# Patient Record
Sex: Male | Born: 1945 | Race: White | Hispanic: No | Marital: Married | Smoking: Never smoker
Health system: Southern US, Community
[De-identification: ages and names within clinical notes are randomized; demographics above are authoritative.]

## PROBLEM LIST (undated history)

## (undated) DIAGNOSIS — M722 Plantar fascial fibromatosis: Secondary | ICD-10-CM

## (undated) DIAGNOSIS — Z8719 Personal history of other diseases of the digestive system: Secondary | ICD-10-CM

## (undated) DIAGNOSIS — Q5310 Unspecified undescended testicle, unilateral: Secondary | ICD-10-CM

## (undated) DIAGNOSIS — M2540 Effusion, unspecified joint: Secondary | ICD-10-CM

## (undated) DIAGNOSIS — M1990 Unspecified osteoarthritis, unspecified site: Secondary | ICD-10-CM

## (undated) DIAGNOSIS — T8859XA Other complications of anesthesia, initial encounter: Secondary | ICD-10-CM

## (undated) DIAGNOSIS — K219 Gastro-esophageal reflux disease without esophagitis: Secondary | ICD-10-CM

## (undated) DIAGNOSIS — I10 Essential (primary) hypertension: Secondary | ICD-10-CM

## (undated) DIAGNOSIS — E119 Type 2 diabetes mellitus without complications: Secondary | ICD-10-CM

## (undated) DIAGNOSIS — I493 Ventricular premature depolarization: Secondary | ICD-10-CM

## (undated) DIAGNOSIS — M2550 Pain in unspecified joint: Secondary | ICD-10-CM

## (undated) DIAGNOSIS — C61 Malignant neoplasm of prostate: Secondary | ICD-10-CM

## (undated) DIAGNOSIS — Z9889 Other specified postprocedural states: Secondary | ICD-10-CM

## (undated) DIAGNOSIS — R112 Nausea with vomiting, unspecified: Secondary | ICD-10-CM

## (undated) DIAGNOSIS — C4490 Unspecified malignant neoplasm of skin, unspecified: Secondary | ICD-10-CM

## (undated) DIAGNOSIS — Z87442 Personal history of urinary calculi: Secondary | ICD-10-CM

## (undated) DIAGNOSIS — T4145XA Adverse effect of unspecified anesthetic, initial encounter: Secondary | ICD-10-CM

## (undated) HISTORY — PX: KNEE ARTHROSCOPY: SUR90

## (undated) HISTORY — PX: OTHER SURGICAL HISTORY: SHX169

## (undated) HISTORY — PX: MOHS SURGERY: SUR867

## (undated) HISTORY — PX: MELANOMA EXCISION: SHX5266

## (undated) HISTORY — PX: SHOULDER ARTHROSCOPY: SHX128

## (undated) HISTORY — PX: ROTATOR CUFF REPAIR: SHX139

## (undated) HISTORY — PX: HERNIA REPAIR: SHX51

---

## 2003-02-17 ENCOUNTER — Ambulatory Visit (HOSPITAL_COMMUNITY): Admission: RE | Admit: 2003-02-17 | Discharge: 2003-02-17 | Payer: Self-pay | Admitting: Orthopedic Surgery

## 2003-02-17 ENCOUNTER — Ambulatory Visit (HOSPITAL_BASED_OUTPATIENT_CLINIC_OR_DEPARTMENT_OTHER): Admission: RE | Admit: 2003-02-17 | Discharge: 2003-02-17 | Payer: Self-pay | Admitting: Orthopedic Surgery

## 2006-11-30 ENCOUNTER — Ambulatory Visit (HOSPITAL_BASED_OUTPATIENT_CLINIC_OR_DEPARTMENT_OTHER): Admission: RE | Admit: 2006-11-30 | Discharge: 2006-11-30 | Payer: Self-pay | Admitting: Orthopedic Surgery

## 2009-03-03 DIAGNOSIS — C4490 Unspecified malignant neoplasm of skin, unspecified: Secondary | ICD-10-CM

## 2009-03-03 HISTORY — DX: Unspecified malignant neoplasm of skin, unspecified: C44.90

## 2010-07-16 NOTE — Op Note (Signed)
Arthur Klein, Arthur Klein               ACCOUNT NO.:  [id number]   MEDICAL RECORD NO.:  [id number]          PATIENT TYPE:  AMB   LOCATION:  DSC                          FACILITY:  [facility]   PHYSICIAN:  Harvie Junior, M.D.   DATE OF BIRTH:  Nov 25, 1945   DATE OF PROCEDURE:  11/30/2006  DATE OF DISCHARGE:                               OPERATIVE REPORT   PREOPERATIVE DIAGNOSIS:  Impingement acromioclavicular joint arthritis  with questionable rotator cuff tear.   POSTOPERATIVE DIAGNOSES:  1. Rotator cuff tear.  2. Impingement.  3. Acromioclavicular joint arthritis.  4. High-grade partial tear of biceps tendon with labral pathology      anterior-superior.   PROCEDURES:  1. Mini-open rotator cuff repair of chronically torn rotator cuff      including acromioplasty.  2. Arthroscopic distal clavicle resection through an anterior portal.  3. Open biceps tenodesis.  4. Arthroscopic debridement of labral pathology and stump of biceps      tendons within the glenohumeral joint.   SURGEON:  Harvie Junior, M.D.   ASSISTANT:  Marshia Ly, P.A.   ANESTHESIA:  General.   BRIEF HISTORY:  Arthur Klein is a 65 year old male with a long history  of having had a previous right rotator cuff repair performed.  He is now  having left shoulder complaints and problems.  We have injected him  several times.  He was having continued complaints of pain and because  of continued complaints of pain in the shoulder, we ultimately discussed  treatment options and the felt that the most appropriate course of  action was going to be an evaluation arthroscopically and fixation as  needed.   DESCRIPTION OF PROCEDURE:  The patient was taken to the operating room  and after adequate anesthesia was obtained with general anesthetic, the  patient was placed supine on the operating table.  The left shoulder was  then prepped and draped in the usual sterile fashion.   Following this, the left shoulder had the  arthroscope placed and routine  examination revealed that there was obvious high-grade fray of the  biceps tendon, just distal to its insertion.  A probe was used and it  was at least 60% of the thickness of the biceps tendon.  At that point,  it was felt that biceps tenodesis was going to be the appropriate course  of action and the biceps tendon was then released within the  glenohumeral joint.  Arthur Klein was used to shave down the stump of the  biceps tendon, as well as the labral pathology, anterior to posterior.  Glenohumeral joint showed no significant arthritic change.  Rotator cuff  undersurface showed rotator cuff tear.  This was debrided back and  obviously was a full-thickness tear.   At this point, attention was turned out of the glenohumeral joint and  into the subacromial space, and anterolateral acromioplasty was  performed from the lateral and posterior compartment.  Distal clavicle  resection over 18 mm was performed from an anterior compartment.  A  thorough debridement of the synovium in the subacromial space was then  undertaken.  Rotator cuff was then evaluated from the topside and noted  to have a tear that was noted from the bottom side.   At this point, the arthroscopic portion of the case was abandoned and a  small lateral incision was made.  Subcutaneous tissue dissected down to  the level of the deltoid.  The deltoid was then divided in line with its  fibers and the rotator cuff tear was identified.  A greater tuberosity  was verified as well as along the articular margin, as well as over in  the area of the biceps tendon and the biceps tendon and rotator cuff  were repaired in a single block anterior to the anterior portion of the  leading edge of the supraspinatus and then 1 anchor placed in the mid  and posterior portion of the supraspinatus.  Excellent repair was  achieved.  There was some lateral cuff tendon, from which we were able  to pull laterally the  rotator cuff to get that held down and tied down  nicely.   At this point, the wound was copiously irrigated and suctioned dry.  The  arm was then put through a range of motion of about 50 degrees of  external rotation, 180 degrees of elevation, internal rotation was 90  degrees with the arm at the side.  At this point, the final check was  made of our repair, after putting through the range of motion and no  tendency towards weakness or tension.  At this point, the deltoid was  close with a 1-Vicryl running, the skin with 0 and 2-0 Vicryl and 3-0  Maxon pullout suture.  Benzoin and Steri-Strips were applied, sterile  compression dressing was applied.  The patient was taken to recovery,  where he was noted to be in satisfactory condition.  Estimated blood  loss for the procedure was none.      Harvie Junior, M.D.  Electronically Signed     JLG/MEDQ  D:  11/30/2006  T:  12/01/2006  Job:  [id number]

## 2010-07-19 NOTE — Op Note (Signed)
NAMEAVROM, ROBARTS                           ACCOUNT NO.:  [id number]   MEDICAL RECORD NO.:  [id number]                   PATIENT TYPE:  AMB   LOCATION:  DSC                                  FACILITY:  [facility]   PHYSICIAN:  Harvie Junior, M.D.                DATE OF BIRTH:  September 14, 1945   DATE OF PROCEDURE:  02/17/2003  DATE OF DISCHARGE:                                 OPERATIVE REPORT   PREOPERATIVE DIAGNOSIS:  Medial meniscal tear right knee.   POSTOPERATIVE DIAGNOSES:  1. Medial meniscal tear.  2. 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   PROCEDURE:  1. Partial posterior horn medical meniscectomy.  2. Debridement of 1 cm femoral condylar defect grade 4.  3. Debridement of patellofemoral degenerative joint disease.   SURGEON:  Harvie Junior, M.D.   ASSISTANT:  Marshia Ly, P.A.   ANESTHESIA:  General.   BRIEF HISTORY:  He is a 65 year old male with a long history of having  popping and catching in his right knee with pain on the medial side  predominately.  He is ultimately treated conservatively, but after failure  of conservative care is ultimately taken to the operating room for operative  knee arthroscopy.   DESCRIPTION OF PROCEDURE:  The patient was taken to the operating room and  after adequate anesthesia obtained with a general anesthetic the patient was  placed on the operating table and the right leg was prepped and draped in  the usual sterile fashion.  Following this routine arthroscopic examination  of the right knee revealed that there was an obvious posterior horn medial  meniscal tear.  This was debrided back to a smooth and stable rim, about 20%  of the posterior horn and of the meniscus was removed.   Following this, attention was turned towards the medial femoral condyle  which was noted to have some grade 3 changes.  This was debrided with a  suction shaver back to a smooth and  stable rim.   Attention was turned to the anterior cruciate which is normal.  Lateral side  showed the meniscus within normal limits.  There was a large, free flap of  articular cartilage.  This measured to be about 1 cm. There was a large free  flap of articular cartilage.  This measured to be about 1 cm square.  This  was debrided with a suction shaver back to a smooth and stable rim.   Following this, attention was turned up into the patellofemoral joint where  a minimal debridement was undertaken of the patella.  Attention was turned  back to the medial side where a final check was made for any loose or  fragmented pieces.  Seeing none, the knee was copiously irrigated and  suctioned dry.  The arthroscopic portal was  closed with a bandage.  A sterile compression  dressing was applied and the  patient was taken to the recovery room; and was noted to be in satisfactory  condition.   ESTIMATED BLOOD LOSS:  None.                                               Harvie Junior, M.D.    Ranae Plumber  D:  02/17/2003  T:  02/19/2003  Job:  [id number]

## 2010-12-12 LAB — POCT HEMOGLOBIN-HEMACUE: Operator id: 208731

## 2011-04-07 ENCOUNTER — Other Ambulatory Visit: Payer: Self-pay | Admitting: Orthopedic Surgery

## 2011-04-09 ENCOUNTER — Other Ambulatory Visit: Payer: Self-pay | Admitting: Orthopedic Surgery

## 2011-04-21 ENCOUNTER — Encounter (HOSPITAL_COMMUNITY): Payer: Self-pay

## 2011-04-25 ENCOUNTER — Other Ambulatory Visit (HOSPITAL_COMMUNITY): Payer: Self-pay

## 2011-04-28 ENCOUNTER — Encounter (HOSPITAL_COMMUNITY)
Admission: RE | Admit: 2011-04-28 | Discharge: 2011-04-28 | Disposition: A | Discharge status: Home / Self Care | Payer: 59 | Source: Ambulatory Visit | Attending: Orthopedic Surgery | Admitting: Orthopedic Surgery

## 2011-04-28 ENCOUNTER — Encounter (HOSPITAL_COMMUNITY): Payer: Self-pay

## 2011-04-28 ENCOUNTER — Other Ambulatory Visit: Payer: Self-pay

## 2011-04-28 HISTORY — DX: Personal history of other diseases of the digestive system: Z87.19

## 2011-04-28 HISTORY — DX: Pain in unspecified joint: M25.50

## 2011-04-28 HISTORY — DX: Other complications of anesthesia, initial encounter: T88.59XA

## 2011-04-28 HISTORY — DX: Adverse effect of unspecified anesthetic, initial encounter: T41.45XA

## 2011-04-28 HISTORY — DX: Essential (primary) hypertension: I10

## 2011-04-28 HISTORY — DX: Unspecified osteoarthritis, unspecified site: M19.90

## 2011-04-28 HISTORY — DX: Effusion, unspecified joint: M25.40

## 2011-04-28 LAB — URINALYSIS, ROUTINE W REFLEX MICROSCOPIC
Glucose, UA: NEGATIVE mg/dL
Hgb urine dipstick: NEGATIVE
Leukocytes, UA: NEGATIVE
Specific Gravity, Urine: 1.023 (ref 1.005–1.030)
Urobilinogen, UA: 0.2 mg/dL (ref 0.0–1.0)

## 2011-04-28 LAB — COMPREHENSIVE METABOLIC PANEL
Albumin: 4.4 g/dL (ref 3.5–5.2)
BUN: 10 mg/dL (ref 6–23)
Calcium: 10 mg/dL (ref 8.4–10.5)
Creatinine, Ser: 0.76 mg/dL (ref 0.50–1.35)
Total Protein: 8.2 g/dL (ref 6.0–8.3)

## 2011-04-28 LAB — TYPE AND SCREEN: Antibody Screen: NEGATIVE

## 2011-04-28 LAB — CBC
HCT: 46.5 % (ref 39.0–52.0)
MCHC: 35.9 g/dL (ref 30.0–36.0)
MCV: 85.5 fL (ref 78.0–100.0)
RDW: 13.2 % (ref 11.5–15.5)

## 2011-04-28 LAB — DIFFERENTIAL
Eosinophils Relative: 2 % (ref 0–5)
Lymphocytes Relative: 39 % (ref 12–46)
Lymphs Abs: 2.7 10*3/uL (ref 0.7–4.0)
Monocytes Absolute: 0.5 10*3/uL (ref 0.1–1.0)

## 2011-04-28 LAB — SURGICAL PCR SCREEN
MRSA, PCR: NEGATIVE
Staphylococcus aureus: NEGATIVE

## 2011-04-28 NOTE — Pre-Procedure Instructions (Signed)
20 Arthur Klein  04/28/2011   Your procedure is scheduled on:  Fri, Mar 1 @ 1040  Report to [facility] at 0830 AM.  Call this number if you have problems the morning of surgery: [phone]   Remember:   Do not eat food:After Midnight.  May have clear liquids: up to 4 Hours before arrival.(until 4:30 am)  Clear liquids include soda, tea, black coffee, apple or grape juice, broth.  Take these medicines the morning of surgery with A SIP OF WATER:    Do not wear jewelry, make-up or nail polish.  Do not wear lotions, powders, or perfumes. You may wear deodorant.  Do not shave 48 hours prior to surgery.  Do not bring valuables to the hospital.  Contacts, dentures or bridgework may not be worn into surgery.  Leave suitcase in the car. After surgery it may be brought to your room.  For patients admitted to the hospital, checkout time is 11:00 AM the day of discharge.   Patients discharged the day of surgery will not be allowed to drive home.  Name and phone number of your driver:   Special Instructions: CHG Shower Use Special Wash: 1/2 bottle night before surgery and 1/2 bottle morning of surgery.   Please read over the following fact sheets that you were given: Pain Booklet, Coughing and Deep Breathing, Blood Transfusion Information, Total Joint Packet, MRSA Information and Surgical Site Infection Prevention

## 2011-04-28 NOTE — Progress Notes (Signed)
Pt doesn't have a cardiologist and has never had an echo/stress test/heart cath 

## 2011-05-01 MED ORDER — POVIDONE-IODINE 7.5 % EX SOLN
Freq: Once | CUTANEOUS | Status: DC
  Filled 2011-05-01: qty 118

## 2011-05-01 MED ORDER — CEFAZOLIN SODIUM-DEXTROSE 2-3 GM-% IV SOLR
2.0000 g | INTRAVENOUS | Status: AC
  Administered 2011-05-02: 2 g via INTRAVENOUS
  Filled 2011-05-01: qty 50

## 2011-05-02 ENCOUNTER — Ambulatory Visit (HOSPITAL_COMMUNITY): Payer: 59 | Admitting: Anesthesiology

## 2011-05-02 ENCOUNTER — Encounter (HOSPITAL_COMMUNITY): Payer: Self-pay | Admitting: Anesthesiology

## 2011-05-02 ENCOUNTER — Encounter (HOSPITAL_COMMUNITY): Payer: Self-pay | Admitting: *Deleted

## 2011-05-02 ENCOUNTER — Inpatient Hospital Stay (HOSPITAL_COMMUNITY)
Admission: RE | Admit: 2011-05-02 | Discharge: 2011-05-05 | DRG: 470 | Disposition: A | Discharge status: Home Health | Payer: 59 | Source: Ambulatory Visit | Attending: Orthopedic Surgery | Admitting: Orthopedic Surgery

## 2011-05-02 ENCOUNTER — Encounter (HOSPITAL_COMMUNITY)
Admission: RE | Disposition: A | Discharge status: Home Health | Payer: Self-pay | Source: Ambulatory Visit | Attending: Orthopedic Surgery

## 2011-05-02 DIAGNOSIS — Z883 Allergy status to other anti-infective agents status: Secondary | ICD-10-CM

## 2011-05-02 DIAGNOSIS — Z888 Allergy status to other drugs, medicaments and biological substances status: Secondary | ICD-10-CM

## 2011-05-02 DIAGNOSIS — M1711 Unilateral primary osteoarthritis, right knee: Secondary | ICD-10-CM | POA: Diagnosis present

## 2011-05-02 DIAGNOSIS — M1710 Unilateral primary osteoarthritis, unspecified knee: Principal | ICD-10-CM | POA: Diagnosis present

## 2011-05-02 DIAGNOSIS — Z01812 Encounter for preprocedural laboratory examination: Secondary | ICD-10-CM

## 2011-05-02 HISTORY — PX: KNEE ARTHROPLASTY: SHX992

## 2011-05-02 SURGERY — ARTHROPLASTY, KNEE, TOTAL, USING IMAGELESS COMPUTER-ASSISTED NAVIGATION
Anesthesia: General | Site: Knee | Laterality: Right | Wound class: Clean

## 2011-05-02 MED ORDER — ONDANSETRON HCL 4 MG/2ML IJ SOLN: 4.0000 mg | Freq: Once | INTRAMUSCULAR | Status: DC | PRN

## 2011-05-02 MED ORDER — KETOROLAC TROMETHAMINE 0.5 % OP SOLN
OPHTHALMIC | Status: DC | PRN
  Administered 2011-05-02: 30 [drp] via OPHTHALMIC

## 2011-05-02 MED ORDER — WARFARIN SODIUM 7.5 MG PO TABS
7.5000 mg | ORAL_TABLET | Freq: Once | ORAL | Status: AC
  Administered 2011-05-02: 7.5 mg via ORAL
  Filled 2011-05-02 (×2): qty 1

## 2011-05-02 MED ORDER — MEPERIDINE HCL 25 MG/ML IJ SOLN: 6.2500 mg | INTRAMUSCULAR | Status: DC | PRN

## 2011-05-02 MED ORDER — HYDROMORPHONE 0.3 MG/ML IV SOLN: INTRAVENOUS | Status: DC

## 2011-05-02 MED ORDER — MORPHINE SULFATE 4 MG/ML IJ SOLN
0.0500 mg/kg | INTRAMUSCULAR | Status: DC | PRN
Start: 2011-05-02 — End: 2011-05-02

## 2011-05-02 MED ORDER — METHOCARBAMOL 500 MG PO TABS
500.0000 mg | ORAL_TABLET | Freq: Four times a day (QID) | ORAL | Status: DC | PRN
  Administered 2011-05-02 – 2011-05-05 (×6): 500 mg via ORAL
  Filled 2011-05-02 (×5): qty 1

## 2011-05-02 MED ORDER — NALOXONE HCL 0.4 MG/ML IJ SOLN: 0.4000 mg | INTRAMUSCULAR | Status: DC | PRN

## 2011-05-02 MED ORDER — DIPHENHYDRAMINE HCL 50 MG/ML IJ SOLN: 12.5000 mg | Freq: Four times a day (QID) | INTRAMUSCULAR | Status: DC | PRN

## 2011-05-02 MED ORDER — ALUM & MAG HYDROXIDE-SIMETH 200-200-20 MG/5ML PO SUSP: 30.0000 mL | ORAL | Status: DC | PRN

## 2011-05-02 MED ORDER — SODIUM CHLORIDE 0.9 % IJ SOLN: 9.0000 mL | INTRAMUSCULAR | Status: DC | PRN

## 2011-05-02 MED ORDER — FENTANYL CITRATE 0.05 MG/ML IJ SOLN: 50.0000 ug | INTRAMUSCULAR | Status: DC | PRN

## 2011-05-02 MED ORDER — CEFUROXIME SODIUM 1.5 G IJ SOLR
INTRAMUSCULAR | Status: DC | PRN
  Administered 2011-05-02: 1.5 g

## 2011-05-02 MED ORDER — ONDANSETRON HCL 4 MG PO TABS
4.0000 mg | ORAL_TABLET | Freq: Four times a day (QID) | ORAL | Status: DC | PRN
  Administered 2011-05-05: 4 mg via ORAL
  Filled 2011-05-02: qty 1

## 2011-05-02 MED ORDER — ACETAMINOPHEN 10 MG/ML IV SOLN
INTRAVENOUS | Status: DC | PRN
  Administered 2011-05-02: 1000 mg via INTRAVENOUS

## 2011-05-02 MED ORDER — FENTANYL CITRATE 0.05 MG/ML IJ SOLN
INTRAMUSCULAR | Status: DC | PRN
Start: 2011-05-02 — End: 2011-05-02
  Administered 2011-05-02 (×3): 50 ug via INTRAVENOUS
  Administered 2011-05-02: 100 ug via INTRAVENOUS

## 2011-05-02 MED ORDER — SODIUM CHLORIDE 0.9 % IR SOLN
Status: DC | PRN
  Administered 2011-05-02: 3000 mL
  Administered 2011-05-02: 1000 mL

## 2011-05-02 MED ORDER — FENTANYL CITRATE 0.05 MG/ML IJ SOLN
INTRAMUSCULAR | Status: AC
  Filled 2011-05-02: qty 2

## 2011-05-02 MED ORDER — HYDROMORPHONE HCL PF 1 MG/ML IJ SOLN
0.2500 mg | INTRAMUSCULAR | Status: DC | PRN
  Administered 2011-05-02: 0.5 mg via INTRAVENOUS

## 2011-05-02 MED ORDER — ZOLPIDEM TARTRATE 5 MG PO TABS: 5.0000 mg | ORAL_TABLET | Freq: Every evening | ORAL | Status: DC | PRN

## 2011-05-02 MED ORDER — LACTATED RINGERS IV SOLN
INTRAVENOUS | Status: DC
Start: 2011-05-02 — End: 2011-05-02
  Administered 2011-05-02: 09:00:00 via INTRAVENOUS

## 2011-05-02 MED ORDER — ARTIFICIAL TEARS OP OINT
TOPICAL_OINTMENT | OPHTHALMIC | Status: DC | PRN
  Administered 2011-05-02: 1 via OPHTHALMIC

## 2011-05-02 MED ORDER — ACETAMINOPHEN 10 MG/ML IV SOLN
1000.0000 mg | Freq: Four times a day (QID) | INTRAVENOUS | Status: AC
  Administered 2011-05-02 – 2011-05-03 (×3): 1000 mg via INTRAVENOUS
  Filled 2011-05-02 (×4): qty 100

## 2011-05-02 MED ORDER — DEXTROSE-NACL 5-0.45 % IV SOLN
INTRAVENOUS | Status: DC
  Administered 2011-05-02: 1000 mL via INTRAVENOUS

## 2011-05-02 MED ORDER — ACETAMINOPHEN 10 MG/ML IV SOLN
INTRAVENOUS | Status: AC
  Filled 2011-05-02: qty 100

## 2011-05-02 MED ORDER — FENTANYL CITRATE 0.05 MG/ML IJ SOLN
50.0000 ug | INTRAMUSCULAR | Status: DC | PRN
  Administered 2011-05-02: 100 ug via INTRAVENOUS

## 2011-05-02 MED ORDER — OXYCODONE HCL 5 MG PO TABS
5.0000 mg | ORAL_TABLET | ORAL | Status: DC | PRN
  Administered 2011-05-02: 10 mg via ORAL
  Administered 2011-05-04 (×2): 5 mg via ORAL
  Filled 2011-05-02 (×2): qty 1

## 2011-05-02 MED ORDER — PATIENT'S GUIDE TO USING COUMADIN BOOK
Freq: Once | Status: DC
  Filled 2011-05-02: qty 1

## 2011-05-02 MED ORDER — OXYCODONE HCL 5 MG PO TABS
ORAL_TABLET | ORAL | Status: AC
  Filled 2011-05-02: qty 2

## 2011-05-02 MED ORDER — MIDAZOLAM HCL 2 MG/2ML IJ SOLN
INTRAMUSCULAR | Status: AC
  Filled 2011-05-02: qty 2

## 2011-05-02 MED ORDER — LACTATED RINGERS IV SOLN
INTRAVENOUS | Status: DC | PRN
  Administered 2011-05-02 (×2): via INTRAVENOUS

## 2011-05-02 MED ORDER — VITAMIN D3 25 MCG (1000 UNIT) PO TABS
4000.0000 [IU] | ORAL_TABLET | Freq: Every day | ORAL | Status: DC
  Administered 2011-05-03 – 2011-05-05 (×3): 4000 [IU] via ORAL
  Filled 2011-05-02 (×4): qty 4

## 2011-05-02 MED ORDER — METHOCARBAMOL 100 MG/ML IJ SOLN
500.0000 mg | Freq: Four times a day (QID) | INTRAVENOUS | Status: DC | PRN
  Filled 2011-05-02: qty 5

## 2011-05-02 MED ORDER — MIDAZOLAM HCL 5 MG/5ML IJ SOLN
INTRAMUSCULAR | Status: DC | PRN
  Administered 2011-05-02: 2 mg via INTRAVENOUS

## 2011-05-02 MED ORDER — MORPHINE SULFATE 2 MG/ML IJ SOLN: 0.0500 mg/kg | INTRAMUSCULAR | Status: DC | PRN

## 2011-05-02 MED ORDER — DEXAMETHASONE SODIUM PHOSPHATE 4 MG/ML IJ SOLN
INTRAMUSCULAR | Status: DC | PRN
  Administered 2011-05-02: 8 mg via INTRAVENOUS

## 2011-05-02 MED ORDER — FERROUS SULFATE 325 (65 FE) MG PO TABS
325.0000 mg | ORAL_TABLET | Freq: Two times a day (BID) | ORAL | Status: DC
  Administered 2011-05-02 – 2011-05-04 (×5): 325 mg via ORAL
  Filled 2011-05-02 (×9): qty 1

## 2011-05-02 MED ORDER — DIPHENHYDRAMINE HCL 12.5 MG/5ML PO ELIX: 12.5000 mg | ORAL_SOLUTION | Freq: Four times a day (QID) | ORAL | Status: DC | PRN

## 2011-05-02 MED ORDER — WARFARIN VIDEO: Freq: Once | Status: DC

## 2011-05-02 MED ORDER — PROPOFOL 10 MG/ML IV EMUL
INTRAVENOUS | Status: DC | PRN
  Administered 2011-05-02: 200 mg via INTRAVENOUS

## 2011-05-02 MED ORDER — PHENYLEPHRINE HCL 10 MG/ML IJ SOLN
INTRAMUSCULAR | Status: DC | PRN
  Administered 2011-05-02 (×3): 40 ug via INTRAVENOUS

## 2011-05-02 MED ORDER — CEFAZOLIN SODIUM-DEXTROSE 2-3 GM-% IV SOLR
2.0000 g | Freq: Four times a day (QID) | INTRAVENOUS | Status: AC
  Administered 2011-05-02 – 2011-05-03 (×3): 2 g via INTRAVENOUS
  Filled 2011-05-02 (×5): qty 50

## 2011-05-02 MED ORDER — ONDANSETRON HCL 4 MG/2ML IJ SOLN: 4.0000 mg | Freq: Four times a day (QID) | INTRAMUSCULAR | Status: DC | PRN

## 2011-05-02 MED ORDER — PROMETHAZINE HCL 25 MG/ML IJ SOLN: 12.5000 mg | Freq: Four times a day (QID) | INTRAMUSCULAR | Status: DC | PRN

## 2011-05-02 MED ORDER — METHOCARBAMOL 500 MG PO TABS
ORAL_TABLET | ORAL | Status: AC
  Administered 2011-05-04: 500 mg via ORAL
  Filled 2011-05-02: qty 1

## 2011-05-02 MED ORDER — DOCUSATE SODIUM 100 MG PO CAPS
100.0000 mg | ORAL_CAPSULE | Freq: Two times a day (BID) | ORAL | Status: DC
  Administered 2011-05-02 – 2011-05-05 (×6): 100 mg via ORAL
  Filled 2011-05-02 (×9): qty 1

## 2011-05-02 MED ORDER — MIDAZOLAM HCL 2 MG/2ML IJ SOLN
1.0000 mg | INTRAMUSCULAR | Status: DC | PRN
  Administered 2011-05-02: 2 mg via INTRAVENOUS

## 2011-05-02 MED ORDER — BUPIVACAINE HCL (PF) 0.5 % IJ SOLN
INTRAMUSCULAR | Status: DC | PRN
  Administered 2011-05-02: 30 mL

## 2011-05-02 MED ORDER — METOCLOPRAMIDE HCL 5 MG/ML IJ SOLN
INTRAMUSCULAR | Status: DC | PRN
  Administered 2011-05-02: 10 mg via INTRAVENOUS

## 2011-05-02 MED ORDER — ONDANSETRON HCL 4 MG/2ML IJ SOLN
INTRAMUSCULAR | Status: DC | PRN
  Administered 2011-05-02: 4 mg via INTRAVENOUS

## 2011-05-02 MED ORDER — VITAMIN D 50 MCG (2000 UT) PO CAPS: 4000.0000 [IU] | ORAL_CAPSULE | Freq: Every day | ORAL | Status: DC

## 2011-05-02 SURGICAL SUPPLY — 64 items
BANDAGE ELASTIC 4 VELCRO ST LF (GAUZE/BANDAGES/DRESSINGS) ×3 IMPLANT
BANDAGE ELASTIC 6 VELCRO ST LF (GAUZE/BANDAGES/DRESSINGS) ×3 IMPLANT
BANDAGE ESMARK 6X9 LF (GAUZE/BANDAGES/DRESSINGS) ×2 IMPLANT
BENZOIN TINCTURE PRP APPL 2/3 (GAUZE/BANDAGES/DRESSINGS) ×3 IMPLANT
BLADE SAGITTAL 25.0X1.19X90 (BLADE) ×3 IMPLANT
BLADE SAW SAG 90X13X1.27 (BLADE) ×3 IMPLANT
BNDG ESMARK 6X9 LF (GAUZE/BANDAGES/DRESSINGS) ×3
BOWL SMART MIX CTS (DISPOSABLE) ×3 IMPLANT
CEMENT HV SMART SET (Cement) ×6 IMPLANT
CLOTH BEACON ORANGE TIMEOUT ST (SAFETY) ×3 IMPLANT
COVER BACK TABLE 24X17X13 BIG (DRAPES) IMPLANT
COVER SURGICAL LIGHT HANDLE (MISCELLANEOUS) ×3 IMPLANT
CUFF TOURNIQUET SINGLE 34IN LL (TOURNIQUET CUFF) ×3 IMPLANT
CUFF TOURNIQUET SINGLE 44IN (TOURNIQUET CUFF) IMPLANT
DRAPE EXTREMITY T 121X128X90 (DRAPE) ×3 IMPLANT
DRAPE U-SHAPE 47X51 STRL (DRAPES) ×3 IMPLANT
DRSG PAD ABDOMINAL 8X10 ST (GAUZE/BANDAGES/DRESSINGS) ×3 IMPLANT
DURAPREP 26ML APPLICATOR (WOUND CARE) ×3 IMPLANT
ELECT REM PT RETURN 9FT ADLT (ELECTROSURGICAL) ×3
ELECTRODE REM PT RTRN 9FT ADLT (ELECTROSURGICAL) ×2 IMPLANT
EVACUATOR 1/8 PVC DRAIN (DRAIN) ×3 IMPLANT
FACESHIELD LNG OPTICON STERILE (SAFETY) ×3 IMPLANT
GAUZE SPONGE 4X4 12PLY STRL LF (GAUZE/BANDAGES/DRESSINGS) ×3 IMPLANT
GAUZE XEROFORM 5X9 LF (GAUZE/BANDAGES/DRESSINGS) ×3 IMPLANT
GLOVE BIOGEL PI IND STRL 8 (GLOVE) ×4 IMPLANT
GLOVE BIOGEL PI INDICATOR 8 (GLOVE) ×2
GLOVE ECLIPSE 7.5 STRL STRAW (GLOVE) ×6 IMPLANT
GOWN PREVENTION PLUS LG XLONG (DISPOSABLE) IMPLANT
GOWN STRL NON-REIN LRG LVL3 (GOWN DISPOSABLE) ×3 IMPLANT
GOWN STRL REIN XL XLG (GOWN DISPOSABLE) ×6 IMPLANT
HANDPIECE INTERPULSE COAX TIP (DISPOSABLE) ×1
HOOD PEEL AWAY FACE SHEILD DIS (HOOD) ×9 IMPLANT
IMMOBILIZER KNEE 20 (SOFTGOODS)
IMMOBILIZER KNEE 20 THIGH 36 (SOFTGOODS) IMPLANT
IMMOBILIZER KNEE 22 UNIV (SOFTGOODS) ×3 IMPLANT
IMMOBILIZER KNEE 24 THIGH 36 (MISCELLANEOUS) IMPLANT
IMMOBILIZER KNEE 24 UNIV (MISCELLANEOUS)
KIT BASIN OR (CUSTOM PROCEDURE TRAY) ×3 IMPLANT
KIT ROOM TURNOVER OR (KITS) ×3 IMPLANT
MANIFOLD NEPTUNE II (INSTRUMENTS) ×3 IMPLANT
NEEDLE HYPO 25GX1X1/2 BEV (NEEDLE) IMPLANT
NS IRRIG 1000ML POUR BTL (IV SOLUTION) ×3 IMPLANT
PACK TOTAL JOINT (CUSTOM PROCEDURE TRAY) ×3 IMPLANT
PAD ARMBOARD 7.5X6 YLW CONV (MISCELLANEOUS) ×3 IMPLANT
PAD CAST 4YDX4 CTTN HI CHSV (CAST SUPPLIES) ×2 IMPLANT
PADDING CAST COTTON 4X4 STRL (CAST SUPPLIES) ×1
PADDING WEBRIL 4 STERILE (GAUZE/BANDAGES/DRESSINGS) ×3 IMPLANT
PADDING WEBRIL 6 STERILE (GAUZE/BANDAGES/DRESSINGS) ×3 IMPLANT
SET HNDPC FAN SPRY TIP SCT (DISPOSABLE) ×2 IMPLANT
SPONGE GAUZE 4X4 12PLY (GAUZE/BANDAGES/DRESSINGS) ×3 IMPLANT
STAPLER VISISTAT 35W (STAPLE) IMPLANT
STRIP CLOSURE SKIN 1/2X4 (GAUZE/BANDAGES/DRESSINGS) ×3 IMPLANT
SUCTION FRAZIER TIP 10 FR DISP (SUCTIONS) ×3 IMPLANT
SUT MON AB 3-0 SH 27 (SUTURE)
SUT MON AB 3-0 SH27 (SUTURE) IMPLANT
SUT VIC AB 0 CTB1 27 (SUTURE) ×6 IMPLANT
SUT VIC AB 1 CT1 27 (SUTURE) ×2
SUT VIC AB 1 CT1 27XBRD ANBCTR (SUTURE) ×4 IMPLANT
SUT VIC AB 2-0 CTB1 (SUTURE) ×6 IMPLANT
SYR CONTROL 10ML LL (SYRINGE) IMPLANT
TOWEL OR 17X24 6PK STRL BLUE (TOWEL DISPOSABLE) ×3 IMPLANT
TOWEL OR 17X26 10 PK STRL BLUE (TOWEL DISPOSABLE) ×3 IMPLANT
TRAY FOLEY CATH 14FR (SET/KITS/TRAYS/PACK) ×3 IMPLANT
WATER STERILE IRR 1000ML POUR (IV SOLUTION) ×9 IMPLANT

## 2011-05-02 NOTE — Progress Notes (Signed)
During block pt placed on continous heart monitor and pulse ox. Heart rate maintained rate 88, Pulse ox. 97% on 2 lpm oxygen via [place]. Monitor did not link VS

## 2011-05-02 NOTE — Preoperative (Signed)
Beta Blockers   Reason not to administer Beta Blockers:Not Applicable 

## 2011-05-02 NOTE — H&P (Signed)
PREOPERATIVE H&P  Chief Complaint: r. Knee pain  HPI: Arthur Klein is a 66 y.o. male who presents for evaluation of R. Knee pain. It has been present for greater than 1 year and has been worsening.He has bone on bone changes on x-ray and pervious knee scope shows large area of exposed bone.He has failed conservative measures. Pain is rated as moderate.  Past Medical History  Diagnosis Date  . Gout     hx of  . Hypertension     borderline HTNt  . Headache     occasionally  . Arthritis   . Joint pain   . Joint swelling   . H/O hiatal hernia   . Complication of anesthesia     doesn't wake up well,combative until wife talks with him   Past Surgical History  Procedure Date  . Rotator cuff repair     right  . Rotator cuff repair     left  . Knee arthroscopy     right  . Shoulder arthroscopy     left  . Melanoma excision     stage 3+ lower left lumbar region with axillary node removal  . 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 Marital Status: Married    Spouse Name: N/A    Number of Children: N/A  . Years of Education: N/A   Social History Main Topics  . Smoking status: None  . Smokeless tobacco: Current User    Types: Chew  . Alcohol Use: No  . Drug Use: No  . Sexually Active: Yes   Other Topics Concern  . None   Social History Narrative  . None   Family History  Problem Relation Age of Onset  . Anesthesia problems Neg Hx   . Hypotension Neg Hx   . Malignant hyperthermia Neg Hx   . Pseudochol deficiency Neg Hx    Allergies  Allergen Reactions  . Erythrocin Diarrhea  . Nsaids Other (See Comments)    Gi bleeding  . Other Other (See Comments)    Pain meds with a NSAID derivative   Prior to Admission medications   Medication Sig Start Date End Date Taking? Authorizing Provider  acetaminophen (TYLENOL) 500 MG tablet Take 1,000 mg by mouth every 6 (six) hours as needed. For pain   Yes Historical  Provider, MD  Bacillus Coagulans-Inulin (PROBIOTIC-PREBIOTIC) 1-250 BILLION-MG CAPS Take 1 capsule by mouth daily.   Yes Historical Provider, MD  Cholecalciferol (VITAMIN D) 2000 UNITS CAPS Take 4,000 Units by mouth daily.   Yes Historical Provider, MD  OVER THE COUNTER MEDICATION Take 1 tablet by mouth daily. Juice Plus   Yes Historical Provider, MD     Positive ROS: none  All other systems have been reviewed and were otherwise negative with the exception of those mentioned in the HPI and as above.  Physical Exam: Filed Vitals:   05/02/11 0742  BP: 145/89  Pulse: 77  Temp: 98.4 F (36.9 C)  Resp: 20    General: Alert, no acute distress Cardiovascular: No pedal edema Respiratory: No cyanosis, no use of accessory musculature GI: No organomegaly, abdomen is soft and non-tender Skin: No lesions in the area of chief complaint Neurologic: Sensation intact distally Psychiatric: Patient is competent for consent with normal mood and affect Lymphatic: No axillary or cervical lymphadenopathy  MUSCULOSKELETAL: r. Knee pain on rom/ no instability/ med joint line seevere pain to palp  Assessment/Plan: degenerative joint disease Plan for Procedure(s): TOTAL KNEE ARTHROPLASTY with computer assistance The risks benefits and alternatives were discussed with the patient including but not limited to the risks of nonoperative treatment, versus surgical intervention including infection, bleeding, nerve injury, malunion, nonunion, hardware prominence, hardware failure, need for hardware removal, blood clots, cardiopulmonary complications, morbidity, mortality, among others, and they were willing to proceed.  Predicted outcome is good, although there will be at least a six to nine month expected recovery.  Arthur Willmon L, MD 05/02/2011 9:14 AM

## 2011-05-02 NOTE — Op Note (Signed)
Arthur Klein, Arthur Klein NO.:  [id number]  MEDICAL RECORD NO.:  [id number]  LOCATION:  5001                         FACILITY:  [facility]  PHYSICIAN:  Harvie Junior, M.D.   DATE OF BIRTH:  12-09-1945  DATE OF PROCEDURE:  05/02/2011 DATE OF DISCHARGE:                              OPERATIVE REPORT   PREOPERATIVE DIAGNOSIS:  End-stage degenerative joint disease, right knee.  POSTOPERATIVE DIAGNOSIS:  End-stage degenerative joint disease, right knee.  PROCEDURE:  Right total knee replacement with a Sigma system, size 4 femur, size 5 tibia, 15 mm bridging bearing, and a 38 mm all- polyethylene patella.  SURGEON:  Harvie Junior, M.D.  ASSISTANT:  Marshia Ly, P.A.  ANESTHESIA:  General.  BRIEF HISTORY:  Mr. Sayres is a 66 year old male with a long history of having significant complaints of right knee pain.  He had been treated conservatively for a long period time and was evaluated and noted to have bone-on-bone changes and continued to have pain.  Because of continued complaints of pain, the patient was taken to the operating room after failure of all conservative care.  He had previous arthroscopy, failure of injection therapy, failure of viscosupplementation.  Because of failure of all this conservative care, he was ultimately taken to the operating room for right total knee replacement.  Because of his young age, we felt the computer assistance would be appropriate and this was chosen to be used preoperatively.  DESCRIPTION OF PROCEDURE:  The patient was taken to the operating room. After adequate anesthesia was obtained with general anesthetic, the patient was placed supine on the operating table.  He was prepped and draped in usual sterile fashion.  Following this, the leg was exsanguinated.  Blood pressure tourniquet was inflated to 350 mmHg. Following this, a midline incision was made in the subcutaneous tissue down to the level of extensor mechanism  and a medial parapatellar arthrotomy was undertaken.  Once this was completed, attention was turned to the right knee where anterior and posterior cruciates were removed, mediolateral meniscus, retropatellar fat pad, and synovium in the anterior aspect of the femur.  Once that was completed, attention was turned to the knee where 2 pins were placed in the tibia, 2 pins in the femur, and the computer assistance modules were placed.  Once this was done, the registration process was undertaken.  This adds 30 minutes to surgical procedure.  Once the registration process was completed, the tibia was then cut perpendicular to its long axis.  The femur was cut perpendicular to the anatomic axis and this was done under computer assistance with perfect neutral long alignment achieved at this point. Attention at this point was then turned towards the femur, which was sized to about a 4-1/2.  We felt that 5 was going to be the tibial side, but felt that 5 maybe too big in the flexion gap and at that point, I felt that a size 4 will be appropriate.  The size 4 was then chosen and the anterior-posterior cuts were made, chamfers and box.  Attention was turned to the tibia, sized to a 5, drilled and keeled.  The trial components were put in  place, size 5 tibia, size 4 femur, 15 mm bridging bearing was placed which gave a perfect neutral long alignment, gap balance.  Attention was turned to the patella, cut down to the level of 13 mm, and a 38 mm paddle was used and a 38 mm patella was put in place, perfect neutral long alignment at this point with perfect patellar tracking.  Once this was done, all trial components were removed.  The knee was then copiously and thoroughly lavaged and suctioned dry.  The knee was then dried thoroughly, and the final components were cemented into place, size 4 femur, size 5 tibia, 15 mm bridging bearing trial was placed, and a 38 mm all poly-patella was placed with a  clamp.  Once this was done, all excess bone cement was removed.  Cement was allowed to harden.  Once the cement was completely hardened, attention was turned towards the femur where the tourniquet was let down.  The trial poly was removed.  Bleeding was controlled in the back of the knee as well as throughout the knee.  The knee was again thoroughly irrigated and suctioned dry.  The final poly was placed.  Again alignment was checked with the computer and the computer assistance modules were removed.  At this point, perfect neutral long alignment and gap balance.  A medium Hemovac drain was placed.  The medial parapatellar arthrotomy was closed with 1 Vicryl running, skin with 0 and 2-0 Vicryl and 3-0 Monocryl subcuticular.  Benzoin and Steri-Strips were applied.  Sterile compressive dressing was applied.  The patient was taken to recovery room where he was noted to be in satisfactory condition.  Estimated blood loss for this procedure was less than 50 mL.     Harvie Junior, M.D.     Ranae Plumber  D:  05/02/2011  T:  05/02/2011  Job:  [id number]

## 2011-05-02 NOTE — Anesthesia Postprocedure Evaluation (Signed)
Anesthesia Post Note  Patient: Arthur Klein  Procedure(s) Performed: Procedure(s) (LRB): COMPUTER ASSISTED TOTAL KNEE ARTHROPLASTY (Right)  Anesthesia type: general  Patient location: PACU  Post pain: Pain level controlled  Post assessment: Patient's Cardiovascular Status Stable  Last Vitals:  Filed Vitals:   05/02/11 1225  BP: 168/94  Pulse: 111  Temp: 37 C  Resp: 12    Post vital signs: Reviewed and stable  Level of consciousness: sedated  Complications: No apparent anesthesia complications

## 2011-05-02 NOTE — Anesthesia Procedure Notes (Addendum)
Anesthesia Regional Block:  Femoral nerve block  Pre-Anesthetic Checklist: ,, timeout performed, Correct Patient, Correct Site, Correct Laterality, Correct Procedure, Correct Position, site marked, Risks and benefits discussed,  Surgical consent,  Pre-op evaluation,  At surgeon's request and post-op pain management  Laterality: Right and Lower  Prep: chloraprep       Needles:  Injection technique: Single-shot  Needle Type: Echogenic Needle     Needle Length: 9cm  Needle Gauge: 22 and 22 G    Additional Needles:  Procedures: ultrasound guided Femoral nerve block Narrative:  Start time: 05/02/2011 9:20 AM End time: 05/02/2011 9:30 AM Injection made incrementally with aspirations every 5 mL.  Performed by: Personally  Anesthesiologist: Sheldon Silvan, MD  Additional Notes: Marcaine 0.5% with EPI 1:200000  Femoral nerve block Procedure Name: LMA Insertion Date/Time: 05/02/2011 11:16 AM Performed by: Leona Singleton A. Patient Re-evaluated:Patient Re-evaluated prior to inductionOxygen Delivery Method: Circle system utilized Preoxygenation: Pre-oxygenation with 100% oxygen Intubation Type: IV induction LMA: LMA inserted LMA Size: 5.0 Number of attempts: 2 Placement Confirmation: positive ETCO2 and breath sounds checked- equal and bilateral Tube secured with: Tape Dental Injury: Teeth and Oropharynx as per pre-operative assessment

## 2011-05-02 NOTE — Anesthesia Preprocedure Evaluation (Addendum)
Anesthesia Evaluation  Patient identified by MRN, date of birth, ID band Patient awake    Reviewed: Allergy & Precautions, H&P , NPO status , Patient's Chart, lab work & pertinent test results  History of Anesthesia Complications (+) Emergence Delirium  Airway Mallampati: I TM Distance: >3 FB Neck ROM: Full    Dental  (+) Teeth Intact, Dental Advisory Given, Partial Lower and Caps   Pulmonary neg pulmonary ROS, Current Smoker (smokeless tobacco),  clear to auscultation        Cardiovascular Exercise Tolerance: Good neg cardio ROS Regular Normal    Neuro/Psych  Headaches, Negative Psych ROS   GI/Hepatic Neg liver ROS, hiatal hernia,   Endo/Other  Negative Endocrine ROS  Renal/GU negative Renal ROS     Musculoskeletal  (+) Arthritis -, Osteoarthritis,    Abdominal   Peds negative pediatric ROS (+)  Hematology negative hematology ROS (+)   Anesthesia Other Findings   Reproductive/Obstetrics negative OB ROS                          Anesthesia Physical Anesthesia Plan  ASA: II  Anesthesia Plan: General   Post-op Pain Management:    Induction: Intravenous  Airway Management Planned: LMA  Additional Equipment:   Intra-op Plan:   Post-operative Plan: Extubation in OR  Informed Consent: I have reviewed the patients History and Physical, chart, labs and discussed the procedure including the risks, benefits and alternatives for the proposed anesthesia with the patient or authorized representative who has indicated his/her understanding and acceptance.   Dental advisory given  Plan Discussed with: CRNA, Anesthesiologist and Surgeon  Anesthesia Plan Comments:         Anesthesia Quick Evaluation

## 2011-05-02 NOTE — Progress Notes (Signed)
Orthopedic Tech Progress Note Patient Details:  Arthur Klein 12-29-1945 [id number]  CPM Right Knee CPM Right Knee: On Right Knee Flexion (Degrees): 60  Right Knee Extension (Degrees): 0    [name] 05/02/2011, 2:49 PM

## 2011-05-02 NOTE — Brief Op Note (Signed)
05/02/2011  11:57 AM  PATIENT:  Arthur Klein  66 y.o. male  PRE-OPERATIVE DIAGNOSIS:  degenerative joint disease  POST-OPERATIVE DIAGNOSIS:  degenerative joint disease  PROCEDURE:  Procedure(s) (LRB): COMPUTER ASSISTED TOTAL KNEE ARTHROPLASTY (Right)  SURGEON:  Surgeon(s) and Role:    * Harvie Junior, MD - Primary  PHYSICIAN ASSISTANT:   ASSISTANTS: bethune    ANESTHESIA:   general  EBL:  Total I/O In: 1000 [I.V.:1000] Out: 100 [Urine:100]  BLOOD ADMINISTERED:none  DRAINS: (1) Hemovact drain(s) in the r.knee with  Suction Open   LOCAL MEDICATIONS USED:  NONE  SPECIMEN:  No Specimen  DISPOSITION OF SPECIMEN:  N/A  COUNTS:  YES  TOURNIQUET:  * Missing tourniquet times found for documented tourniquets in log:  22557 *  DICTATION: .Other Dictation: Dictation Number [id number]  PLAN OF CARE: Admit to inpatient   PATIENT DISPOSITION:  PACU - hemodynamically stable.   Delay start of Pharmacological VTE agent (>24hrs) due to surgical blood loss or risk of bleeding: no

## 2011-05-02 NOTE — Transfer of Care (Signed)
Immediate Anesthesia Transfer of Care Note  Patient: Arthur Klein  Procedure(s) Performed: Procedure(s) (LRB): COMPUTER ASSISTED TOTAL KNEE ARTHROPLASTY (Right)  Patient Location: PACU  Anesthesia Type: General  Level of Consciousness: awake, alert  and oriented  Airway & Oxygen Therapy: Patient Spontanous Breathing and Patient connected to nasal cannula oxygen  Post-op Assessment: Report given to PACU RN and Post -op Vital signs reviewed and stable  Post vital signs: Reviewed and stable  Complications: No apparent anesthesia complications

## 2011-05-02 NOTE — Progress Notes (Signed)
ANTICOAGULATION CONSULT NOTE - Initial Consult  Pharmacy Consult for coumadin Indication: DVT prophylaxis  Allergies  Allergen Reactions  . Erythrocin Diarrhea  . Nsaids Other (See Comments)    Gi bleeding  . Other Other (See Comments)    Pain meds with a NSAID derivative    Vital Signs: Temp: 98.7 F (37.1 C) (03/01 1445) Temp src: Oral (03/01 0742) BP: 162/94 mmHg (03/01 1445) Pulse Rate: 112  (03/01 1445)  Labs: No results found for this basename: HGB:2,HCT:3,PLT:3,APTT:3,LABPROT:3,INR:3,HEPARINUNFRC:3,CREATININE:3,CKTOTAL:3,CKMB:3,TROPONINI:3 in the last 72 hours CrCl is unknown because there is no height on file for the current visit.  Medical History: Past Medical History  Diagnosis Date  . Gout     hx of  . Hypertension     borderline HTNt  . Headache     occasionally  . Arthritis   . Joint pain   . Joint swelling   . H/O hiatal hernia   . Complication of anesthesia     doesn't wake up well,combative until wife talks with him    Assessment: 66 yo male s/p R TKA to start coumadin (last INR= 0.99 on 04/28/11)  Goal of Therapy:  INR= 2.0   Plan:  -Will give coumadin 7.5mg  x1 today -Daily INR -Begin education process  Benny Lennert 05/02/2011,3:02 PM

## 2011-05-03 LAB — BASIC METABOLIC PANEL
BUN: 13 mg/dL (ref 6–23)
Creatinine, Ser: 0.81 mg/dL (ref 0.50–1.35)
GFR calc Af Amer: >90 mL/min (ref >90)
GFR calc non Af Amer: >90 mL/min (ref >90)
Glucose, Bld: 269 mg/dL — ABNORMAL HIGH (ref 70–99)
Potassium: 4.1 mEq/L (ref 3.5–5.1)

## 2011-05-03 LAB — CBC
HCT: 34 % — ABNORMAL LOW (ref 39.0–52.0)
Hemoglobin: 11.8 g/dL — ABNORMAL LOW (ref 13.0–17.0)
MCHC: 34.7 g/dL (ref 30.0–36.0)
MCV: 86.3 fL (ref 78.0–100.0)
RDW: 13.2 % (ref 11.5–15.5)

## 2011-05-03 MED ORDER — WARFARIN VIDEO
Freq: Once | Status: AC
  Administered 2011-05-04: 12:00:00

## 2011-05-03 MED ORDER — WARFARIN SODIUM 7.5 MG PO TABS
7.5000 mg | ORAL_TABLET | Freq: Once | ORAL | Status: AC
  Administered 2011-05-03: 7.5 mg via ORAL
  Filled 2011-05-03: qty 1

## 2011-05-03 NOTE — Progress Notes (Signed)
PT BID treatment  05/03/11 1403  PT Visit Information  Last PT Received On 05/03/11  Precautions  Precautions Knee  Required Braces or Orthoses Yes  Knee Immobilizer On when out of bed or walking  Restrictions  Weight Bearing Restrictions Yes  RLE Weight Bearing WBAT  Bed Mobility  Supine to Sit 6: Modified independent (Device/Increase time)  Transfers  Sit to Stand 6: Modified independent (Device/Increase time)  Stand to Sit 6: Modified independent (Device/Increase time)  Ambulation/Gait  Ambulation/Gait Assistance 5: Supervision  Ambulation/Gait Assistance Details (indicate cue type and reason) cueing for speed/technique  Ambulation Distance (Feet) 150 Feet  Assistive device Rolling walker  Gait Pattern Step-to pattern  Total Joint Exercises  Ankle Circles/Pumps AROM;Both;10 reps;Supine  Quad Sets AROM;Right;10 reps;Supine  Gluteal Sets AROM;Both;10 reps;Supine  Short Arc Quad AROM;Right;10 reps;Supine  Heel Slides AAROM;Right;10 reps;Supine  Straight Leg Raises AROM;Right;10 reps;Supine  PT - End of Session  Equipment Utilized During Treatment Gait belt  Activity Tolerance Patient tolerated treatment well  Patient left in bed;in CPM;with call bell in reach;with family/visitor present  General  Behavior During Session [facility] for tasks performed  Cognition [facility] for tasks performed  PT - Assessment/Plan  Comments on Treatment Session Patient s/p Right TKR, progressing well.  Feel patient will be ready to discharge tomorrow as planned.  PT Plan Discharge plan remains appropriate;Frequency remains appropriate  PT Frequency 7X/week  Follow Up Recommendations Other (comment)  Equipment Recommended Rolling walker with 5" wheels  Acute Rehab PT Goals  PT Goal: Supine/Side to Sit - Progress Met  PT Goal: Sit to Stand - Progress Met  PT Goal: Stand to Sit - Progress Met  PT Goal: Ambulate - Progress Progressing toward goal   05/03/2011 Olivia Canter, [place] [phone]

## 2011-05-03 NOTE — Progress Notes (Signed)
  ANTICOAGULATION CONSULT NOTE - Follow Up Consult  Pharmacy Consult for Coumadin Indication: VTE prophylaxis  Allergies  Allergen Reactions  . Erythrocin Diarrhea  . Nsaids Other (See Comments)    Gi bleeding  . Other Other (See Comments)    Pain meds with a NSAID derivative    Patient Measurements: Height: 5\' 9"  (175.3 cm) Weight: 218 lb (98.884 kg) IBW/kg (Calculated) : 70.7  Heparin Dosing Weight:98.9 kg  Vital Signs: Temp: 97.9 F (36.6 C) (03/02 0512) BP: 145/81 mmHg (03/02 0512) Pulse Rate: 107  (03/02 0512)  Labs:  Basename 05/03/11 0500  HGB 11.8*  HCT 34.0*  PLT 146*  APTT --  LABPROT 14.4  INR 1.10  HEPARINUNFRC --  CREATININE 0.81  CKTOTAL --  CKMB --  TROPONINI --   Estimated Creatinine Clearance: 108.3 ml/min (by C-G formula based on Cr of 0.81).   Medications:  Prescriptions prior to admission  Medication Sig Dispense Refill  . acetaminophen (TYLENOL) 500 MG tablet Take 1,000 mg by mouth every 6 (six) hours as needed. For pain      . Bacillus Coagulans-Inulin (PROBIOTIC-PREBIOTIC) 1-250 BILLION-MG CAPS Take 1 capsule by mouth daily.      . Cholecalciferol (VITAMIN D) 2000 UNITS CAPS Take 4,000 Units by mouth daily.      Marland Kitchen OVER THE COUNTER MEDICATION Take 1 tablet by mouth daily. Juice Plus        Assessment: Assessment:  66 yo male s/p R TKA to start coumadin (last INR= 0.99 on 04/28/11)  05/03/11 INR 1.1  Goal of Therapy:  INR= 2.0   Plan:  -Will give coumadin 7.5mg  x1 today  -Daily INR  -Begin education process to be dced 3/3 or 3/4  Lucille Passy 05/03/2011,10:25 AM

## 2011-05-03 NOTE — Evaluation (Signed)
Physical Therapy Evaluation Patient Details Name: Arthur Klein MRN: [id number] DOB: [birth year] Today's Date: 05/03/2011  Problem List:  Patient Active Problem List  Diagnoses  . Osteoarthritis of right knee    Past Medical History:  Past Medical History  Diagnosis Date  . Gout     hx of  . Hypertension     borderline HTNt  . Headache     occasionally  . Arthritis   . Joint pain   . Joint swelling   . H/O hiatal hernia   . Complication of anesthesia     doesn't wake up well,combative until wife talks with him   Past Surgical History:  Past Surgical History  Procedure Date  . Rotator cuff repair     right  . Rotator cuff repair     left  . Knee arthroscopy     right  . Shoulder arthroscopy     left  . Melanoma excision     stage 3+ lower left lumbar region with axillary node removal  . 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  Patient motivated to discharge home in 1-2 days.  Patient will benefit from PT to increase independence and prepare for discharge home.   PT Recommendation/Assessment: Patient will need skilled PT in the acute care venue PT Problem List: Decreased strength;Decreased range of motion;Decreased mobility;Decreased knowledge of use of DME PT Therapy Diagnosis : Difficulty walking PT Plan PT Frequency: 7X/week PT Treatment/Interventions: DME instruction;Gait training;Stair training;Functional mobility training;Therapeutic activities;Therapeutic exercise PT Recommendation Follow Up Recommendations: Other (comment) (patient will recieve HHPT from daughter who is PT) Equipment Recommended: Rolling walker with 5" wheels;3 in 1 bedside comode PT Goals  Acute Rehab PT Goals PT Goal Formulation: With patient/family Time For Goal Achievement: 3 days Pt will go Supine/Side to Sit: with modified  independence;with HOB 0 degrees PT Goal: Supine/Side to Sit - Progress: Goal set today Pt will go Sit to Supine/Side: with modified independence;with HOB 0 degrees PT Goal: Sit to Supine/Side - Progress: Goal set today Pt will go Sit to Stand: with modified independence;with upper extremity assist PT Goal: Sit to Stand - Progress: Goal set today Pt will go Stand to Sit: with modified independence PT Goal: Stand to Sit - Progress: Goal set today Pt will Ambulate: >150 feet;with modified independence;with rolling walker PT Goal: Ambulate - Progress: Goal set today Pt will Go Up / Down Stairs: 1-2 stairs;with supervision;with least restrictive assistive device PT Goal: Up/Down Stairs - Progress: Goal set today  PT Evaluation Precautions/Restrictions  Precautions Precautions: Knee Precaution Booklet Issued: No Required Braces or Orthoses: Yes Knee Immobilizer: On when out of bed or walking Restrictions Weight Bearing Restrictions: Yes RLE Weight Bearing: Weight bearing as tolerated Prior Functioning  Home Living Lives With: Spouse Receives Help From:  (daughter is PT and will provide therapy post discharge) Type of Home: House Home Layout: One level Home Access: Stairs to enter Entrance Stairs-Rails: None Entrance Stairs-Number of Steps: 2 Home Adaptive Equipment: None Prior Function Level of Independence: Independent with gait;Independent with transfers Driving: Yes Cognition Cognition Arousal/Alertness: Awake/alert Overall Cognitive Status: Appears within functional limits for tasks assessed Orientation Level: Oriented X4 Sensation/Coordination   Extremity Assessment RLE Assessment RLE Assessment: Exceptions to [facility] RLE AROM (degrees) Overall AROM Right Lower Extremity: Deficits RLE Overall AROM Comments: 55 degrees flexion - limited by bandage RLE  Strength RLE Overall Strength: Deficits RLE Overall Strength Comments: WFL ankle and hip, able to straight leg raise and  long arc quad. LLE Assessment LLE Assessment: Within Functional Limits Mobility (including Balance) Bed Mobility Bed Mobility: Yes Supine to Sit: 6: Modified independent (Device/Increase time) Transfers Transfers: Yes Sit to Stand: 5: Supervision Sit to Stand Details (indicate cue type and reason): cueing for  hand placement Stand to Sit: 5: Supervision Stand to Sit Details: cueing for hand placement Ambulation/Gait Ambulation/Gait: Yes Ambulation/Gait Assistance: 5: Supervision Ambulation/Gait Assistance Details (indicate cue type and reason): cueing to slow down/pace, cueing for technique Ambulation Distance (Feet): 60 Feet Assistive device: Rolling walker Gait Pattern: Step-to pattern Stairs: No  Posture/Postural Control Posture/Postural Control: No significant limitations Exercise  Total Joint Exercises Ankle Circles/Pumps: AROM;Both;10 reps;Supine Quad Sets: AROM;Right;10 reps;Supine Long Arc Quad: AROM;Right;10 reps;Seated Knee Flexion: AAROM;Right;10 reps;Seated End of Session PT - End of Session Equipment Utilized During Treatment: Gait belt (pt reports MD said he may or may not need knee immobilizer) Activity Tolerance: Patient tolerated treatment well Patient left: in chair;with family/visitor present;with call bell in reach Nurse Communication: Mobility status for transfers;Mobility status for ambulation General Behavior During Session: [facility] for tasks performed Cognition: [facility] for tasks performed  Olivia Canter, [place] [phone] 05/03/2011, 11:46 AM

## 2011-05-03 NOTE — Progress Notes (Signed)
Subjective: 1 Day Post-Op Procedure(s) (LRB): COMPUTER ASSISTED TOTAL KNEE ARTHROPLASTY (Right) Patient reports pain as 2 on 0-10 scale.    Objective: Vital signs in last 24 hours: Temp:  [97.1 F (36.2 C)-98.8 F (37.1 C)] 97.9 F (36.6 C) (03/02 0512) Pulse Rate:  [88-130] 107  (03/02 0512) Resp:  [10-18] 18  (03/02 0512) BP: (121-168)/(70-100) 145/81 mmHg (03/02 0512) SpO2:  [92 %-100 %] 99 % (03/02 0512) Weight:  [98.884 kg (218 lb)] 98.884 kg (218 lb) (03/01 1715)  Intake/Output from previous day: 03/01 0701 - 03/02 0700 In: 3860 [P.O.:360; I.V.:3250; IV Piggyback:250] Out: 4025 [Urine:3650; Drains:300; Blood:75] Intake/Output this shift:     Basename 05/03/11 0500  HGB 11.8*    Basename 05/03/11 0500  WBC 14.8*  RBC 3.94*  HCT 34.0*  PLT 146*    Basename 05/03/11 0500  NA 135  K 4.1  CL 101  CO2 22  BUN 13  CREATININE 0.81  GLUCOSE 269*  CALCIUM 9.4    Basename 05/03/11 0500  LABPT --  INR 1.10    Neurologically intact ABD soft Neurovascular intact Sensation intact distally Intact pulses distally Dorsiflexion/Plantar flexion intact No cellulitis present Compartment soft  Assessment/Plan: 1 Day Post-Op Procedure(s) (LRB): COMPUTER ASSISTED TOTAL KNEE ARTHROPLASTY (Right) Advance diet Up with therapy Possible d/c tomorrow vs monday  Arthur Klein 05/03/2011, 8:59 AM

## 2011-05-04 LAB — CBC
HCT: 31.2 % — ABNORMAL LOW (ref 39.0–52.0)
MCH: 30 pg (ref 26.0–34.0)
MCV: 87.4 fL (ref 78.0–100.0)
Platelets: 129 10*3/uL — ABNORMAL LOW (ref 150–400)
RBC: 3.57 MIL/uL — ABNORMAL LOW (ref 4.22–5.81)
RDW: 13.8 % (ref 11.5–15.5)
WBC: 13 10*3/uL — ABNORMAL HIGH (ref 4.0–10.5)

## 2011-05-04 MED ORDER — WARFARIN SODIUM 2.5 MG PO TABS
2.5000 mg | ORAL_TABLET | Freq: Once | ORAL | Status: AC
  Administered 2011-05-04: 2.5 mg via ORAL
  Filled 2011-05-04: qty 1

## 2011-05-04 MED ORDER — WARFARIN SODIUM 5 MG PO TABS
5.0000 mg | ORAL_TABLET | Freq: Once | ORAL | Status: DC
  Filled 2011-05-04: qty 1

## 2011-05-04 MED ORDER — OXYCODONE HCL 20 MG PO TB12
20.0000 mg | ORAL_TABLET | Freq: Two times a day (BID) | ORAL | Status: DC
  Administered 2011-05-04 (×2): 20 mg via ORAL
  Filled 2011-05-04 (×2): qty 1

## 2011-05-04 NOTE — Progress Notes (Signed)
Physical Therapy Treatment Patient Details Name: Arthur Klein MRN: [id number] DOB: 10/19/1945 Today's Date: 05/04/2011  PT Assessment/Plan  PT - Assessment/Plan Comments on Treatment Session: Limited afternoon mobility due to pt still dizzy and not feeling well. Was agreeable to bed exercises and has made 3 trips to bathroom with spouse. Agreeable to ambulating with nursing later today if feeling better. PT Plan: Discharge plan remains appropriate;Frequency remains appropriate PT Frequency: 7X/week Follow Up Recommendations: Other (comment) (pt daughter is a PT and plans to do his home therapy) Equipment Recommended: Rolling walker with 5" wheels PT Goals   Pt progressing with exercises for strengthening today.  PT Treatment Precautions/Restrictions  Precautions Precautions: Knee Precaution Booklet Issued: No Required Braces or Orthoses: Yes Knee Immobilizer: On when out of bed or walking Restrictions Weight Bearing Restrictions: Yes RLE Weight Bearing: Weight bearing as tolerated Mobility (including Balance) Pt declined out of bed this afternoon due to pain and not feeling well. Has been up to bathroom several times with spouse assist. Agreeable to ambulation later today with nursing if he feels better.  Exercise  Total Joint Exercises Ankle Circles/Pumps: AROM;Both;10 reps;Supine Quad Sets: AROM;Right;10 reps;Supine Short Arc Quad: AAROM;Right;10 reps;Supine Heel Slides: AAROM;Right;10 reps;Supine Hip ABduction/ADduction: AAROM;Right;10 reps;Supine Straight Leg Raises: AAROM;Right;10 reps;Supine End of Session PT - End of Session Activity Tolerance: Patient tolerated treatment well;Patient limited by pain;Treatment limited secondary to medical complications (Comment) (pt still with dizziness and just general "not feeling good") Patient left: in bed;with call bell in reach;with family/visitor present General Behavior During Session: [facility] for tasks performed Cognition: [facility] for  tasks performed  [name] 05/04/2011, 3:41 PM  [name], PTA Office- [phone] Weekend Pager- [phone]

## 2011-05-04 NOTE — Progress Notes (Signed)
  ANTICOAGULATION CONSULT NOTE - Follow Up Consult  Pharmacy Consult for Coumadin Indication: VTE prophylaxis  Allergies  Allergen Reactions  . Erythrocin Diarrhea  . Nsaids Other (See Comments)    Gi bleeding  . Other Other (See Comments)    Pain meds with a NSAID derivative    Patient Measurements: Height: 5\' 9"  (175.3 cm) Weight: 218 lb (98.884 kg) IBW/kg (Calculated) : 70.7  Heparin Dosing Weight:98.9 kg  Vital Signs:    Labs:  Basename 05/04/11 0655 05/03/11 0500  HGB 10.7* 11.8*  HCT 31.2* 34.0*  PLT 129* 146*  APTT -- --  LABPROT 23.1* 14.4  INR 2.01* 1.10  HEPARINUNFRC -- --  CREATININE -- 0.81  CKTOTAL -- --  CKMB -- --  TROPONINI -- --   Estimated Creatinine Clearance: 108.3 ml/min (by C-G formula based on Cr of 0.81).   Medications:  Prescriptions prior to admission  Medication Sig Dispense Refill  . acetaminophen (TYLENOL) 500 MG tablet Take 1,000 mg by mouth every 6 (six) hours as needed. For pain      . Bacillus Coagulans-Inulin (PROBIOTIC-PREBIOTIC) 1-250 BILLION-MG CAPS Take 1 capsule by mouth daily.      . Cholecalciferol (VITAMIN D) 2000 UNITS CAPS Take 4,000 Units by mouth daily.      Marland Kitchen OVER THE COUNTER MEDICATION Take 1 tablet by mouth daily. Juice Plus        Assessment: Assessment:  66 yo male s/p R TKA to start coumadin (last INR= 0.99 on 04/28/11)  05/03/11 INR 1.1 05/04/11 INR 2.01  Large increase off 7.5 mg dose  Goal of Therapy:  INR= 2.0   Plan:  -Will give coumadin 5 mg x1 today  -Daily INR  -Begin education process to be dced 3/3 or 3/4  Lucille Passy 05/04/2011,10:37 AM

## 2011-05-04 NOTE — Progress Notes (Addendum)
Subjective: 2 Days Post-Op Procedure(s) (LRB): COMPUTER ASSISTED TOTAL KNEE ARTHROPLASTY (Right) Patient reports pain as moderate. Taking po/voiding ok. Mild dizziness.    Objective: Vital signs in last 24 hours: Temp:  [97.9 F (36.6 C)-98.1 F (36.7 C)] 98.1 F (36.7 C) (03/02 2227) Pulse Rate:  [79-85] 85  (03/02 2227) Resp:  [18] 18  (03/02 2227) BP: (135-146)/(67-75) 146/75 mmHg (03/02 2227) SpO2:  [95 %-97 %] 95 % (03/02 2227)  Intake/Output from previous day: 03/02 0701 - 03/03 0700 In: 1320 [P.O.:1320] Out: -  Intake/Output this shift:     Basename 05/04/11 0655 05/03/11 0500  HGB 10.7* 11.8*    Basename 05/04/11 0655 05/03/11 0500  WBC 13.0* 14.8*  RBC 3.57* 3.94*  HCT 31.2* 34.0*  PLT 129* 146*    Basename 05/03/11 0500  NA 135  K 4.1  CL 101  CO2 22  BUN 13  CREATININE 0.81  GLUCOSE 269*  CALCIUM 9.4    Basename 05/04/11 0655 05/03/11 0500  LABPT -- --  INR 2.01* 1.10   Right knee exam: ABD soft Neurovascular intact Sensation intact distally Intact pulses distally Dorsiflexion/Plantar flexion intact Incision: no drainage  Assessment/Plan: 2 Days Post-Op Procedure(s) (LRB): COMPUTER ASSISTED TOTAL KNEE ARTHROPLASTY (Right) PLAN: Up with therapy Discharge home with home health in am D/c saline lock. Add oxycontin 20 mg BID for pain. Dressing changed drain pulled.  Leiliana Foody G 05/04/2011, 10:25 AM

## 2011-05-04 NOTE — Progress Notes (Signed)
Physical Therapy Treatment Patient Details Name: Arthur Klein MRN: [id number] DOB: [birth year] Today's Date: 05/04/2011  PT Assessment/Plan  PT - Assessment/Plan Comments on Treatment Session: Pt progress with stair education completed. Increased dizziness with out of bed mobility (slightly dizzy with initial out of bed that increased after stairs). Requires frequent cues for deep breathing, not to hold breath with mobility. RN aware of pt's complaints of dizziness. Not safe to  discharge home with current status. Will see again this pm (after lunch) for further therapy and determine safety for home at that time. Pt's spouse is in agreement with this plan.                  PT Plan: Discharge plan remains appropriate;Frequency remains appropriate PT Frequency: 7X/week Follow Up Recommendations: Other (comment) (pt's daughter is a PT and plans to do home therapy.) Equipment Recommended: Rolling walker with 5" wheels PT Goals  Acute Rehab PT Goals PT Goal: Supine/Side to Sit - Progress: Progressing toward goal PT Goal: Sit to Supine/Side - Progress: Progressing toward goal PT Goal: Sit to Stand - Progress: Progressing toward goal PT Goal: Stand to Sit - Progress: Progressing toward goal PT Goal: Ambulate - Progress: Progressing toward goal PT Goal: Up/Down Stairs - Progress: Progressing toward goal  PT Treatment Precautions/Restrictions  Precautions Precautions: Knee Precaution Booklet Issued: No Required Braces or Orthoses: Yes Knee Immobilizer: On when out of bed or walking Restrictions Weight Bearing Restrictions: Yes RLE Weight Bearing: Weight bearing as tolerated Mobility (including Balance) Bed Mobility Supine to Sit: 4: Min assist;HOB flat Supine to Sit Details (indicate cue type and reason): no rails used. Pt needed assistance to elevate trunk into sitting (spouse able to provide the needed asssitance). once seated, pt able to get himself to the edge of the bed. Pt did state that  he has a sturdy bedside table he can use at home to assist himself with sitting up. UE limited to due bilateral shoulder injuries prior to knee surgery.            Transfers Sit to Stand: 5: Supervision;From bed;With upper extremity assist;From elevated surface Sit to Stand Details (indicate cue type and reason): bed elevated to home bed height for transfer. pt with good hand and LE placement with standing. Stand to Sit: 5: Supervision;To chair/3-in-1;With upper extremity assist;With armrests Stand to Sit Details: cues to back all the way to the surface before sitting down and to use arms to control desent with sitting down. Ambulation/Gait Ambulation/Gait Assistance: 5: Supervision;4: Min assist Ambulation/Gait Assistance Details (indicate cue type and reason): cues for sequency, RW placement with gait, hand placement on RW with gait, to place right heel down with stance phase, and for upright posture with gait. Increased assist needed on second gait trial due to pt with increased dizziness. Ambulation Distance (Feet): 75 Feet (76ft x2 trials) Assistive device: Rolling walker Gait Pattern: Step-to pattern;Decreased step length - left;Decreased stance time - right;Trunk flexed;Decreased stride length;Decreased weight shift to right;Ataxic Gait velocity: increased with first gait trial, cues to slow down for safety with gait. slower on second gait trial with dizziness. Stairs: Yes Stairs Assistance: 4: Min assist Stairs Assistance Details (indicate cue type and reason): cues (verbal and visual) for correct sequency and technique with going up stairs backwards with RW and down forward with RW. Spouse present and educated on stair technique as well. Handout with written instuctions and pictures issued. Stair Management Technique: With walker;Backwards;Step to pattern Number of Stairs: 2  Posture/Postural Control Posture/Postural Control: No significant limitations Exercise  Total Joint  Exercises Ankle Circles/Pumps: AROM;Both;10 reps;Supine Quad Sets: Right;AROM;10 reps;Supine Heel Slides: AAROM;Right;10 reps;Supine (AAROM: 10-60 degrees flexion supine in bed) Straight Leg Raises: AAROM;Right;10 reps;Supine End of Session PT - End of Session Equipment Utilized During Treatment: Gait belt;Right knee immobilizer Activity Tolerance: Patient tolerated treatment well;Treatment limited secondary to medical complications (Comment) (increased dizziness after stairs, limited gait after stairs.) Patient left: in chair;with family/visitor present Nurse Communication: Mobility status for transfers;Mobility status for ambulation;Weight bearing status;Other (comment) (RN notified of pt's dizziness and pain.) General Behavior During Session: Agitated (pt slightly agitated, however particpated w/o issues.) Cognition: [facility] for tasks performed  [name] 05/04/2011, 10:40 AM  [name], PTA Office- [phone] Weekend Pager- [phone]

## 2011-05-05 ENCOUNTER — Encounter (HOSPITAL_COMMUNITY): Payer: Self-pay | Admitting: *Deleted

## 2011-05-05 LAB — GLUCOSE, CAPILLARY

## 2011-05-05 LAB — CBC
HCT: 30.7 % — ABNORMAL LOW (ref 39.0–52.0)
MCH: 30.3 pg (ref 26.0–34.0)
MCV: 88.5 fL (ref 78.0–100.0)
RBC: 3.47 MIL/uL — ABNORMAL LOW (ref 4.22–5.81)
WBC: 10.9 10*3/uL — ABNORMAL HIGH (ref 4.0–10.5)

## 2011-05-05 MED ORDER — WARFARIN SODIUM 2.5 MG PO TABS
2.5000 mg | ORAL_TABLET | Freq: Once | ORAL | Status: DC
  Filled 2011-05-05: qty 1

## 2011-05-05 MED ORDER — WARFARIN SODIUM 5 MG PO TABS: ORAL_TABLET | ORAL | Status: DC

## 2011-05-05 MED ORDER — HYDROMORPHONE HCL 2 MG PO TABS
2.0000 mg | ORAL_TABLET | ORAL | Status: DC | PRN
  Administered 2011-05-05: 2 mg via ORAL
  Filled 2011-05-05: qty 1

## 2011-05-05 MED ORDER — METHOCARBAMOL 500 MG PO TABS: 500.0000 mg | ORAL_TABLET | Freq: Four times a day (QID) | ORAL | Status: DC | PRN

## 2011-05-05 MED ORDER — HYDROMORPHONE HCL 2 MG PO TABS: 2.0000 mg | ORAL_TABLET | ORAL | Status: AC | PRN

## 2011-05-05 MED ORDER — PROMETHAZINE HCL 12.5 MG PO TABS: 25.0000 mg | ORAL_TABLET | Freq: Four times a day (QID) | ORAL | Status: DC | PRN

## 2011-05-05 NOTE — Progress Notes (Signed)
CARE MANAGEMENT NOTE 05/05/2011  Action/Plan:   Discharge planning. Spoke with patient and wife. Choice offered. Entered in TLC   Anticipated DC Date:  05/05/2011   Anticipated DC Plan:  HOME W HOME HEALTH SERVICES  In-house referral  NA      DC Planning Services  CM consult      [facility] Choice  HOME HEALTH  DURABLE MEDICAL EQUIPMENT   Choice offered to / List presented to:  C-3 Spouse   DME arranged  3-N-1  WALKER - ROLLING  CPM      DME agency  [employer]     HH arranged  HH-1 RN  HH-2 PT      HH agency  [facility]   Status of service:  Completed, signed off Medicare Important Message given?   (If response is "NO", the following Medicare IM given date fields will be blank) Date Medicare IM given:   Date Additional Medicare IM given:    Discharge Disposition:  HOME W HOME HEALTH SERVICES  Per UR Regulation:    Comments:

## 2011-05-05 NOTE — Progress Notes (Signed)
  ANTICOAGULATION CONSULT NOTE - Follow Up Consult  Pharmacy Consult for Coumadin Indication: VTE prophylaxis  Allergies  Allergen Reactions  . Erythrocin Diarrhea  . Nsaids Other (See Comments)    Gi bleeding  . Other Other (See Comments)    Pain meds with a NSAID derivative    Patient Measurements: Height: 5\' 9"  (175.3 cm) Weight: 218 lb (98.884 kg) IBW/kg (Calculated) : 70.7  Heparin Dosing Weight:98.9 kg  Vital Signs: Temp: 99 F (37.2 C) (03/04 0633) BP: 136/76 mmHg (03/04 0633) Pulse Rate: 105  (03/04 0633)  Labs:  Basename 05/05/11 0545 05/04/11 0655 05/03/11 0500  HGB 10.5* 10.7* --  HCT 30.7* 31.2* 34.0*  PLT 138* 129* 146*  APTT -- -- --  LABPROT 26.3* 23.1* 14.4  INR 2.37* 2.01* 1.10  HEPARINUNFRC -- -- --  CREATININE -- -- 0.81  CKTOTAL -- -- --  CKMB -- -- --  TROPONINI -- -- --   Estimated Creatinine Clearance: 108.3 ml/min (by C-G formula based on Cr of 0.81).   Medications:  Prescriptions prior to admission  Medication Sig Dispense Refill  . Bacillus Coagulans-Inulin (PROBIOTIC-PREBIOTIC) 1-250 BILLION-MG CAPS Take 1 capsule by mouth daily.      . Cholecalciferol (VITAMIN D) 2000 UNITS CAPS Take 4,000 Units by mouth daily.      Marland Kitchen OVER THE COUNTER MEDICATION Take 1 tablet by mouth daily. Juice Plus      . DISCONTD: acetaminophen (TYLENOL) 500 MG tablet Take 1,000 mg by mouth every 6 (six) hours as needed. For pain        Assessment:  66 yo male s/p R TKA to start coumadin (last INR= 0.99 on 04/28/11)  05/03/11 INR 1.1 05/04/11 INR 2.01  Large increase off 7.5 mg dose 05/05/11: INR 2.37. Patient reporting pain and nausea.  Goal of Therapy:  INR= 2.0   Plan:  -Will give coumadin 2.5 mg x1 today, then 5mg  daily. -Daily INR    Merilynn Finland, [employer] 05/05/2011,8:51 AM

## 2011-05-05 NOTE — Progress Notes (Signed)
Subjective: 3 Days Post-Op Procedure(s) (LRB): COMPUTER ASSISTED TOTAL KNEE ARTHROPLASTY (Right) Patient reports pain as 5 on 0-10 scale.   C/o nausea. Voiding ok  Objective: Vital signs in last 24 hours: Temp:  [99 F (37.2 C)-99.2 F (37.3 C)] 99 F (37.2 C) (03/04 1610) Pulse Rate:  [100-105] 105  (03/04 0633) Resp:  [16-18] 16  (03/04 0633) BP: (136-146)/(76-85) 136/76 mmHg (03/04 0633) SpO2:  [94 %-96 %] 94 % (03/04 0633)  Intake/Output from previous day: 03/03 0701 - 03/04 0700 In: 1200 [P.O.:1200] Out: -  Intake/Output this shift:     Basename 05/05/11 0545 05/04/11 0655 05/03/11 0500  HGB 10.5* 10.7* 11.8*    Basename 05/05/11 0545 05/04/11 0655  WBC 10.9* 13.0*  RBC 3.47* 3.57*  HCT 30.7* 31.2*  PLT 138* 129*    Basename 05/03/11 0500  NA 135  K 4.1  CL 101  CO2 22  BUN 13  CREATININE 0.81  GLUCOSE 269*  CALCIUM 9.4    Basename 05/05/11 0545 05/04/11 0655  LABPT -- --  INR 2.37* 2.01*   Right knee: Neurovascular intact Sensation intact distally Intact pulses distally Dorsiflexion/Plantar flexion intact Incision: no drainage Compartment soft  Assessment/Plan: 3 Days Post-Op Procedure(s) (LRB): COMPUTER ASSISTED TOTAL KNEE ARTHROPLASTY (Right) Plan: Discharge home with home health D/c oxycontin Try dilaudid for pain  Kamee Bobst G 05/05/2011, 8:09 AM

## 2011-05-05 NOTE — Progress Notes (Signed)
Physical Therapy Treatment Note   05/05/11 0945  PT Visit Information  Last PT Received On 05/05/11  Precautions  Precautions Knee  Restrictions  RLE Weight Bearing WBAT  Bed Mobility  Bed Mobility Yes  Sit to Supine 4: Min assist;HOB flat  Sit to Supine - Details (indicate cue type and reason) assist for R LE managment  Transfers  Sit to Stand 4: Min assist;With upper extremity assist;From chair/3-in-1;From bed  Sit to Stand Details (indicate cue type and reason) verbal cues for hand placement and R LE management  Stand to Sit 4: Min assist;To bed;To chair/3-in-1  Stand to Sit Details verbal cues for safe walker management and assist for R LE management  Ambulation/Gait  Ambulation/Gait Yes  Ambulation/Gait Assistance 4: Min assist (contact guard)  Ambulation/Gait Assistance Details (indicate cue type and reason) increased R knee flexion, dec R LE weightbearing  Ambulation Distance (Feet) 50 Feet (x2 (to/from gym))  Assistive device Rolling walker  Gait Pattern Step-to pattern;Decreased step length - right;Decreased stance time - right  Gait velocity decreased, pt c/o "I feel so terrible."  Stairs No (pt deferred due to increased nausea, dizziness, and reported generalized weakness)  Total Joint Exercises  Ankle Circles/Pumps AROM;Both;10 reps;Seated  Quad Sets AROM;Right;10 reps;Supine  Short Arc Quad AAROM;Right;10 reps;Seated  Heel Slides AAROM;Right;15 reps;Seated (achieved approx 60 degrees of AA R knee flexion prior to onset of pain)  PT - End of Session  Equipment Utilized During Treatment Gait belt  Activity Tolerance Patient limited by fatigue;Patient limited by pain;Treatment limited secondary to agitation  Patient left in bed;with call bell in reach;with family/visitor present  Nurse Communication Mobility status for ambulation  General  Behavior During Session [facility] for tasks performed  Cognition [facility] for tasks performed  PT - Assessment/Plan  Comments on  Treatment Session Patient very irritated with the way the medications are making him feel as well as the increased pain in R knee that he wasn't anticipating. Patient reports pain to not be under control. Patient remains safe to return home with 24/7 supervision and [facility] PT. Will trial steps again this afternoon.  PT Plan Discharge plan remains appropriate;Frequency remains appropriate  PT Frequency 7X/week  Follow Up Recommendations Home health PT  Equipment Recommended Rolling walker with 5" wheels  Acute Rehab PT Goals  PT Goal: Supine/Side to Sit - Progress Progressing toward goal  PT Goal: Sit to Supine/Side - Progress Progressing toward goal  PT Goal: Sit to Stand - Progress Progressing toward goal  PT Goal: Stand to Sit - Progress Progressing toward goal  PT Goal: Ambulate - Progress Progressing toward goal  PT Goal: Up/Down Stairs - Progress Progressing toward goal    Therapeutic activity: provided therapeutic massagex 10 min  to R IT band due significant tightness and to assist with edema management. Pt with increased tenderness at distal thigh near the lateral knee.   Pain: 10/10 pain with mobility/ROM  Lewis Shock, PT, DPT Pager #: [phone] Office #: [phone]

## 2011-05-05 NOTE — Progress Notes (Signed)
Physical Therapy Treatment Note   05/05/11 1400  PT Visit Information  Last PT Received On 05/05/11  Precautions  Precautions Knee  Restrictions  RLE Weight Bearing WBAT  Transfers  Sit to Stand 4: Min assist  Sit to Stand Details (indicate cue type and reason) contact guard  Stand to Sit 4: Min assist  Stand to Sit Details contact guard and assist for L LE managment  Ambulation/Gait  Ambulation/Gait Assistance 4: Min assist  Ambulation/Gait Assistance Details (indicate cue type and reason) contact guard x1, decreased L knee flexion with swing through, increased L LE WBing compared to AM session  Ambulation Distance (Feet) 50 Feet (x2, to/from gym, c/o dizziness)  Assistive device Rolling walker  Gait Pattern Step-through pattern;Decreased step length - right;Decreased stance time - right  Gait velocity decreased  Stairs Yes  Stairs Assistance 4: Min assist  Stairs Assistance Details (indicate cue type and reason) verbal cues for sequencing, assist for walker management  Stair Management Technique Backwards;With walker  Number of Stairs 2   Wheelchair Mobility  Wheelchair Mobility No  Posture/Postural Control  Posture/Postural Control No significant limitations  Balance  Balance Assessed No  PT - End of Session  Equipment Utilized During Treatment Gait belt  Activity Tolerance Patient tolerated treatment well  Patient left in chair;with family/visitor present;with call bell in reach (with LEs elevated)  Nurse Communication Mobility status for ambulation  General  Behavior During Session Agitated  Cognition WFL for tasks performed  PT - Assessment/Plan  Comments on Treatment Session patient safe to return home with spouse. Patient remains agitated due to increased pain and fatigue.  PT Plan Discharge plan remains appropriate;Frequency remains appropriate  PT Frequency 7X/week  Follow Up Recommendations Home health PT  Acute Rehab PT Goals  PT Goal: Supine/Side to Sit -  Progress Progressing toward goal  PT Goal: Sit to Supine/Side - Progress Progressing toward goal  PT Goal: Sit to Stand - Progress Progressing toward goal  PT Goal: Stand to Sit - Progress Progressing toward goal  PT Goal: Ambulate - Progress Progressing toward goal  PT Goal: Up/Down Stairs - Progress Progressing toward goal    Pain: 6/10 R knee    Lewis Shock, PT, DPT Pager #: [phone] Office #: [phone]

## 2011-05-05 NOTE — Progress Notes (Signed)
Pt D/Cd to home. Scripts and D/C papers given.

## 2011-05-06 ENCOUNTER — Encounter (HOSPITAL_COMMUNITY): Payer: Self-pay | Admitting: Orthopedic Surgery

## 2011-05-07 NOTE — Discharge Summary (Signed)
Patient ID: Arthur Klein MRN: [id number] DOB/AGE: May 05, 1945 66 y.o.  Admit date: 05/02/2011 Discharge date:05/05/11 Admission Diagnoses:  Principal Problem:  *Osteoarthritis of right knee   Discharge Diagnoses:  Same  Past Medical History  Diagnosis Date  . Gout     hx of  . Hypertension     borderline HTNt  . Headache     occasionally  . Arthritis   . Joint pain   . Joint swelling   . H/O hiatal hernia   . Complication of anesthesia     doesn't wake up well,combative until wife talks with him    Surgeries: Procedure(s):Right COMPUTER ASSISTED TOTAL KNEE ARTHROPLASTY on 05/02/2011  Discharged Condition: Improved  Hospital Course: Hilman Kissling is an 66 y.o. male who was admitted 05/02/2011 for operative treatment ofOsteoarthritis of right knee. Patient has severe unremitting pain that affects sleep, daily activities, and work/hobbies. After pre-op clearance the patient was taken to the operating room on 05/02/2011 and underwent  Procedure(s): COMPUTER ASSISTED TOTAL KNEE ARTHROPLASTY.    Patient was given perioperative antibiotics: Anti-infectives     Start     Dose/Rate Route Frequency Ordered Stop   05/02/11 1600   ceFAZolin (ANCEF) IVPB 2 g/50 mL premix        2 g 100 mL/hr over 30 Minutes Intravenous Every 6 hours 05/02/11 1425 05/03/11 0453   05/02/11 1042   cefUROXime (ZINACEF) injection  Status:  Discontinued          As needed 05/02/11 1043 05/02/11 1221   05/01/11 1345   ceFAZolin (ANCEF) IVPB 2 g/50 mL premix        2 g 100 mL/hr over 30 Minutes Intravenous 60 min pre-op 05/01/11 1343 05/02/11 1009           Patient was given sequential compression devices, early ambulation, and chemoprophylaxis to prevent DVT.  Patient benefited maximally from hospital stay and there were no complications.     Recent laboratory studies:  Basename 05/05/11 0545  WBC 10.9*  HGB 10.5*  HCT 30.7*  PLT 138*  NA --  K --  CL --  CO2 --  BUN --  CREATININE --    GLUCOSE --  INR 2.37*  CALCIUM --     Discharge Medications:   Medication List  As of 05/07/2011  5:13 PM   STOP taking these medications         TYLENOL 500 MG tablet         TAKE these medications         HYDROmorphone 2 MG tablet   Commonly known as: DILAUDID   Take 1 tablet (2 mg total) by mouth every 4 (four) hours as needed for pain.      methocarbamol 500 MG tablet   Commonly known as: ROBAXIN   Take 1 tablet (500 mg total) by mouth every 6 (six) hours as needed.      OVER THE COUNTER MEDICATION   Take 1 tablet by mouth daily. Juice Plus      PROBIOTIC-PREBIOTIC 1-250 BILLION-MG Caps   Take 1 capsule by mouth daily.      promethazine 12.5 MG tablet   Commonly known as: PHENERGAN   Take 2 tablets (25 mg total) by mouth every 6 (six) hours as needed for nausea.      Vitamin D 2000 UNITS Caps   Take 4,000 Units by mouth daily.      warfarin 5 MG tablet   Commonly known as: COUMADIN   One  tablet daily unless otherwise directed x 1 month.            Diagnostic Studies: Dg Chest 2 View  04/28/2011  *RADIOLOGY REPORT*  Clinical Data: Preop for total knee arthroplasty.  CHEST - 2 VIEW  Comparison: None  Findings: The cardiac silhouette, mediastinal and hilar contours are within normal limits.  The lungs are clear.  No pleural effusion.  The bony thorax is intact.  Remote surgical changes involving the left shoulder.  IMPRESSION: Normal chest x-ray.  Original Report Authenticated By: P. Loralie Champagne, M.D.    Disposition: Home-Health Care Svc  Discharge Orders    Future Orders Please Complete By Expires   Diet general      Call MD / Call 911      Comments:   If you experience chest pain or shortness of breath, CALL 911 and be transported to the hospital emergency room.  If you develope a fever above 101 F, pus (white drainage) or increased drainage or redness at the wound, or calf pain, call your surgeon's office.   Weight Bearing as taught in Physical Therapy       Comments:   Use a walker or crutches as instructed.   CPM      Comments:   Continuous passive motion machine (CPM):      Use the CPM from 0 to 60 degrees for 8 hours per day.      You may increase by 10 degrees per day.  You may break it up into 2 or 3 sessions per day.      Use CPM for 1-2 weeks or until you are told to stop.   Do not put a pillow under the knee. Place it under the heel.         Follow-up Information    Follow up with GRAVES,JOHN L, MD. Schedule an appointment as soon as possible for a visit in 10 days.   Contact information:   [address] [place] [address] [phone]           Signed: Matthew Folks 05/07/2011, 5:13 PM

## 2011-05-08 ENCOUNTER — Emergency Department (HOSPITAL_COMMUNITY)
Admission: EM | Admit: 2011-05-08 | Discharge: 2011-05-08 | Disposition: A | Discharge status: Home / Self Care | Payer: 59 | Attending: Emergency Medicine | Admitting: Emergency Medicine

## 2011-05-08 ENCOUNTER — Emergency Department (HOSPITAL_COMMUNITY): Payer: 59

## 2011-05-08 ENCOUNTER — Encounter (HOSPITAL_COMMUNITY): Payer: Self-pay | Admitting: Emergency Medicine

## 2011-05-08 ENCOUNTER — Other Ambulatory Visit: Payer: Self-pay

## 2011-05-08 DIAGNOSIS — Z862 Personal history of diseases of the blood and blood-forming organs and certain disorders involving the immune mechanism: Secondary | ICD-10-CM | POA: Insufficient documentation

## 2011-05-08 DIAGNOSIS — M79606 Pain in leg, unspecified: Secondary | ICD-10-CM

## 2011-05-08 DIAGNOSIS — I10 Essential (primary) hypertension: Secondary | ICD-10-CM | POA: Insufficient documentation

## 2011-05-08 DIAGNOSIS — R000 Tachycardia, unspecified: Secondary | ICD-10-CM

## 2011-05-08 DIAGNOSIS — Z7901 Long term (current) use of anticoagulants: Secondary | ICD-10-CM | POA: Insufficient documentation

## 2011-05-08 DIAGNOSIS — R0989 Other specified symptoms and signs involving the circulatory and respiratory systems: Secondary | ICD-10-CM | POA: Insufficient documentation

## 2011-05-08 DIAGNOSIS — Z79899 Other long term (current) drug therapy: Secondary | ICD-10-CM | POA: Insufficient documentation

## 2011-05-08 DIAGNOSIS — G8918 Other acute postprocedural pain: Secondary | ICD-10-CM

## 2011-05-08 DIAGNOSIS — R0602 Shortness of breath: Secondary | ICD-10-CM

## 2011-05-08 DIAGNOSIS — M129 Arthropathy, unspecified: Secondary | ICD-10-CM | POA: Insufficient documentation

## 2011-05-08 DIAGNOSIS — R509 Fever, unspecified: Secondary | ICD-10-CM | POA: Insufficient documentation

## 2011-05-08 DIAGNOSIS — Z8639 Personal history of other endocrine, nutritional and metabolic disease: Secondary | ICD-10-CM | POA: Insufficient documentation

## 2011-05-08 DIAGNOSIS — R011 Cardiac murmur, unspecified: Secondary | ICD-10-CM | POA: Insufficient documentation

## 2011-05-08 DIAGNOSIS — M79609 Pain in unspecified limb: Secondary | ICD-10-CM | POA: Insufficient documentation

## 2011-05-08 DIAGNOSIS — R42 Dizziness and giddiness: Secondary | ICD-10-CM | POA: Insufficient documentation

## 2011-05-08 DIAGNOSIS — R0609 Other forms of dyspnea: Secondary | ICD-10-CM | POA: Insufficient documentation

## 2011-05-08 DIAGNOSIS — R0682 Tachypnea, not elsewhere classified: Secondary | ICD-10-CM | POA: Insufficient documentation

## 2011-05-08 DIAGNOSIS — R0600 Dyspnea, unspecified: Secondary | ICD-10-CM

## 2011-05-08 LAB — URINALYSIS, ROUTINE W REFLEX MICROSCOPIC
Bilirubin Urine: NEGATIVE
Hgb urine dipstick: NEGATIVE
Ketones, ur: NEGATIVE mg/dL
Nitrite: NEGATIVE
Specific Gravity, Urine: 1.017 (ref 1.005–1.030)
Urobilinogen, UA: 1 mg/dL (ref 0.0–1.0)

## 2011-05-08 LAB — COMPREHENSIVE METABOLIC PANEL
ALT: 27 U/L (ref 0–53)
AST: 25 U/L (ref 0–37)
Alkaline Phosphatase: 74 U/L (ref 39–117)
CO2: 26 mEq/L (ref 19–32)
Calcium: 9.1 mg/dL (ref 8.4–10.5)
Chloride: 97 mEq/L (ref 96–112)
GFR calc Af Amer: >90 mL/min (ref >90)
GFR calc non Af Amer: 89 mL/min — ABNORMAL LOW (ref >90)
Glucose, Bld: 121 mg/dL — ABNORMAL HIGH (ref 70–99)
Potassium: 3.8 mEq/L (ref 3.5–5.1)
Sodium: 134 mEq/L — ABNORMAL LOW (ref 135–145)
Total Bilirubin: 0.7 mg/dL (ref 0.3–1.2)

## 2011-05-08 LAB — DIFFERENTIAL
Basophils Absolute: 0 10*3/uL (ref 0.0–0.1)
Basophils Relative: 0 % (ref 0–1)
Eosinophils Absolute: 0.2 10*3/uL (ref 0.0–0.7)
Eosinophils Relative: 2 % (ref 0–5)
Monocytes Absolute: 1.4 10*3/uL — ABNORMAL HIGH (ref 0.1–1.0)
Monocytes Relative: 11 % (ref 3–12)

## 2011-05-08 LAB — CBC
Hemoglobin: 10.5 g/dL — ABNORMAL LOW (ref 13.0–17.0)
MCH: 29.6 pg (ref 26.0–34.0)
Platelets: 272 10*3/uL (ref 150–400)
RBC: 3.55 MIL/uL — ABNORMAL LOW (ref 4.22–5.81)
WBC: 11.3 10*3/uL — ABNORMAL HIGH (ref 4.0–10.5)

## 2011-05-08 LAB — PROTIME-INR: INR: 1.91 — ABNORMAL HIGH (ref 0.00–1.49)

## 2011-05-08 MED ORDER — HYDROMORPHONE HCL PF 1 MG/ML IJ SOLN
1.0000 mg | Freq: Once | INTRAMUSCULAR | Status: AC
  Administered 2011-05-08: 1 mg via INTRAVENOUS
  Filled 2011-05-08: qty 1

## 2011-05-08 MED ORDER — IOHEXOL 350 MG/ML SOLN
180.0000 mL | Freq: Once | INTRAVENOUS | Status: AC | PRN
Start: 2011-05-08 — End: 2011-05-08
  Administered 2011-05-08: 180 mL via INTRAVENOUS

## 2011-05-08 MED ORDER — SODIUM CHLORIDE 0.9 % IV BOLUS (SEPSIS)
1000.0000 mL | Freq: Once | INTRAVENOUS | Status: AC
  Administered 2011-05-08: 1000 mL via INTRAVENOUS

## 2011-05-08 NOTE — ED Provider Notes (Signed)
History     CSN: [id number]  Arrival date & time 05/08/11  1507   First MD Initiated Contact with Patient 05/08/11 1544      Chief Complaint  Patient presents with  . Dizziness    (Consider location/radiation/quality/duration/timing/severity/associated sxs/prior treatment) Patient is a 66 y.o. male presenting with shortness of breath. The history is provided by the patient and a relative.  Shortness of Breath  The current episode started 3 to 5 days ago. The problem occurs continuously. The problem has been gradually worsening. The problem is moderate. The symptoms are relieved by nothing. The symptoms are aggravated by activity. Associated symptoms include a fever (recently up to 101) and shortness of breath. Pertinent negatives include no chest pain, no chest pressure, no rhinorrhea and no cough. Recently, medical care has been given by a specialist and at this facility (recent right TKA, dc'd home on 3/4).    Past Medical History  Diagnosis Date  . Gout     hx of  . Hypertension     borderline HTNt  . Headache     occasionally  . Arthritis   . Joint pain   . Joint swelling   . H/O hiatal hernia   . Complication of anesthesia     doesn't wake up well,combative until wife talks with him    Past Surgical History  Procedure Date  . Rotator cuff repair     right  . Rotator cuff repair     left  . Knee arthroscopy     right  . Shoulder arthroscopy     left  . Melanoma excision     stage 3+ lower left lumbar region with axillary node removal  . 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 Knee arthroplasty 05/02/2011    Procedure: COMPUTER ASSISTED TOTAL KNEE ARTHROPLASTY;  Surgeon: Harvie Junior, MD;  Location: [facility] OR;  Service: Orthopedics;  Laterality: Right;    Family History  Problem Relation Age of Onset  . Anesthesia problems Neg Hx   . Hypotension Neg Hx   . Malignant hyperthermia Neg Hx   . Pseudochol deficiency Neg Hx     History    Substance Use Topics  . Smoking status: Never Smoker   . Smokeless tobacco: Current User    Types: Chew   Comment: Patient declines materials to quit chewing tobacco.  . Alcohol Use: No      Review of Systems  Constitutional: Positive for fever (recently up to 101).  HENT: Negative for rhinorrhea.   Respiratory: Positive for shortness of breath. Negative for cough.   Cardiovascular: Negative for chest pain.  All other systems reviewed and are negative.    Allergies  Erythrocin; Nsaids; and Other  Home Medications   Current Outpatient Rx  Name Route Sig Dispense Refill  . ACETAMINOPHEN 500 MG PO TABS Oral Take 500 mg by mouth every 6 (six) hours as needed.    Marland Kitchen PROBIOTIC-PREBIOTIC 1-250 BILLION-MG PO CAPS Oral Take 1 capsule by mouth daily.    Marland Kitchen VITAMIN D 2000 UNITS PO CAPS Oral Take 4,000 Units by mouth daily.    . CYCLOBENZAPRINE HCL 10 MG PO TABS Oral Take 10 mg by mouth 3 (three) times daily as needed.    Marland Kitchen HYDROMORPHONE HCL 2 MG PO TABS Oral Take 1 tablet (2 mg total) by mouth every 4 (four) hours as needed for pain. 50 tablet 0  . POLYETHYLENE GLYCOL 3350 PO PACK  Oral Take 17 g by mouth daily.    Marland Kitchen PROMETHAZINE HCL 12.5 MG PO TABS Oral Take 2 tablets (25 mg total) by mouth every 6 (six) hours as needed for nausea. 30 tablet 0  . WARFARIN SODIUM 5 MG PO TABS Oral Take 2.5-5 mg by mouth daily. Take 1/2 a tablet for 2 days then 1 tablet for 1 day. Alternating back and forth.      BP 143/76  Pulse 113  Temp(Src) 99.3 F (37.4 C) (Oral)  Resp 22  SpO2 95%  Physical Exam  Constitutional: He is oriented to person, place, and time. He appears well-developed and well-nourished. No distress.  HENT:  Head: Normocephalic and atraumatic.  Right Ear: External ear normal.  Left Ear: External ear normal.  Mouth/Throat: Oropharynx is clear and moist.  Eyes: Pupils are equal, round, and reactive to light.  Neck: Normal range of motion. Neck supple.  Cardiovascular: Regular  rhythm and intact distal pulses.  Exam reveals no gallop and no friction rub.   Murmur (II/VI SEM heard best over right sternal border) heard.      Tachycardic.   Pulmonary/Chest: Breath sounds normal. No respiratory distress. He has no wheezes. He has no rales.       Tachypneic. Pt able to speak in full sentences but with some difficulty 2/2 SOB   Abdominal: Soft. There is no tenderness. There is no rebound and no guarding.  Musculoskeletal: Normal range of motion. He exhibits no edema.       RLE appropriately bandaged from recent surgery. No asymmetric swelling  Lymphadenopathy:    He has no cervical adenopathy.  Neurological: He is alert and oriented to person, place, and time.  Skin: Skin is warm and dry. No rash noted. No erythema.  Psychiatric: He has a normal mood and affect. His behavior is normal.    ED Course  Procedures (including critical care time)  Results for orders placed during the hospital encounter of 05/08/11  CBC      Component Value Range   WBC 11.3 (*) 4.0 - 10.5 (K/uL)   RBC 3.55 (*) 4.22 - 5.81 (MIL/uL)   Hemoglobin 10.5 (*) 13.0 - 17.0 (g/dL)   HCT 16.1 (*) 09.6 - 52.0 (%)   MCV 89.0  78.0 - 100.0 (fL)   MCH 29.6  26.0 - 34.0 (pg)   MCHC 33.2  30.0 - 36.0 (g/dL)   RDW 04.5  40.9 - 81.1 (%)   Platelets 272  150 - 400 (K/uL)  COMPREHENSIVE METABOLIC PANEL      Component Value Range   Sodium 134 (*) 135 - 145 (mEq/L)   Potassium 3.8  3.5 - 5.1 (mEq/L)   Chloride 97  96 - 112 (mEq/L)   CO2 26  19 - 32 (mEq/L)   Glucose, Bld 121 (*) 70 - 99 (mg/dL)   BUN 16  6 - 23 (mg/dL)   Creatinine, Ser 9.14  0.50 - 1.35 (mg/dL)   Calcium 9.1  8.4 - 78.2 (mg/dL)   Total Protein 7.2  6.0 - 8.3 (g/dL)   Albumin 3.2 (*) 3.5 - 5.2 (g/dL)   AST 25  0 - 37 (U/L)   ALT 27  0 - 53 (U/L)   Alkaline Phosphatase 74  39 - 117 (U/L)   Total Bilirubin 0.7  0.3 - 1.2 (mg/dL)   GFR calc non Af Amer 89 (*) >90 (mL/min)   GFR calc Af Amer >90  >90 (mL/min)  D-DIMER,  QUANTITATIVE  Component Value Range   D-Dimer, Quant 2.13 (*) 0.00 - 0.48 (ug/mL-FEU)  DIFFERENTIAL      Component Value Range   Neutrophils Relative 65  43 - 77 (%)   Neutro Abs 8.3 (*) 1.7 - 7.7 (K/uL)   Lymphocytes Relative 22  12 - 46 (%)   Lymphs Abs 2.8  0.7 - 4.0 (K/uL)   Monocytes Relative 11  3 - 12 (%)   Monocytes Absolute 1.4 (*) 0.1 - 1.0 (K/uL)   Eosinophils Relative 2  0 - 5 (%)   Eosinophils Absolute 0.2  0.0 - 0.7 (K/uL)   Basophils Relative 0  0 - 1 (%)   Basophils Absolute 0.0  0.0 - 0.1 (K/uL)  URINALYSIS, ROUTINE W REFLEX MICROSCOPIC      Component Value Range   Color, Urine YELLOW  YELLOW    APPearance CLEAR  CLEAR    Specific Gravity, Urine 1.017  1.005 - 1.030    pH 6.0  5.0 - 8.0    Glucose, UA 100 (*) NEGATIVE (mg/dL)   Hgb urine dipstick NEGATIVE  NEGATIVE    Bilirubin Urine NEGATIVE  NEGATIVE    Ketones, ur NEGATIVE  NEGATIVE (mg/dL)   Protein, ur NEGATIVE  NEGATIVE (mg/dL)   Urobilinogen, UA 1.0  0.0 - 1.0 (mg/dL)   Nitrite NEGATIVE  NEGATIVE    Leukocytes, UA NEGATIVE  NEGATIVE   PROTIME-INR      Component Value Range   Prothrombin Time 22.2 (*) 11.6 - 15.2 (seconds)   INR 1.91 (*) 0.00 - 1.49   TROPONIN I      Component Value Range   Troponin I <0.30  <0.30 (ng/mL)    Dg Chest 2 View  05/08/2011  *RADIOLOGY REPORT*  Clinical Data: Dizziness and chest pain.  Dyspnea  CHEST - 2 VIEW  Comparison: 04/28/2011  Findings: The heart size and mediastinal contours are within normal limits.  Both lungs are clear.  The visualized skeletal structures are unremarkable.  IMPRESSION: No active cardiopulmonary abnormalities.  Original Report Authenticated By: Rosealee Albee, M.D.   Ct Angio Chest W/cm &/or Wo Cm  05/08/2011  *RADIOLOGY REPORT*  Clinical Data: Dyspnea.  Recent right knee surgery.  CT ANGIOGRAPHY CHEST  Technique:  Multidetector CT imaging of the chest using the standard protocol during bolus administration of intravenous contrast. Multiplanar  reconstructed images including MIPs were obtained and reviewed to evaluate the vascular anatomy.  Contrast: OMNIPAQUE IOHEXOL 350 MG/ML IV SOLN  Comparison: 05/08/2011  Findings:  No enlarged axillary or supraclavicular adenopathy.  There is no enlarged mediastinal or hilar adenopathy.  No pericardial or pleural effusion.  Calcification within the RCA and LAD coronary arteries noted.  The pulmonary arteries are patent.  No abnormal filling defects within the main pulmonary are or its branches to suggest an acute pulmonary embolus.  Within the left upper lobe there is a pulmonary nodule which measures 4.6 mm, image 12.  There is no airspace consolidation.  Tracheobronchial tree appears patent.  Review of the visualized osseous structures is significant for a chronic-appearing deformity involving the anterior aspect of the left third rib.  Limited imaging through the upper abdomen shows fatty infiltration of the liver.  IMPRESSION:  1.  No acute cardiopulmonary abnormalities.  No evidence for pulmonary embolus. 2.  Left upper lobe pulmonary nodule measuring 4.6 mm. If the patient is at high risk for bronchogenic carcinoma, follow-up chest CT at 6-12 months is recommended.  If the patient is at low risk  for bronchogenic carcinoma, follow-up chest CT at 12 months is recommended.  This recommendation follows the consensus statement: Guidelines for Management of Small Pulmonary Nodules Detected on CT Scans: A Statement from the Fleischner Society as published in Radiology 2005; 237:395-400.  Original Report Authenticated By: Rosealee Albee, M.D.     Date: 05/08/2011  Rate: 110  Rhythm: sinus tachycardia  QRS Axis: normal  Intervals: normal  ST/T Wave abnormalities: normal  Conduction Disutrbances:none  Narrative Interpretation:   Old EKG Reviewed: NSR, normal EKG    1. Tachycardia   2. Dyspnea   3. Leg pain   4. Post-operative pain       MDM  70:9 PM 66 year old male who is status post  right total knee arthroplasty on 3/1 discharged home on 3/4 presenting with persistent dizziness and dyspnea. Patient states he has been dizzy while hospitalized but this has continued to worsen. Dizziness has not worsened with body positioning or turning head. He has also had worsening shortness of breath which again started during hospitalization but is worsened since yesterday. He is now at the point where he cannot transfer to his walker without getting a short of breath. He was placed on prophylactic Coumadin and daughter states his recent INR was 2.0. He denies chest pain, abdominal pain, vomiting or diarrhea but has had some intermittent nausea. He denies history of DVT/PE. Patient is notably tachycardic around 113. Is not hypoxic and not hypotensive. His neurologic exam is nonfocal. No asymmetry is noted of the lower extremities extremities. We'll check CBC, CMP, CT/PE study, right lower extremity venous duplex, chest x-ray and hydrate with fluids.  CT/PE study and right lower  venous duplex were negative for venous thrombus embolism. Upon further discussion with the daughter it appears that he has not been getting regularly scheduled analgesic at home due to adverse reactions in the past. At this point it is quite possible that his symptoms are due to the inadequate pain control. Upon discovery of this he was given dilaudid with improvement in symptoms and heart rate. On reevaluation heart rate improved to low 100s. the patient reports that he felt better. Troponin was checked which was negative. Case was discussed with orthopedist, Dr. Luiz Blare. He agreed that there is nothing left to check and life-threatening postoperative complications have been ruled out. The patient has contact number for Dr. Luiz Blare should they have any further questions. Plan was discussed with patient and family with plans to discharge home. The patient is in agreement and prefers to go home. Patient was able to transfer to  wheelchair without difficulty or becoming severely dyspneic He will followup with orthopedic surgery for reevaluation. The patient and family are both given very strong return precautions and patient was discharged home in stable and improved condition.      Sheran Luz, MD 05/09/11 [phone]

## 2011-05-08 NOTE — ED Notes (Signed)
Pt has a right knee replacement on march 1st, since that time pt has felt dizzy and sob,

## 2011-05-08 NOTE — Progress Notes (Signed)
VASCULAR LAB PRELIMINARY  PRELIMINARY  PRELIMINARY  PRELIMINARY  Right lower extremity venous duplex completed.    Preliminary report:  Right:  No evidence of DVT, superficial thrombosis, or Baker's cyst.   Terance Hart, RVT 05/08/2011, 6:25 PM

## 2011-05-10 NOTE — ED Provider Notes (Signed)
66 y.o.malewho presents with shortness of breath  CV: tachy with regular rhythm, no m/r/g Pulm:CTAB, no c/w/r GI: SNTND, + BS, no guarding or rebound Skin: no rashes noted MSK: moves all 4 extremities symmetrically, no deformities or injuries noted Neuro: CN 2-12 intact, no abnormalities of strength or sensation, A and O x 3  I saw and evaluated the patient, reviewed the resident's note and I agree with the findings and plan.      Cyndra Numbers, MD 05/10/11 [phone]

## 2012-04-30 ENCOUNTER — Encounter: Payer: 59 | Attending: Internal Medicine | Admitting: Dietician

## 2012-04-30 VITALS — Ht 68.5 in | Wt 209.9 lb

## 2012-04-30 DIAGNOSIS — Z713 Dietary counseling and surveillance: Secondary | ICD-10-CM | POA: Insufficient documentation

## 2012-04-30 DIAGNOSIS — E119 Type 2 diabetes mellitus without complications: Secondary | ICD-10-CM | POA: Insufficient documentation

## 2012-05-02 ENCOUNTER — Encounter: Payer: Self-pay | Admitting: Dietician

## 2012-05-02 NOTE — Progress Notes (Signed)
Patient was seen on 04/30/2012 for the first of a series of three diabetes self-management courses at the [facility]. Patient's most recent A1c was 7.6 % on 04/21/2012 The following learning objectives were met by the patient during this course:   Defines the role of glucose and insulin  Identifies type of diabetes and pathophysiology  Defines the diagnostic criteria for diabetes and prediabetes  States the risk factors for Type 2 Diabetes  States the symptoms of Type 2 Diabetes  Defines Type 2 Diabetes treatment goals  Defines Type 2 Diabetes treatment options  States the rationale for glucose monitoring  Identifies A1C, glucose targets, and testing times  Identifies proper sharps disposal  Defines the purpose of a diabetes food plan  Identifies carbohydrate food groups  Defines effects of carbohydrate foods on glucose levels  Identifies carbohydrate choices/grams/food labels  States benefits of physical activity and effect on glucose  Review of suggested activity guidelines  Handouts given during class include:  Type 2 Diabetes: Basics Book  My Food Plan Book  Food and Activity Log   Follow-Up Plan: Core Class 2

## 2012-06-03 ENCOUNTER — Encounter: Payer: 59 | Attending: Internal Medicine | Admitting: Dietician

## 2012-06-03 DIAGNOSIS — E119 Type 2 diabetes mellitus without complications: Secondary | ICD-10-CM | POA: Insufficient documentation

## 2012-06-03 DIAGNOSIS — Z713 Dietary counseling and surveillance: Secondary | ICD-10-CM | POA: Insufficient documentation

## 2012-06-05 NOTE — Progress Notes (Signed)
  Patient was seen on 06/03/2012 for the second of a series of three diabetes self-management courses at the [facility]. The following learning objectives were met by the patient during this course:   Explain basic nutrition maintenance and quality assurance  Describe causes, symptoms and treatment of hypoglycemia and hyperglycemia  Explain how to manage diabetes during illness  Describe the importance of good nutrition for health and healthy eating strategies  List strategies to follow meal plan when dining out  Describe the effects of alcohol on glucose and how to use it safely  Describe problem solving skills for day-to-day glucose challenges  Describe strategies to use when treatment plan needs to change  Identify important factors involved in successful weight loss  Describe ways to remain physically active  Describe the impact of regular activity on insulin resistance   Handouts given in class:  Refrigerator magnet for Sick Day Guidelines  [facility] Oral medication/insulin handout  Weight Loss Handout  Follow-Up Plan: Patient will attend the final class of the ADA Diabetes Self-Care Education.   

## 2012-06-17 ENCOUNTER — Encounter: Payer: 59 | Admitting: *Deleted

## 2012-06-17 DIAGNOSIS — E119 Type 2 diabetes mellitus without complications: Secondary | ICD-10-CM

## 2012-06-17 NOTE — Progress Notes (Signed)
  Patient was seen on 06/17/12 for the third of a series of three diabetes self-management courses at the [facility]. The following learning objectives were met by the patient during this course:    Describe how diabetes changes over time   Identify diabetes complications and ways to prevent them   Describe strategies that can promote heart health including lowering blood pressure and cholesterol   Describe strategies to lower dietary fat and sodium in the diet   Identify physical activities that benefit cardiovascular health   Evaluate success in meeting personal goal   Describe the belief that they can live successfully with diabetes day to day   Establish 2-3 goals that they will plan to diligently work on until they return for the free 77-month follow-up visit  The following handouts were given in class:  3 Month Follow Up Visit handout  Goal setting handout  Class evaluation form  Your patient has established the following 3 month goals for diabetes self-care:  Take diabetes medication as prescribed  Test my glucose BID  Follow-Up Plan: Patient will attend a 3 month follow-up visit for diabetes self-management education.

## 2012-08-27 DIAGNOSIS — C61 Malignant neoplasm of prostate: Secondary | ICD-10-CM

## 2012-08-27 HISTORY — PX: PROSTATE BIOPSY: SHX241

## 2012-08-27 HISTORY — DX: Malignant neoplasm of prostate: C61

## 2012-09-29 ENCOUNTER — Encounter: Payer: Self-pay | Admitting: Radiation Oncology

## 2012-09-29 NOTE — Progress Notes (Signed)
GU Location of Tumor / Histology: prostate  If Prostate Cancer, Gleason Score is (3 + 3) and PSA is (8.60) volume 59.3 cc  Patient presented 2 months ago with signs/symptoms of: rising PSA: 03/2012 7.77  06/24/12 8.60  Biopsies of prostate (if applicable) revealed: adenocarcinoma 6/12 cores  Past/Anticipated interventions by urology, if any: surveillance  Past/Anticipated interventions by medical oncology, if any: none  Weight changes, if any: recent intentional weight loss  Bowel/Bladder complaints, if any:   Nausea/Vomiting, if any: none  Pain issues, if any:  none  SAFETY ISSUES:  Prior radiation? no  Pacemaker/ICD? no  Possible current pregnancy? na  Is the patient on methotrexate? no  Current Complaints / other details:  married

## 2012-09-30 ENCOUNTER — Encounter: Payer: Self-pay | Admitting: Radiation Oncology

## 2012-09-30 ENCOUNTER — Ambulatory Visit
Admission: RE | Admit: 2012-09-30 | Discharge: 2012-09-30 | Disposition: A | Discharge status: Home / Self Care | Payer: 59 | Source: Ambulatory Visit | Attending: Radiation Oncology | Admitting: Radiation Oncology

## 2012-09-30 VITALS — BP 112/61 | HR 75 | Temp 98.0°F | Resp 16 | Wt 196.3 lb

## 2012-09-30 DIAGNOSIS — C61 Malignant neoplasm of prostate: Secondary | ICD-10-CM

## 2012-09-30 HISTORY — DX: Unspecified malignant neoplasm of skin, unspecified: C44.90

## 2012-09-30 HISTORY — DX: Plantar fascial fibromatosis: M72.2

## 2012-09-30 HISTORY — DX: Gastro-esophageal reflux disease without esophagitis: K21.9

## 2012-09-30 HISTORY — DX: Unspecified undescended testicle, unilateral: Q53.10

## 2012-09-30 HISTORY — DX: Malignant neoplasm of prostate: C61

## 2012-09-30 HISTORY — DX: Type 2 diabetes mellitus without complications: E11.9

## 2012-09-30 NOTE — Progress Notes (Signed)
See progress note under physician encounter. 

## 2012-09-30 NOTE — Progress Notes (Signed)
[facility] Radiation Oncology NEW PATIENT EVALUATION  Name: Arthur Klein MRN: [id number]  Date:   09/30/2012           DOB: Sep 19, 1945  Status: outpatient   CC: Jari Favre, MD  Crecencio Mc, MD    REFERRING PHYSICIAN: Crecencio Mc, MD   DIAGNOSIS: Stage TI C. favorable risk adenocarcinoma prostate   HISTORY OF PRESENT ILLNESS:  Arthur Klein is a 67 y.o. male who is seen today for the courtesy of Dr. Laverle Patter for discussion of possible radiation therapy in the management of his stage TI C. favorable risk adenocarcinoma prostate. His wife recalls him having a PSA in the 4 range 3-4 years ago. A PSA in January of 2014 was 7.2, then rising to 8.6 by 06/24/2012. He was referred to Dr. Laverle Patter for further evaluation. Ultrasound-guided biopsies on 08/27/2012 were diagnostic for Gleason 6 (3+3) adenocarcinoma involving 10% of one core from the left lateral base, 25% of one core from the left base, 70% of one core from left lateral mid gland, 40% of one core from the left mid gland, 5% of one core from left lateral apex and 20% of one core from the left apex. His gland volume was 59.3 cc. He is doing well from a GU and GI standpoint. His I PSS score is 1. He is potent.  PREVIOUS RADIATION THERAPY: No   PAST MEDICAL HISTORY:  has a past medical history of Gout; Hypertension; Headache(784.0); Arthritis; Joint pain; Joint swelling; H/O hiatal hernia; Complication of anesthesia; Prostate cancer (08/27/12); Esophageal reflux; Plantar fasciitis; Diabetes mellitus without complication; Skin cancer (2011); and Undescended left testes.     PAST SURGICAL HISTORY:  Past Surgical History  Procedure Laterality Date  . Rotator cuff repair      right  . Rotator cuff repair      left  . Knee arthroscopy      right  . Shoulder arthroscopy      left  . Melanoma excision      stage 3+ lower left lumbar region with axillary node removal  . 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 Knee arthroplasty  05/02/2011    Procedure: COMPUTER ASSISTED TOTAL KNEE ARTHROPLASTY;  Surgeon: Harvie Junior, MD;  Location: [facility] OR;  Service: Orthopedics;  Laterality: Right;  . Prostate biopsy  08/27/12  . Hernia repair       FAMILY HISTORY: family history includes Cancer in his father; Cancer (age of onset: 15) in his brother; Congestive Heart Failure in his mother; Diabetes in his father and mother; Hypertension in his father and mother; and Stroke in his father.  There is no history of Anesthesia problems, and Hypotension, and Malignant hyperthermia, and Pseudochol deficiency, . His father died from "old age" and had a diagnosis of prostate cancer. His older brother was diagnosed with prostate cancer in his late 35s. His mother died from complications of diabetes/cardiac disease at 30.   SOCIAL HISTORY:  reports that he has never smoked. His smokeless tobacco use includes Chew. He reports that he does not drink alcohol or use illicit drugs. Married, 3 children, 7 grandchildren. Retired Oncologist.   ALLERGIES: Erythrocin; Lisinopril; Nsaids; and Other   MEDICATIONS:  Current Outpatient Prescriptions  Medication Sig Dispense Refill  . b complex vitamins capsule Take 1 capsule by mouth daily.      . Bacillus Coagulans-Inulin (PROBIOTIC-PREBIOTIC) 1-250 BILLION-MG CAPS Take 1 capsule by mouth daily.      Marland Kitchen  Cholecalciferol (VITAMIN D) 2000 UNITS CAPS Take 4,000 Units by mouth daily.      Marland Kitchen losartan (COZAAR) 50 MG tablet Take 50 mg by mouth daily.      . MULTIPLE VITAMIN PO Take 1 tablet by mouth daily.      Marland Kitchen acetaminophen (TYLENOL) 500 MG tablet Take 500 mg by mouth every 6 (six) hours as needed.      . cyclobenzaprine (FLEXERIL) 10 MG tablet Take 10 mg by mouth 3 (three) times daily as needed.      . fish oil-omega-3 fatty acids 1000 MG capsule Take 1 g by mouth daily.      . metFORMIN (GLUCOPHAGE) 500 MG tablet Take 500 mg by mouth 2 (two) times daily  with a meal.      . polyethylene glycol (MIRALAX / GLYCOLAX) packet Take 17 g by mouth daily.      . promethazine (PHENERGAN) 12.5 MG tablet Take 2 tablets (25 mg total) by mouth every 6 (six) hours as needed for nausea.  30 tablet  0  . warfarin (COUMADIN) 5 MG tablet Take 2.5-5 mg by mouth daily. Take 1/2 a tablet for 2 days then 1 tablet for 1 day. Alternating back and forth.       No current facility-administered medications for this encounter.     REVIEW OF SYSTEMS:  Pertinent items are noted in HPI.    PHYSICAL EXAM:  weight is 196 lb 4.8 oz (89.041 kg). His oral temperature is 98 F (36.7 C). His blood pressure is 112/61 and his pulse is 75. His respiration is 16 and oxygen saturation is 100%.   Alert and oriented 67 year old white male appearing his stated age. Head and neck examination: Grossly unremarkable. Nodes: Without palpable cervical or supraclavicular lymphadenopathy. Chest: Lungs clear. Back: Without spinal or CVA tenderness. Heart: Regular in rhythm. Abdomen: Without masses organomegaly. Genitalia: Unremarkable to inspection. Rectal: The prostate gland is slightly enlarged and is without focal induration or nodularity. Extremities: Without edema.   LABORATORY DATA:  Lab Results  Component Value Date   WBC 11.3* 05/08/2011   HGB 10.5* 05/08/2011   HCT 31.6* 05/08/2011   MCV 89.0 05/08/2011   PLT 272 05/08/2011   Lab Results  Component Value Date   NA 134* 05/08/2011   K 3.8 05/08/2011   CL 97 05/08/2011   CO2 26 05/08/2011   Lab Results  Component Value Date   ALT 27 05/08/2011   AST 25 05/08/2011   ALKPHOS 74 05/08/2011   BILITOT 0.7 05/08/2011   PSA 8.6 from 06/24/2012.   IMPRESSION: Stage TI C. favorable risk adenocarcinoma prostate. I explained to the patient and his wife that his prognosis is related to his stage, Gleason score, and PSA level. All are favorable. Management options include surgery versus close surveillance versus radiation therapy. Radiation therapy options  include seed implantation alone or 8 weeks of external beam/IMRT. Considering his disease volume and relatively good health, I do not recommend close surveillance. We discussed seed implantation in great detail, and the fact that there may be pubic arch interference based on the volume of his gland. A CT arch study would need to be obtained if you want to consider seed implantation. If there is arch interference, he would need to have downsizing with 3 months of androgen deprivation therapy or at least 6-9 months of finasteride depending on the degree of downsizing necessary. He is sexually active and would not be interested in androgen deprivation therapy for downsizing. His  also candidate for external beam/IMRT which could be given close to home in [place] with Dr. Shanon Payor. If he wants external beam/IMRT in [place],  I defer to Dr. Clovis Riley as to whether not she would like to have Dr. Laverle Patter place 3 gold seed markers for prostate localization/image guidance. We discussed the potential acute and late toxicities of each radiation therapy option. He understands that he would be an excellent candidate for surgery as well. He'll think things over and try to make up his mind within the next few weeks. He was given literature for review.   PLAN: As discussed above.   I spent 60 minutes minutes face to face with the patient and more than 50% of that time was spent in counseling and/or coordination of care.

## 2012-09-30 NOTE — Progress Notes (Signed)
Patient reports on average he gets up once during the night to void. Patient denies blood in urine or stool. Denies diarrhea or pain associated with bowel movements. Reports a strong normal urine stream. Denies difficulty emptying his bladder. Reports he intentionally lost 25-30 pounds January - February of this year because he was diagnosed with high bp and diabetes. Patient reports that since his weight loss he has been taken off metformin. Denies bone pain. Reports occasional dizziness related to effects of right inner ear infection and fluctuation in blood sugar.

## 2012-09-30 NOTE — Progress Notes (Signed)
Complete PATIENT MEASURE OF DISTRESS worksheet with a score of 3 submitted to social work.  

## 2012-10-08 NOTE — Addendum Note (Signed)
Encounter addended by: Angela R Medley on: 10/08/2012 11:24 AM<BR>     Documentation filed: Charges VN

## 2012-10-13 ENCOUNTER — Other Ambulatory Visit: Payer: Self-pay | Admitting: Urology

## 2012-10-19 ENCOUNTER — Encounter (HOSPITAL_COMMUNITY): Payer: Self-pay | Admitting: Pharmacy Technician

## 2012-10-27 ENCOUNTER — Encounter (HOSPITAL_COMMUNITY)
Admission: RE | Admit: 2012-10-27 | Discharge: 2012-10-27 | Disposition: A | Discharge status: Home / Self Care | Payer: 59 | Source: Ambulatory Visit | Attending: Urology | Admitting: Urology

## 2012-10-27 ENCOUNTER — Encounter (HOSPITAL_COMMUNITY): Payer: Self-pay

## 2012-10-27 DIAGNOSIS — E119 Type 2 diabetes mellitus without complications: Secondary | ICD-10-CM | POA: Insufficient documentation

## 2012-10-27 DIAGNOSIS — Z01812 Encounter for preprocedural laboratory examination: Secondary | ICD-10-CM | POA: Insufficient documentation

## 2012-10-27 DIAGNOSIS — I10 Essential (primary) hypertension: Secondary | ICD-10-CM | POA: Insufficient documentation

## 2012-10-27 DIAGNOSIS — C61 Malignant neoplasm of prostate: Secondary | ICD-10-CM | POA: Insufficient documentation

## 2012-10-27 DIAGNOSIS — Z01810 Encounter for preprocedural cardiovascular examination: Secondary | ICD-10-CM | POA: Insufficient documentation

## 2012-10-27 DIAGNOSIS — M439 Deforming dorsopathy, unspecified: Secondary | ICD-10-CM | POA: Insufficient documentation

## 2012-10-27 DIAGNOSIS — Z01818 Encounter for other preprocedural examination: Secondary | ICD-10-CM | POA: Insufficient documentation

## 2012-10-27 LAB — CBC
HCT: 44.2 % (ref 39.0–52.0)
MCHC: 33.9 g/dL (ref 30.0–36.0)
Platelets: 151 10*3/uL (ref 150–400)
RDW: 13.5 % (ref 11.5–15.5)
WBC: 8.1 10*3/uL (ref 4.0–10.5)

## 2012-10-27 LAB — BASIC METABOLIC PANEL
BUN: 19 mg/dL (ref 6–23)
Calcium: 9.8 mg/dL (ref 8.4–10.5)
Chloride: 101 mEq/L (ref 96–112)
Creatinine, Ser: 0.89 mg/dL (ref 0.50–1.35)
GFR calc Af Amer: >90 mL/min (ref >90)
GFR calc non Af Amer: 88 mL/min — ABNORMAL LOW (ref >90)

## 2012-10-27 NOTE — Patient Instructions (Addendum)
20 Arthur Klein  10/27/2012   Your procedure is scheduled on: 9-4  -2014  Report to [facility] at     0515   AM .  Call this number if you have problems the morning of surgery: [phone]  Or Presurgical Testing [phone](Lyn Joens)   Remember: Follow any bowel prep instructions per MD office.    Do not eat food:After Midnight.  .  Take these medicines the morning of surgery with A SIP OF WATER: none. Antihistamine okay.   Do not wear jewelry, make-up or nail polish.  Do not wear lotions, powders, or perfumes. You may wear deodorant.  Do not shave 12 hours prior to first CHG shower(legs and under arms).(face and neck okay.)  Do not bring valuables to the hospital.  Contacts, dentures or bridgework,body piercing,  may not be worn into surgery.  Leave suitcase in the car. After surgery it may be brought to your room.  For patients admitted to the hospital, checkout time is 11:00 AM the day of discharge.   Patients discharged the day of surgery will not be allowed to drive home. Must have responsible person with you x 24 hours once discharged.  Name and phone number of your driver: Marbin Olshefski -spouse [phone] cell  Special Instructions: CHG(Chlorhedine 4%-"Hibiclens","Betasept","Aplicare") Shower Use Special Wash: see special instructions.(avoid face and genitals)   Please read over the following fact sheets that you were given:  Blood Transfusion fact sheet, Incentive Spirometry Instruction.    Failure to follow these instructions may result in Cancellation of your surgery.   Patient signature_______________________________________________________

## 2012-10-27 NOTE — Pre-Procedure Instructions (Signed)
10-27-12 EKG/ CXR done today

## 2012-11-04 ENCOUNTER — Encounter (HOSPITAL_COMMUNITY)
Admission: RE | Disposition: A | Discharge status: Home / Self Care | Payer: Self-pay | Source: Ambulatory Visit | Attending: Urology

## 2012-11-04 ENCOUNTER — Encounter (HOSPITAL_COMMUNITY): Payer: Self-pay | Admitting: Anesthesiology

## 2012-11-04 ENCOUNTER — Ambulatory Visit (HOSPITAL_COMMUNITY): Payer: 59 | Admitting: Anesthesiology

## 2012-11-04 ENCOUNTER — Observation Stay (HOSPITAL_COMMUNITY)
Admission: RE | Admit: 2012-11-04 | Discharge: 2012-11-05 | Disposition: A | Discharge status: Home / Self Care | Payer: 59 | Source: Ambulatory Visit | Attending: Urology | Admitting: Urology

## 2012-11-04 ENCOUNTER — Encounter (HOSPITAL_COMMUNITY): Payer: Self-pay | Admitting: *Deleted

## 2012-11-04 DIAGNOSIS — E119 Type 2 diabetes mellitus without complications: Secondary | ICD-10-CM | POA: Insufficient documentation

## 2012-11-04 DIAGNOSIS — Z85820 Personal history of malignant melanoma of skin: Secondary | ICD-10-CM | POA: Insufficient documentation

## 2012-11-04 DIAGNOSIS — K219 Gastro-esophageal reflux disease without esophagitis: Secondary | ICD-10-CM | POA: Insufficient documentation

## 2012-11-04 DIAGNOSIS — C61 Malignant neoplasm of prostate: Principal | ICD-10-CM | POA: Insufficient documentation

## 2012-11-04 DIAGNOSIS — I10 Essential (primary) hypertension: Secondary | ICD-10-CM | POA: Insufficient documentation

## 2012-11-04 DIAGNOSIS — M722 Plantar fascial fibromatosis: Secondary | ICD-10-CM | POA: Insufficient documentation

## 2012-11-04 DIAGNOSIS — Z79899 Other long term (current) drug therapy: Secondary | ICD-10-CM | POA: Insufficient documentation

## 2012-11-04 HISTORY — PX: ROBOT ASSISTED LAPAROSCOPIC RADICAL PROSTATECTOMY: SHX5141

## 2012-11-04 LAB — TYPE AND SCREEN
ABO/RH(D): A POS
Antibody Screen: NEGATIVE

## 2012-11-04 SURGERY — ROBOTIC ASSISTED LAPAROSCOPIC RADICAL PROSTATECTOMY LEVEL 1
Anesthesia: General | Wound class: Clean Contaminated

## 2012-11-04 MED ORDER — DIPHENHYDRAMINE HCL 12.5 MG/5ML PO ELIX: 12.5000 mg | ORAL_SOLUTION | Freq: Four times a day (QID) | ORAL | Status: DC | PRN

## 2012-11-04 MED ORDER — BUPIVACAINE-EPINEPHRINE 0.25% -1:200000 IJ SOLN
INTRAMUSCULAR | Status: DC | PRN
  Administered 2012-11-04: 29 mL

## 2012-11-04 MED ORDER — CEFAZOLIN SODIUM-DEXTROSE 2-3 GM-% IV SOLR
INTRAVENOUS | Status: AC
  Filled 2012-11-04: qty 50

## 2012-11-04 MED ORDER — PROPOFOL 10 MG/ML IV BOLUS
INTRAVENOUS | Status: DC | PRN
  Administered 2012-11-04: 150 mg via INTRAVENOUS

## 2012-11-04 MED ORDER — LACTATED RINGERS IV SOLN: INTRAVENOUS | Status: DC

## 2012-11-04 MED ORDER — MORPHINE SULFATE 2 MG/ML IJ SOLN
2.0000 mg | INTRAMUSCULAR | Status: DC | PRN
  Filled 2012-11-04: qty 1

## 2012-11-04 MED ORDER — LOSARTAN POTASSIUM 50 MG PO TABS
50.0000 mg | ORAL_TABLET | Freq: Every morning | ORAL | Status: DC
  Administered 2012-11-04 – 2012-11-05 (×2): 50 mg via ORAL
  Filled 2012-11-04 (×2): qty 1

## 2012-11-04 MED ORDER — GLYCOPYRROLATE 0.2 MG/ML IJ SOLN
INTRAMUSCULAR | Status: DC | PRN
  Administered 2012-11-04: 0.6 mg via INTRAVENOUS

## 2012-11-04 MED ORDER — CEFAZOLIN SODIUM 1-5 GM-% IV SOLN
1.0000 g | Freq: Three times a day (TID) | INTRAVENOUS | Status: AC
  Administered 2012-11-04 (×2): 1 g via INTRAVENOUS
  Filled 2012-11-04 (×2): qty 50

## 2012-11-04 MED ORDER — INSULIN ASPART 100 UNIT/ML ~~LOC~~ SOLN
0.0000 [IU] | SUBCUTANEOUS | Status: DC
  Administered 2012-11-04: 12:00:00 3 [IU] via SUBCUTANEOUS
  Administered 2012-11-04 – 2012-11-05 (×3): 2 [IU] via SUBCUTANEOUS

## 2012-11-04 MED ORDER — ACETAMINOPHEN 10 MG/ML IV SOLN
1000.0000 mg | Freq: Four times a day (QID) | INTRAVENOUS | Status: AC
  Administered 2012-11-04 – 2012-11-05 (×4): 1000 mg via INTRAVENOUS
  Filled 2012-11-04 (×4): qty 100

## 2012-11-04 MED ORDER — ONDANSETRON HCL 4 MG/2ML IJ SOLN
INTRAMUSCULAR | Status: DC | PRN
  Administered 2012-11-04: 4 mg via INTRAVENOUS

## 2012-11-04 MED ORDER — FENTANYL CITRATE 0.05 MG/ML IJ SOLN
INTRAMUSCULAR | Status: DC | PRN
  Administered 2012-11-04: 50 ug via INTRAVENOUS
  Administered 2012-11-04: 100 ug via INTRAVENOUS
  Administered 2012-11-04 (×2): 50 ug via INTRAVENOUS

## 2012-11-04 MED ORDER — HYDROMORPHONE HCL PF 1 MG/ML IJ SOLN
INTRAMUSCULAR | Status: AC
  Filled 2012-11-04: qty 1

## 2012-11-04 MED ORDER — HEPARIN SODIUM (PORCINE) 1000 UNIT/ML IJ SOLN
INTRAMUSCULAR | Status: AC
  Filled 2012-11-04: qty 1

## 2012-11-04 MED ORDER — LACTATED RINGERS IV SOLN
INTRAVENOUS | Status: DC | PRN
  Administered 2012-11-04: 07:00:00

## 2012-11-04 MED ORDER — INDIGOTINDISULFONATE SODIUM 8 MG/ML IJ SOLN
INTRAMUSCULAR | Status: AC
  Filled 2012-11-04: qty 10

## 2012-11-04 MED ORDER — PROMETHAZINE HCL 25 MG/ML IJ SOLN: 6.2500 mg | INTRAMUSCULAR | Status: DC | PRN

## 2012-11-04 MED ORDER — SUCCINYLCHOLINE CHLORIDE 20 MG/ML IJ SOLN
INTRAMUSCULAR | Status: DC | PRN
  Administered 2012-11-04: 100 mg via INTRAVENOUS

## 2012-11-04 MED ORDER — MIDAZOLAM HCL 5 MG/5ML IJ SOLN
INTRAMUSCULAR | Status: DC | PRN
  Administered 2012-11-04: 2 mg via INTRAVENOUS

## 2012-11-04 MED ORDER — CEFAZOLIN SODIUM-DEXTROSE 2-3 GM-% IV SOLR
2.0000 g | INTRAVENOUS | Status: AC
  Administered 2012-11-04: 2 g via INTRAVENOUS

## 2012-11-04 MED ORDER — DOCUSATE SODIUM 100 MG PO CAPS
100.0000 mg | ORAL_CAPSULE | Freq: Two times a day (BID) | ORAL | Status: DC
  Administered 2012-11-04 – 2012-11-05 (×3): 100 mg via ORAL
  Filled 2012-11-04 (×4): qty 1

## 2012-11-04 MED ORDER — LACTATED RINGERS IV SOLN
INTRAVENOUS | Status: DC | PRN
  Administered 2012-11-04 (×2): via INTRAVENOUS

## 2012-11-04 MED ORDER — DIPHENHYDRAMINE HCL 50 MG/ML IJ SOLN: 12.5000 mg | Freq: Four times a day (QID) | INTRAMUSCULAR | Status: DC | PRN

## 2012-11-04 MED ORDER — BUPIVACAINE-EPINEPHRINE PF 0.25-1:200000 % IJ SOLN
INTRAMUSCULAR | Status: AC
  Filled 2012-11-04: qty 30

## 2012-11-04 MED ORDER — SODIUM CHLORIDE 0.9 % IV BOLUS (SEPSIS)
1000.0000 mL | Freq: Once | INTRAVENOUS | Status: AC
  Administered 2012-11-04: 1000 mL via INTRAVENOUS

## 2012-11-04 MED ORDER — CIPROFLOXACIN HCL 500 MG PO TABS: 500.0000 mg | ORAL_TABLET | Freq: Two times a day (BID) | ORAL | Status: DC

## 2012-11-04 MED ORDER — CIPROFLOXACIN IN D5W 400 MG/200ML IV SOLN
400.0000 mg | INTRAVENOUS | Status: AC
  Administered 2012-11-04: 400 mg via INTRAVENOUS

## 2012-11-04 MED ORDER — KCL IN DEXTROSE-NACL 20-5-0.45 MEQ/L-%-% IV SOLN
INTRAVENOUS | Status: AC
  Filled 2012-11-04: qty 1000

## 2012-11-04 MED ORDER — INDIGOTINDISULFONATE SODIUM 8 MG/ML IJ SOLN
INTRAMUSCULAR | Status: DC | PRN
  Administered 2012-11-04 (×2): 5 mL via INTRAVENOUS

## 2012-11-04 MED ORDER — KCL IN DEXTROSE-NACL 20-5-0.45 MEQ/L-%-% IV SOLN
INTRAVENOUS | Status: DC
  Administered 2012-11-04: 18:00:00 via INTRAVENOUS
  Administered 2012-11-04: 1000 mL via INTRAVENOUS
  Administered 2012-11-05: 01:00:00 via INTRAVENOUS
  Filled 2012-11-04 (×6): qty 1000

## 2012-11-04 MED ORDER — HYDROMORPHONE HCL PF 1 MG/ML IJ SOLN
0.2500 mg | INTRAMUSCULAR | Status: DC | PRN
  Administered 2012-11-04 (×3): 0.5 mg via INTRAVENOUS

## 2012-11-04 MED ORDER — SODIUM CHLORIDE 0.9 % IR SOLN
Status: DC | PRN
  Administered 2012-11-04: 1000 mL

## 2012-11-04 MED ORDER — LIDOCAINE HCL (CARDIAC) 20 MG/ML IV SOLN
INTRAVENOUS | Status: DC | PRN
  Administered 2012-11-04: 50 mg via INTRAVENOUS

## 2012-11-04 MED ORDER — HYDROCODONE-ACETAMINOPHEN 5-325 MG PO TABS: 1.0000 | ORAL_TABLET | Freq: Four times a day (QID) | ORAL | Status: DC | PRN

## 2012-11-04 MED ORDER — MEPERIDINE HCL 50 MG/ML IJ SOLN: 6.2500 mg | INTRAMUSCULAR | Status: DC | PRN

## 2012-11-04 MED ORDER — NEOSTIGMINE METHYLSULFATE 1 MG/ML IJ SOLN
INTRAMUSCULAR | Status: DC | PRN
  Administered 2012-11-04: 4 mg via INTRAVENOUS

## 2012-11-04 MED ORDER — ROCURONIUM BROMIDE 100 MG/10ML IV SOLN
INTRAVENOUS | Status: DC | PRN
  Administered 2012-11-04 (×2): 10 mg via INTRAVENOUS
  Administered 2012-11-04: 40 mg via INTRAVENOUS
  Administered 2012-11-04: 10 mg via INTRAVENOUS

## 2012-11-04 MED ORDER — HYDROMORPHONE HCL PF 1 MG/ML IJ SOLN
INTRAMUSCULAR | Status: DC | PRN
  Administered 2012-11-04: 0.5 mg via INTRAVENOUS
  Administered 2012-11-04: 1 mg via INTRAVENOUS
  Administered 2012-11-04: 0.5 mg via INTRAVENOUS

## 2012-11-04 MED ORDER — STERILE WATER FOR IRRIGATION IR SOLN
Status: DC | PRN
  Administered 2012-11-04: 3000 mL

## 2012-11-04 MED ORDER — CIPROFLOXACIN IN D5W 400 MG/200ML IV SOLN
INTRAVENOUS | Status: AC
  Filled 2012-11-04: qty 200

## 2012-11-04 SURGICAL SUPPLY — 44 items
CANISTER SUCTION 2500CC (MISCELLANEOUS) ×2 IMPLANT
CATH FOLEY 2WAY SLVR 18FR 30CC (CATHETERS) ×2 IMPLANT
CATH ROBINSON RED A/P 16FR (CATHETERS) ×2 IMPLANT
CATH ROBINSON RED A/P 8FR (CATHETERS) ×2 IMPLANT
CATH TIEMANN FOLEY 18FR 5CC (CATHETERS) ×2 IMPLANT
CHLORAPREP W/TINT 26ML (MISCELLANEOUS) ×2 IMPLANT
CLIP LIGATING HEM O LOK PURPLE (MISCELLANEOUS) ×2 IMPLANT
CLOTH BEACON ORANGE TIMEOUT ST (SAFETY) ×2 IMPLANT
CORD HIGH FREQUENCY UNIPOLAR (ELECTROSURGICAL) ×2 IMPLANT
COVER SURGICAL LIGHT HANDLE (MISCELLANEOUS) ×2 IMPLANT
COVER TIP SHEARS 8 DVNC (MISCELLANEOUS) ×1 IMPLANT
COVER TIP SHEARS 8MM DA VINCI (MISCELLANEOUS) ×1
CUTTER ECHEON FLEX ENDO 45 340 (ENDOMECHANICALS) ×2 IMPLANT
DECANTER SPIKE VIAL GLASS SM (MISCELLANEOUS) ×2 IMPLANT
DERMABOND ADVANCED (GAUZE/BANDAGES/DRESSINGS) ×1
DERMABOND ADVANCED .7 DNX12 (GAUZE/BANDAGES/DRESSINGS) ×1 IMPLANT
DRAPE SURG IRRIG POUCH 19X23 (DRAPES) ×2 IMPLANT
DRSG TEGADERM 2-3/8X2-3/4 SM (GAUZE/BANDAGES/DRESSINGS) ×8 IMPLANT
DRSG TEGADERM 4X4.75 (GAUZE/BANDAGES/DRESSINGS) ×4 IMPLANT
DRSG TEGADERM 6X8 (GAUZE/BANDAGES/DRESSINGS) ×4 IMPLANT
ELECT REM PT RETURN 9FT ADLT (ELECTROSURGICAL) ×2
ELECTRODE REM PT RTRN 9FT ADLT (ELECTROSURGICAL) ×1 IMPLANT
GAUZE SPONGE 2X2 8PLY STRL LF (GAUZE/BANDAGES/DRESSINGS) ×1 IMPLANT
GLOVE BIO SURGEON STRL SZ 6.5 (GLOVE) ×2 IMPLANT
GLOVE BIOGEL M STRL SZ7.5 (GLOVE) ×4 IMPLANT
GOWN STRL NON-REIN LRG LVL3 (GOWN DISPOSABLE) ×4 IMPLANT
GOWN STRL REIN XL XLG (GOWN DISPOSABLE) ×4 IMPLANT
HOLDER FOLEY CATH W/STRAP (MISCELLANEOUS) ×2 IMPLANT
IV LACTATED RINGERS 1000ML (IV SOLUTION) IMPLANT
KIT ACCESSORY DA VINCI DISP (KITS) ×1
KIT ACCESSORY DVNC DISP (KITS) ×1 IMPLANT
NDL SAFETY ECLIPSE 18X1.5 (NEEDLE) ×1 IMPLANT
NEEDLE HYPO 18GX1.5 SHARP (NEEDLE) ×1
PACK ROBOT UROLOGY CUSTOM (CUSTOM PROCEDURE TRAY) ×2 IMPLANT
RELOAD GREEN ECHELON 45 (STAPLE) ×2 IMPLANT
SET TUBE IRRIG SUCTION NO TIP (IRRIGATION / IRRIGATOR) ×2 IMPLANT
SOLUTION ELECTROLUBE (MISCELLANEOUS) ×2 IMPLANT
SPONGE GAUZE 2X2 STER 10/PKG (GAUZE/BANDAGES/DRESSINGS) ×1
SUT ETHILON 3 0 PS 1 (SUTURE) ×2 IMPLANT
SUT MNCRL AB 4-0 PS2 18 (SUTURE) ×4 IMPLANT
SUT VICRYL 0 UR6 27IN ABS (SUTURE) ×4 IMPLANT
SYR 27GX1/2 1ML LL SAFETY (SYRINGE) ×2 IMPLANT
TOWEL OR NON WOVEN STRL DISP B (DISPOSABLE) ×2 IMPLANT
WATER STERILE IRR 1500ML POUR (IV SOLUTION) IMPLANT

## 2012-11-04 NOTE — Transfer of Care (Signed)
Immediate Anesthesia Transfer of Care Note  Patient: Arthur Klein  Procedure(s) Performed: Procedure(s) (LRB): ROBOTIC ASSISTED LAPAROSCOPIC RADICAL PROSTATECTOMY LEVEL 1 (N/A)  Patient Location: PACU  Anesthesia Type: General  Level of Consciousness: sedated, patient cooperative and responds to stimulaton  Airway & Oxygen Therapy: Patient Spontanous Breathing and Patient connected to face mask oxgen  Post-op Assessment: Report given to PACU RN and Post -op Vital signs reviewed and stable  Post vital signs: Reviewed and stable  Complications: No apparent anesthesia complications

## 2012-11-04 NOTE — Progress Notes (Signed)
Patient ID: Arthur Klein, male   DOB: Mar 01, 1946, 67 y.o.   MRN: [id number]  Pt still somewhat dizzy and somnolent after anesthesia.  Urine fairly red but draining well and excellent output.  I hand irrigated catheter and no clots noted within the bladder.  Continue to monitor.  Irrigate catheter prn.

## 2012-11-04 NOTE — H&P (Signed)
  History of Present Illness  [name] is a 67 year old who was found to have an elevated PSA of 8.60 which prompted a prostate biopsy on 08/27/12.  This demonstrated Gleason 3+3=6 adenocarcinoma in 6 out of 12 biopsy cores. He has no family history of prostate cancer.  TNM stage: cT1c Nx Mx PSA: 8.60 Gleason score: 3+3=6 Biopsy (08/27/12): 6/12 cores positive -- L lateral apex (5%), L apex (20%), L lateral mid (70%), L mid (40%, PNI), L lateral base (10%, PNI), L base (25%) Prostate volume: 59.3 cc  Nomogram OC disease: 82% EPE: 11% SVI: 4% LNI: 1.7% PFS (surgery): 95% at 5 years, 92% at 10 years  Urinary function: IPSS is 1. Erectile function: SHIM score is 24.    Past Medical History Problems  1. History of  Diabetes Mellitus 250.00 2. History of  Esophageal Reflux 530.81 3. History of  Hypertension 401.9 4. History of  Malignant Melanoma Of The Skin V10.82 5. History of  Plantar Fasciitis 728.71   He is diabetic but has recently been able to stop metformin after making lifestyle changes.  He remains disease-free from his prior diagnosis of melanoma.   Surgical History Problems  1. History of  Biopsy Lymph Node 2. History of  Hernia Repair 3. History of  Knee Replacement Right 4. History of  Shoulder Arthroscopy With Rotator Cuff Repair  Current Meds 1. B Complex CAPS; Therapy: (Recorded:11Jun2014) to 2. Losartan Potassium 50 MG Oral Tablet; Therapy: (Recorded:11Jun2014) to 3. Multi-Vitamin TABS; Therapy: (Recorded:11Jun2014) to 4. Probiotic CAPS; Therapy: (Recorded:11Jun2014) to 5. Vitamin D 1000 UNIT Oral Tablet; Therapy: (Recorded:11Jun2014) to  Allergies Medication  1. Lisinopril TABS 2. OxyCODONE HCl TABS 3. NSAIDs  Family History Problems  1. Fraternal history of  Carcinoma Of The Pancreas 2. Maternal history of  Congestive Heart Failure 3. Maternal history of  Diabetes Mellitus V18.0 4. Fraternal history of  Diabetes Mellitus V18.0 5. Maternal  history of  Hypertension V17.49 6. Fraternal history of  Hypertension V17.49 7. Fraternal history of  Prostate Cancer V16.42 8. Fraternal history of  Stroke Syndrome V17.1  Social History Problems  1. Marital History - Currently Married 2. Never A Smoker 3. Tobacco Use 305.1 chewing tobacco every day Denied  4. History of  Alcohol Use   Physical Exam Constitutional: Well nourished and well developed . No acute distress.  ENT:. The ears and nose are normal in appearance.  Neck: The appearance of the neck is normal and no neck mass is present.  Pulmonary: No respiratory distress, normal respiratory rhythm and effort and clear bilateral breath sounds.  Cardiovascular: Heart rate and rhythm are normal . No peripheral edema.  Abdomen: The abdomen is soft and nontender. No masses are palpated. No CVA tenderness. No hernias are palpable. No hepatosplenomegaly noted. He has a well-healed incision on his left flank from his melanoma excision.   Discussion/Summary  1.  Prostate cancer: He has elected to proceed with surgical treatment and will undergo a robotic prostatectomy.

## 2012-11-04 NOTE — Op Note (Signed)
Preoperative diagnosis: Clinically localized adenocarcinoma of the prostate (clinical stage T1c Nx Mx)  Postoperative diagnosis: Clinically localized adenocarcinoma of the prostate (clinical stage T1c Nx Mx)  Procedure:  1. Robotic assisted laparoscopic radical prostatectomy (bilateral nerve sparing)  Surgeon: Rolly Salter, Montez Hageman. M.D.  Assistant: Pecola Leisure, PA-C  Anesthesia: General  Complications: None  EBL: 100 mL  IVF:  1500 mL crystalloid  Specimens: 1. Prostate and seminal vesicles  Disposition of specimens: Pathology  Drains: 1. 20 Fr coude catheter 2. # 19 Blake pelvic drain  Indication: Arthur Klein is a 67 y.o. year old patient with clinically localized prostate cancer.  After a thorough review of the management options for treatment of prostate cancer, he elected to proceed with surgical therapy and the above procedure(s).  We have discussed the potential benefits and risks of the procedure, side effects of the proposed treatment, the likelihood of the patient achieving the goals of the procedure, and any potential problems that might occur during the procedure or recuperation. Informed consent has been obtained.  Description of procedure:  The patient was taken to the operating room and a general anesthetic was administered. He was given preoperative antibiotics, placed in the dorsal lithotomy position, and prepped and draped in the usual sterile fashion. Next a preoperative timeout was performed. A urethral catheter was placed into the bladder and a site was selected near the umbilicus for placement of the camera port. This was placed using a standard open Hassan technique which allowed entry into the peritoneal cavity under direct vision and without difficulty. A 12 mm port was placed and a pneumoperitoneum established. The camera was then used to inspect the abdomen and there was no evidence of any intra-abdominal injuries or other abnormalities. The remaining  abdominal ports were then placed. 8 mm robotic ports were placed in the right lower quadrant, left lower quadrant, and far left lateral abdominal wall. A 5 mm port was placed in the right upper quadrant and a 12 mm port was placed in the right lateral abdominal wall for laparoscopic assistance. All ports were placed under direct vision without difficulty. The surgical cart was then docked.   Utilizing the cautery scissors, the bladder was reflected posteriorly allowing entry into the space of Retzius and identification of the endopelvic fascia and prostate. The periprostatic fat was then removed from the prostate allowing full exposure of the endopelvic fascia. The endopelvic fascia was then incised from the apex back to the base of the prostate bilaterally and the underlying levator muscle fibers were swept laterally off the prostate thereby isolating the dorsal venous complex. The dorsal vein was then stapled and divided with a 45 mm Flex Echelon stapler. Attention then turned to the bladder neck which was divided anteriorly thereby allowing entry into the bladder and exposure of the urethral catheter. The catheter balloon was deflated and the catheter was brought into the operative field and used to retract the prostate anteriorly. The posterior bladder neck was then examined and was divided allowing further dissection between the bladder and prostate posteriorly until the vasa deferentia and seminal vessels were identified. The vasa deferentia were isolated, divided, and lifted anteriorly. The seminal vesicles were dissected down to their tips with care to control the seminal vascular arterial blood supply. These structures were then lifted anteriorly and the space between Denonvillier's fascia and the anterior rectum was developed with a combination of sharp and blunt dissection. This isolated the vascular pedicles of the prostate.  The lateral prostatic fascia  was then sharply incised allowing release of  the neurovascular bundles bilaterally. The vascular pedicles of the prostate were then ligated with Weck clips between the prostate and neurovascular bundles and divided with sharp cold scissor dissection resulting in neurovascular bundle preservation. The neurovascular bundles were then separated off the apex of the prostate and urethra bilaterally.  The urethra was then sharply transected allowing the prostate specimen to be disarticulated. The pelvis was copiously irrigated and hemostasis was ensured. There was no evidence for rectal injury.  Attention then turned to the urethral anastomosis. A 2-0 Vicryl slip knot was placed between Denonvillier's fascia, the posterior bladder neck, and the posterior urethra to reapproximate these structures. A double-armed 3-0 Monocryl suture was then used to perform a 360 running tension-free anastomosis between the bladder neck and urethra. A new urethral catheter was then placed into the bladder and irrigated. There were no blood clots within the bladder and the anastomosis appeared to be watertight. A #19 Blake drain was then brought through the left lateral 8 mm port site and positioned appropriately within the pelvis. It was secured to the skin with a nylon suture. The surgical cart was then undocked. The right lateral 12 mm port site was closed at the fascial level with a 0 Vicryl suture placed laparoscopically. All remaining ports were then removed under direct vision. The prostate specimen was removed intact within the Endopouch retrieval bag via the periumbilical camera port site. This fascial opening was closed with two running 0 Vicryl sutures. 0.25% Marcaine was then injected into all port sites and all incisions were reapproximated at the skin level with 4-0 monocryl subcuticular sutures. Dermabond was applied. The patient appeared to tolerate the procedure well and without complications. The patient was able to be extubated and transferred to the recovery  unit in satisfactory condition.  Moody Bruins MD

## 2012-11-04 NOTE — Anesthesia Preprocedure Evaluation (Addendum)
Anesthesia Evaluation    History of Anesthesia Complications (+) Emergence Delirium  Airway Mallampati: III TM Distance: >3 FB Neck ROM: Full    Dental no notable dental hx. (+) Caps   Pulmonary  breath sounds clear to auscultation  Pulmonary exam normal       Cardiovascular hypertension, Pt. on medications Rhythm:Regular Rate:Normal     Neuro/Psych    GI/Hepatic hiatal hernia,   Endo/Other  diabetes  Renal/GU      Musculoskeletal   Abdominal   Peds  Hematology   Anesthesia Other Findings   Reproductive/Obstetrics                          Anesthesia Physical Anesthesia Plan  ASA: II  Anesthesia Plan: General   Post-op Pain Management:    Induction: Intravenous  Airway Management Planned: Oral ETT  Additional Equipment:   Intra-op Plan:   Post-operative Plan: Extubation in OR  Informed Consent: I have reviewed the patients History and Physical, chart, labs and discussed the procedure including the risks, benefits and alternatives for the proposed anesthesia with the patient or authorized representative who has indicated his/her understanding and acceptance.   Dental advisory given  Plan Discussed with: CRNA  Anesthesia Plan Comments:         Anesthesia Quick Evaluation                                   Anesthesia Evaluation  Patient identified by MRN, date of birth, ID band Patient awake    Reviewed: Allergy & Precautions, H&P , NPO status , Patient's Chart, lab work & pertinent test results  History of Anesthesia Complications (+) Emergence Delirium  Airway Mallampati: I TM Distance: >3 FB Neck ROM: Full    Dental  (+) Teeth Intact, Dental Advisory Given, Partial Lower and Caps   Pulmonary neg pulmonary ROS, Current Smoker (smokeless tobacco),  clear to auscultation        Cardiovascular Exercise Tolerance: Good neg cardio ROS Regular Normal    Neuro/Psych  Headaches, Negative Psych ROS   GI/Hepatic Neg liver ROS, hiatal hernia,   Endo/Other  Negative Endocrine ROS  Renal/GU negative Renal ROS     Musculoskeletal  (+) Arthritis -, Osteoarthritis,    Abdominal   Peds negative pediatric ROS (+)  Hematology negative hematology ROS (+)   Anesthesia Other Findings   Reproductive/Obstetrics negative OB ROS                          Anesthesia Physical Anesthesia Plan  ASA: II  Anesthesia Plan: General   Post-op Pain Management:    Induction: Intravenous  Airway Management Planned: LMA  Additional Equipment:   Intra-op Plan:   Post-operative Plan: Extubation in OR  Informed Consent: I have reviewed the patients History and Physical, chart, labs and discussed the procedure including the risks, benefits and alternatives for the proposed anesthesia with the patient or authorized representative who has indicated his/her understanding and acceptance.   Dental advisory given  Plan Discussed with: CRNA, Anesthesiologist and Surgeon  Anesthesia Plan Comments:         Anesthesia Quick Evaluation

## 2012-11-04 NOTE — Discharge Instructions (Signed)
1. Activity:  You are encouraged to ambulate frequently (about every hour during waking hours) to help prevent blood clots from forming in your legs or lungs.  However, you should not engage in any heavy lifting (> 10-15 lbs), strenuous activity, or straining. °2. Diet: You should continue a clear liquid diet until passing gas from below.  Once this occurs, you may advance your diet to a soft diet that would be easy to digest (i.e soups, scrambled eggs, mashed potatoes, etc.) for 24 hours just as you would if getting over a bad stomach flu.  If tolerating this diet well for 24 hours, you may then begin eating regular food.  It will be normal to have some amount of bloating, nausea, and abdominal discomfort intermittently. °3. Prescriptions:  You will be provided a prescription for pain medication to take as needed.  If your pain is not severe enough to require the prescription pain medication, you may take extra strength Tylenol instead.  You should also take an over the counter stool softener (Colace 100 mg twice daily) to avoid straining with bowel movements as the pain medication may constipate you. Finally, you will also be provided a prescription for an antibiotic to begin the day prior to your return visit in the office for catheter removal. °4. Catheter care: You will be taught how to take care of the catheter by the nursing staff prior to discharge from the hospital.  You may use both a leg bag and the larger bedside bag but it is recommended to at least use the bigger bedside bag at nighttime as the leg bag is small and will fill up overnight and also does not drain as well when lying flat. You may periodically feel a strong urge to void with the catheter in place.  This is a bladder spasm and most often can occur when having a bowel movement or when you are moving around. It is typically self-limited and usually will stop after a few minutes.  You may use some Vaseline or Neosporin around the tip of the  catheter to reduce friction at the tip of the penis. °5. Incisions: You may remove your dressing bandages the 2nd day after surgery.  You most likely will have a few small staples in each of the incisions and once the bandages are removed, the incisions may stay open to air.  You may start showering (not soaking or bathing in water) 48 hours after surgery and the incisions simply need to be patted dry after the shower.  No additional care is needed. °6. What to call us about: You should call the office ([phone]) if you develop fever > 101, persistent vomiting, or the catheter stops draining. Also, feel free to call with any other questions you may have and remember the handout that was provided to you as a reference preoperatively which answers many of the common questions that arise after surgery. ° °You may resume aspirin, vitamins, and supplements 7 days after surgery. °

## 2012-11-04 NOTE — Care Management Note (Addendum)
    Page 1 of 1   11/05/2012     4:17:28 PM   CARE MANAGEMENT NOTE 11/05/2012  Patient:  Arthur Klein, Arthur Klein   Account Number:  [id number]  Date Initiated:  11/04/2012  Documentation initiated by:  Lanier Clam  Subjective/Objective Assessment:   67Y/O M ADMITTED W/PROSTATECA.     Action/Plan:   FROM HOME W/SPOUSE.HAS PCP,PHARMACY.   Anticipated DC Date:  11/05/2012   Anticipated DC Plan:  HOME/SELF CARE      DC Planning Services  CM consult      Choice offered to / List presented to:             Status of service:  Completed, signed off Medicare Important Message given?   (If response is "NO", the following Medicare IM given date fields will be blank) Date Medicare IM given:   Date Additional Medicare IM given:    Discharge Disposition:  HOME/SELF CARE  Per UR Regulation:  Reviewed for med. necessity/level of care/duration of stay  If discussed at Long Length of Stay Meetings, dates discussed:    Comments:  11/04/12 Kiel Cockerell RN,BSN NCM [phone] S/P LAP ROB RAD PROSTATECTOMY.NO ANTICIPATED D/C NEEDS.

## 2012-11-04 NOTE — Progress Notes (Signed)
Patient ID: Arthur Klein, male   DOB: August 04, 1945, 67 y.o.   MRN: [id number] Post-op note  Subjective: The patient is doing well.  No complaints except bladder irritation/spasms.  He denies N/V.  Objective: Vital signs in last 24 hours: Temp:  [97.4 F (36.3 C)-98 F (36.7 C)] 97.7 F (36.5 C) (09/04 1120) Pulse Rate:  [85-104] 104 (09/04 1120) Resp:  [10-18] 14 (09/04 1120) BP: (127-161)/(73-88) 161/83 mmHg (09/04 1120) SpO2:  [97 %-100 %] 97 % (09/04 1120) Weight:  [89 kg (196 lb 3.4 oz)] 89 kg (196 lb 3.4 oz) (09/04 1120)  Intake/Output from previous day:   Intake/Output this shift: Total I/O In: 3027.5 [I.V.:3027.5] Out: 1010 [Urine:850; Drains:60; Blood:100]  Physical Exam:  General: Alert and oriented. Abdomen: Soft, Nondistended. Incisions: Clean and dry.  Lab Results:  Recent Labs  11/04/12 1037  HGB 13.7  HCT 41.3    Assessment/Plan: POD#0   1) Continue to monitor 2) Pt refused treatment for bladder spasms at this time.  He will let his RN know if he changes his mind 3) DVT prophy, amb, IS, pain control, clear liquids    LOS: 0 days   Silas Flood. 11/04/2012, 3:22 PM

## 2012-11-04 NOTE — Anesthesia Postprocedure Evaluation (Signed)
  Anesthesia Post-op Note  Patient: Arthur Klein  Procedure(s) Performed: Procedure(s) (LRB): ROBOTIC ASSISTED LAPAROSCOPIC RADICAL PROSTATECTOMY LEVEL 1 (N/A)  Patient Location: PACU  Anesthesia Type: General  Level of Consciousness: awake and alert   Airway and Oxygen Therapy: Patient Spontanous Breathing  Post-op Pain: mild  Post-op Assessment: Post-op Vital signs reviewed, Patient's Cardiovascular Status Stable, Respiratory Function Stable, Patent Airway and No signs of Nausea or vomiting  Last Vitals:  Filed Vitals:   11/04/12 1120  BP: 161/83  Pulse: 104  Temp: 36.5 C  Resp: 14    Post-op Vital Signs: stable   Complications: No apparent anesthesia complications

## 2012-11-05 ENCOUNTER — Encounter (HOSPITAL_COMMUNITY): Payer: Self-pay | Admitting: Urology

## 2012-11-05 LAB — GLUCOSE, CAPILLARY
Glucose-Capillary: 104 mg/dL — ABNORMAL HIGH (ref 70–99)
Glucose-Capillary: 120 mg/dL — ABNORMAL HIGH (ref 70–99)
Glucose-Capillary: 120 mg/dL — ABNORMAL HIGH (ref 70–99)

## 2012-11-05 LAB — HEMOGLOBIN AND HEMATOCRIT, BLOOD: HCT: 37.8 % — ABNORMAL LOW (ref 39.0–52.0)

## 2012-11-05 MED ORDER — HYDROCODONE-ACETAMINOPHEN 5-325 MG PO TABS: 1.0000 | ORAL_TABLET | Freq: Four times a day (QID) | ORAL | Status: DC | PRN

## 2012-11-05 MED ORDER — BISACODYL 10 MG RE SUPP
10.0000 mg | Freq: Once | RECTAL | Status: AC
  Administered 2012-11-05: 10 mg via RECTAL
  Filled 2012-11-05: qty 1

## 2012-11-05 NOTE — Progress Notes (Signed)
Mr Asare has been active with the Link to Wellness program on behalf of being a [employer] employee or dependent with [employer] insurance. He has been seeing the Link to [facility]. Made bedside visit to make Mr Malkiewicz aware that he will receive a post discharge follow up call. Left contact information at bedside. Appreciative of visit. Raiford Noble, MSN- Ed, Charity fundraiser, BSN- [facility] [phone]

## 2012-11-05 NOTE — Progress Notes (Signed)
Patient ID: Arthur Klein, male   DOB: 27-May-1945, 67 y.o.   MRN: [id number]  1 Day Post-Op Subjective: The patient is doing well.  No nausea or vomiting. Pain is adequately controlled. Still mild dizziness with ambulation but improved.  Objective: Vital signs in last 24 hours: Temp:  [97.4 F (36.3 C)-98.7 F (37.1 C)] 98.7 F (37.1 C) (09/05 0500) Pulse Rate:  [75-104] 75 (09/05 0500) Resp:  [10-16] 16 (09/05 0500) BP: (115-161)/(61-88) 116/61 mmHg (09/05 0500) SpO2:  [96 %-100 %] 96 % (09/05 0500) Weight:  [89 kg (196 lb 3.4 oz)] 89 kg (196 lb 3.4 oz) (09/04 1120)  Intake/Output from previous day: 09/04 0701 - 09/05 0700 In: 5527.5 [I.V.:5277.5; IV Piggyback:250] Out: 4420 [Urine:4150; Drains:170; Blood:100] Intake/Output this shift:    Physical Exam:  General: Alert and oriented. CV: RRR Lungs: Clear bilaterally. GI: Soft, Nondistended. Incisions: Dressings intact. Urine: Clear Extremities: Nontender, no erythema, no edema.  Lab Results:  Recent Labs  11/04/12 1037 11/05/12 0500  HGB 13.7 12.8*  HCT 41.3 37.8*      Assessment/Plan: POD# 1 s/p robotic prostatectomy.  1) SL IVF 2) Ambulate, Incentive spirometry 3) Transition to oral pain medication 4) Dulcolax suppository 5) D/C pelvic drain 6) Plan for likely discharge later today   Arthur Klein. MD   LOS: 1 day   Arthur Klein,LES 11/05/2012, 7:50 AM

## 2012-11-05 NOTE — Discharge Summary (Signed)
  Date of admission: 11/04/2012  Date of discharge: 11/05/2012  Admission diagnosis: Prostate Cancer  Discharge diagnosis: Prostate Cancer  History and Physical: For full details, please see admission history and physical. Briefly, Arthur Klein is a 67 y.o. gentleman with localized prostate cancer.  After discussing management/treatment options, he elected to proceed with surgical treatment.  Hospital Course: Arthur Klein was taken to the operating room on 11/04/2012 and underwent a robotic assisted laparoscopic radical prostatectomy. He tolerated this procedure well and without complications. Postoperatively, he was able to be transferred to a regular hospital room following recovery from anesthesia.  He was able to begin ambulating the night of surgery. He remained hemodynamically stable overnight.  He had excellent urine output with appropriately minimal output from his pelvic drain and his pelvic drain was removed on POD #1.  He was transitioned to oral pain medication, tolerated a clear liquid diet, and had met all discharge criteria and was able to be discharged home later on POD#1.  Laboratory values:  Recent Labs  11/04/12 1037 11/05/12 0500  HGB 13.7 12.8*  HCT 41.3 37.8*    Disposition: Home  Discharge instruction: He was instructed to be ambulatory but to refrain from heavy lifting, strenuous activity, or driving. He was instructed on urethral catheter care.  Discharge medications:     Medication List    STOP taking these medications       b complex vitamins capsule     MULTIPLE VITAMIN PO     PROBIOTIC-PREBIOTIC 1-250 BILLION-MG Caps     Vitamin D 2000 UNITS Caps      TAKE these medications       acetaminophen 500 MG tablet  Commonly known as:  TYLENOL  Take 500 mg by mouth every 6 (six) hours as needed for pain or fever.     ciprofloxacin 500 MG tablet  Commonly known as:  CIPRO  Take 1 tablet (500 mg total) by mouth 2 (two) times daily. Start day prior to  office visit for foley removal     cyclobenzaprine 10 MG tablet  Commonly known as:  FLEXERIL  Take 10 mg by mouth 3 (three) times daily as needed for muscle spasms.     HYDROcodone-acetaminophen 5-325 MG per tablet  Commonly known as:  NORCO  Take 1-2 tablets by mouth every 6 (six) hours as needed for pain.     losartan 50 MG tablet  Commonly known as:  COZAAR  Take 50 mg by mouth every morning.     metFORMIN 500 MG tablet  Commonly known as:  GLUCOPHAGE  Take 500 mg by mouth daily with breakfast.        Followup: He will followup in 1 week for catheter removal and to discuss his surgical pathology results.

## 2013-02-09 ENCOUNTER — Encounter: Payer: Self-pay | Admitting: *Deleted

## 2013-07-11 IMAGING — CR DG CHEST 2V
2 series · 2 of 2 positions shown · non-contrast
Comparison: None

CLINICAL DATA: Preop for total knee arthroplasty.

CHEST - 2 VIEW

[view not recorded (1 of 2)]
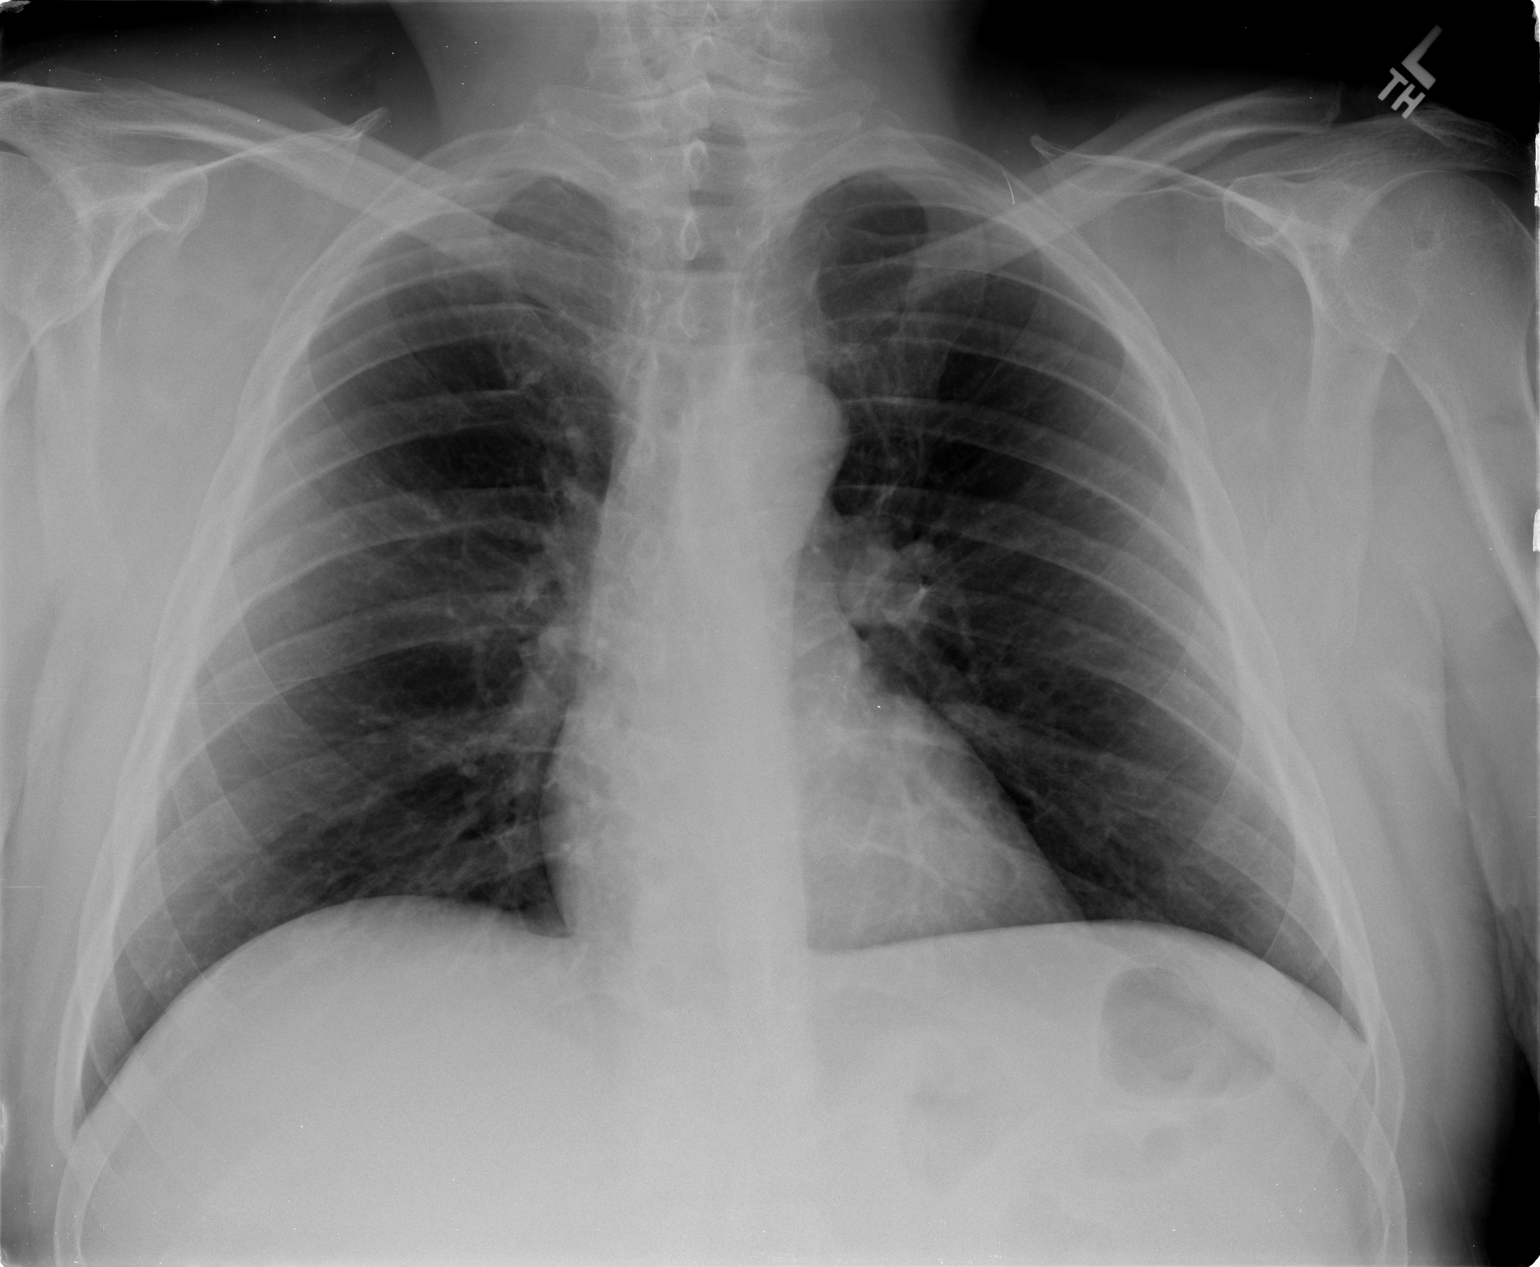

[view not recorded (2 of 2)]
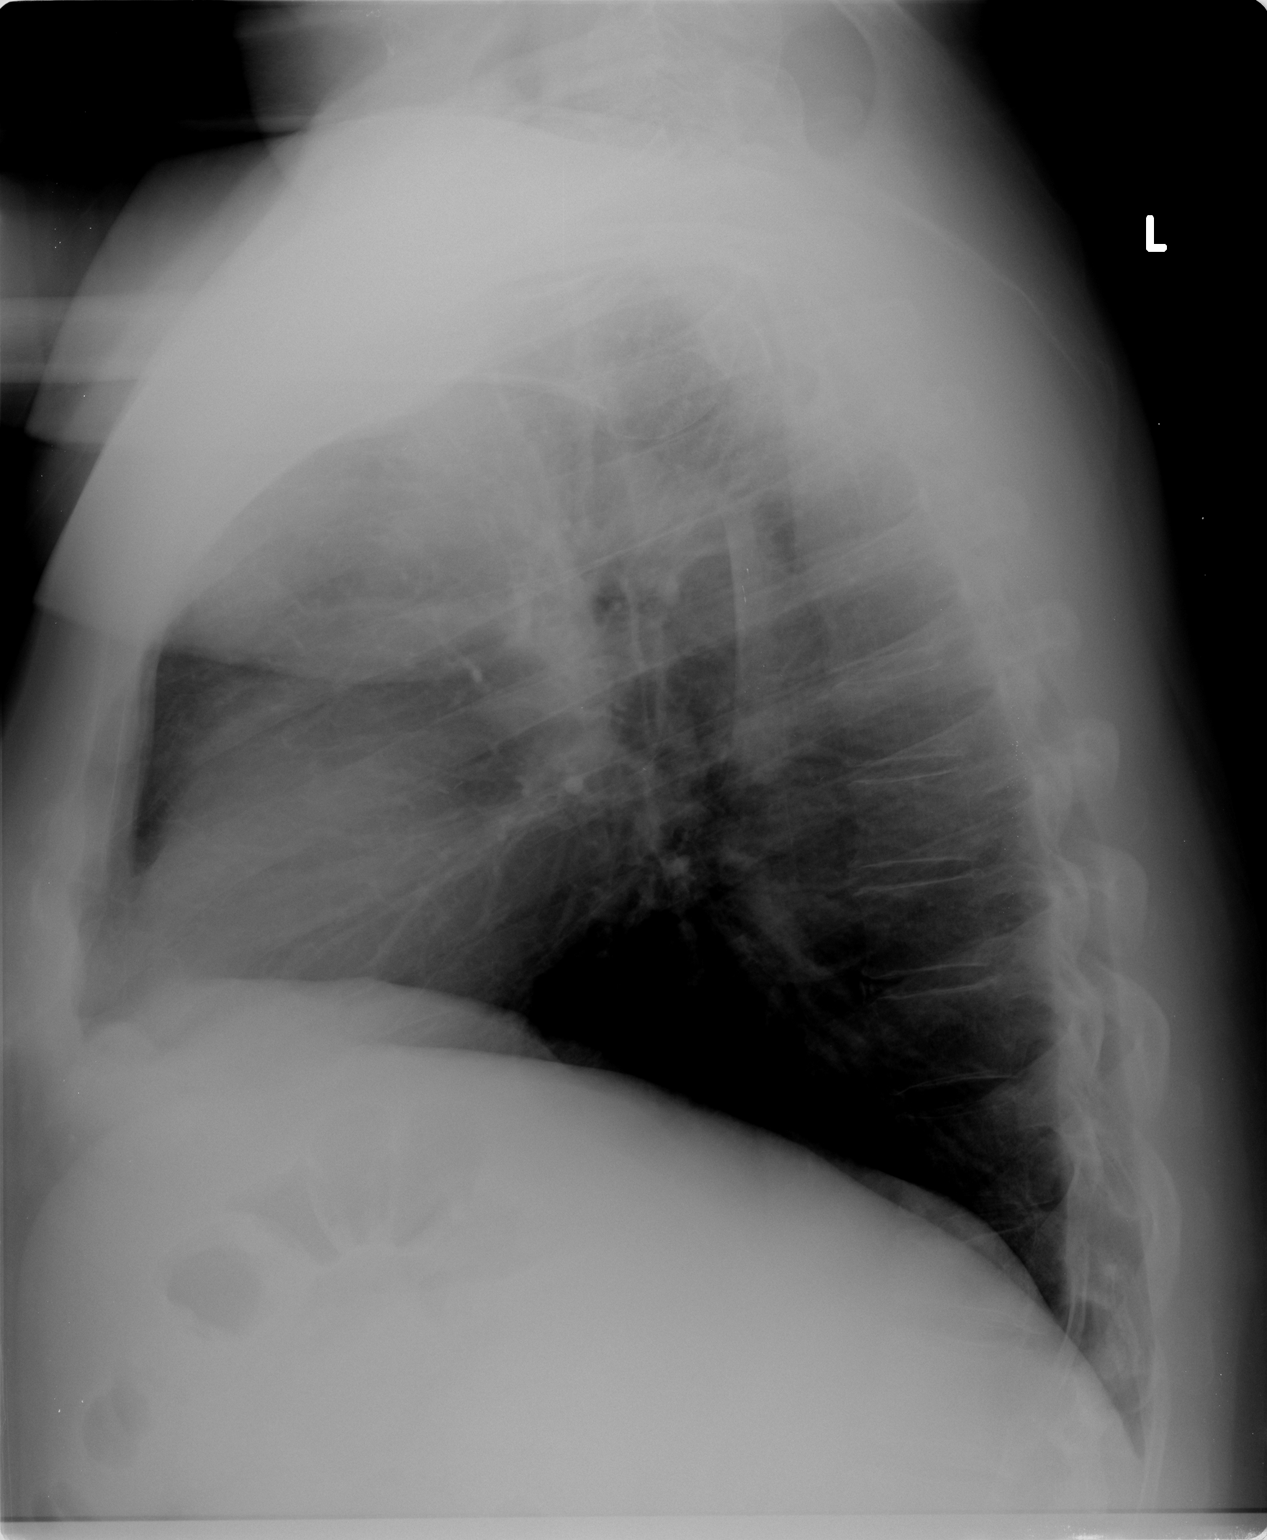

[2 of 2 positions shown; findings below may reference images not displayed]

FINDINGS: The cardiac silhouette, mediastinal and hilar contours
are within normal limits.  The lungs are clear.  No pleural
effusion.  The bony thorax is intact.  Remote surgical changes
involving the left shoulder.
IMPRESSION: Normal chest x-ray.

## 2014-02-15 ENCOUNTER — Other Ambulatory Visit (HOSPITAL_COMMUNITY): Payer: Self-pay | Admitting: Orthopedic Surgery

## 2014-02-15 DIAGNOSIS — M545 Low back pain: Secondary | ICD-10-CM

## 2014-03-01 ENCOUNTER — Ambulatory Visit (HOSPITAL_COMMUNITY)
Admission: RE | Admit: 2014-03-01 | Discharge: 2014-03-01 | Disposition: A | Discharge status: Home / Self Care | Payer: 59 | Source: Ambulatory Visit | Attending: Orthopedic Surgery | Admitting: Orthopedic Surgery

## 2014-03-01 DIAGNOSIS — M5136 Other intervertebral disc degeneration, lumbar region: Secondary | ICD-10-CM | POA: Diagnosis not present

## 2014-03-01 DIAGNOSIS — G8929 Other chronic pain: Secondary | ICD-10-CM | POA: Insufficient documentation

## 2014-03-01 DIAGNOSIS — M2548 Effusion, other site: Secondary | ICD-10-CM | POA: Insufficient documentation

## 2014-03-01 DIAGNOSIS — M4854XS Collapsed vertebra, not elsewhere classified, thoracic region, sequela of fracture: Secondary | ICD-10-CM | POA: Insufficient documentation

## 2014-03-01 DIAGNOSIS — M5126 Other intervertebral disc displacement, lumbar region: Secondary | ICD-10-CM | POA: Insufficient documentation

## 2014-03-01 DIAGNOSIS — M545 Low back pain: Secondary | ICD-10-CM | POA: Insufficient documentation

## 2014-03-01 DIAGNOSIS — R200 Anesthesia of skin: Secondary | ICD-10-CM | POA: Insufficient documentation

## 2014-03-01 DIAGNOSIS — M4806 Spinal stenosis, lumbar region: Secondary | ICD-10-CM | POA: Diagnosis not present

## 2014-08-08 ENCOUNTER — Ambulatory Visit: Payer: 59

## 2014-09-12 ENCOUNTER — Ambulatory Visit: Payer: Self-pay

## 2014-10-24 ENCOUNTER — Other Ambulatory Visit: Payer: Self-pay

## 2014-10-24 VITALS — BP 134/86 | HR 80 | Resp 16 | Ht 68.5 in | Wt 213.6 lb

## 2014-10-24 DIAGNOSIS — E119 Type 2 diabetes mellitus without complications: Secondary | ICD-10-CM

## 2014-10-24 DIAGNOSIS — E118 Type 2 diabetes mellitus with unspecified complications: Secondary | ICD-10-CM | POA: Insufficient documentation

## 2014-10-24 NOTE — Patient Outreach (Signed)
[facility]) Care Management   10/24/2014  Arthur Klein 04/08/1945 [id number]  Arthur Klein is an 69 y.o. male.   Member seen for follow up office visit for Link to Wellness program for self management of Type 2 diabetes  Subjective: Member states that he saw his doctor in June and his lab work was good.  Hemoglobin A1c was 5.9.  States he continues to be off of his Metformin and his blood sugars have been good. States his morning reads can be up a little after he has had ice cream for a bedtime snack.  States he had been eating a bowl but now he is using a cone so he does not eat as much.  He reports one episode of hypoglycemia with CBG of 70 when he was at the beach.  States he had not eaten and was walking more.   States he is very active working around his home and remodeling old cars.    Objective:   Review of Systems  All other systems reviewed and are negative.   Physical Exam  Today's Vitals   10/24/14 0849  BP: 134/86  Pulse: 80  Resp: 16  Height: 1.74 m (5' 8.5")  Weight: 213 lb 9.6 oz (96.888 kg)  SpO2: 96%  PainSc: 0-No pain    Current Medications:   Current Outpatient Prescriptions  Medication Sig Dispense Refill  . acetaminophen (TYLENOL) 500 MG tablet Take 500 mg by mouth every 6 (six) hours as needed for pain or fever.     . B Complex Vitamins (VITAMIN B COMPLEX) TABS Take 1 tablet by mouth daily.    . Cholecalciferol (VITAMIN D-3) 1000 UNITS CAPS Take 1 capsule by mouth daily.    . Multiple Vitamins-Minerals (MULTIVITAMIN & MINERAL PO) Take 1 tablet by mouth daily.    Marland Kitchen omega-3 fish oil (MAXEPA) 1000 MG CAPS capsule Take 2 capsules by mouth daily.    . tadalafil (CIALIS) 5 MG tablet Take 5 mg by mouth daily as needed for erectile dysfunction.    . ciprofloxacin (CIPRO) 500 MG tablet Take 1 tablet (500 mg total) by mouth 2 (two) times daily. Start day prior to office visit for foley removal (Patient not taking: Reported on 10/24/2014) 6 tablet 0   . cyclobenzaprine (FLEXERIL) 10 MG tablet Take 10 mg by mouth 3 (three) times daily as needed for muscle spasms.     Marland Kitchen HYDROcodone-acetaminophen (NORCO) 5-325 MG per tablet Take 1-2 tablets by mouth every 6 (six) hours as needed for pain. (Patient not taking: Reported on 10/24/2014) 30 tablet 0  . losartan (COZAAR) 50 MG tablet Take 50 mg by mouth every morning.     . metFORMIN (GLUCOPHAGE) 500 MG tablet Take 500 mg by mouth daily with breakfast.      No current facility-administered medications for this visit.    Functional Status:   In your present state of health, do you have any difficulty performing the following activities: 10/24/2014  Hearing? N  Vision? N  Difficulty concentrating or making decisions? N  Walking or climbing stairs? N  Dressing or bathing? N  Doing errands, shopping? N  Preparing Food and eating ? N  Using the Toilet? N  In the past six months, have you accidently leaked urine? N  Do you have problems with loss of bowel control? N  Managing your Medications? N  Managing your Finances? N  Housekeeping or managing your Housekeeping? N    Fall/Depression Screening:    [facility] 2/9  Scores 10/24/2014  PHQ - 2 Score 0   [facility] CM Care Plan Problem One        Patient Outreach from 10/24/2014 in [place] Problem One  Potential for elevated blood sugars related to dx of diabetes   Care Plan for Problem One  Active   [facility]N Long Term Goal (31-90 days)  Member will maintain hemoglobin A1C below 6.5 for the next 180 days [Member well controled with Hemoglobin A1C of 5.9]   THN Long Term Goal Start Date  10/24/14   Interventions for Problem One Long Term Goal  Reviewed CHO counting and portion conrtol, Reviewed portion size of ice cream and instructed to try to limit portion to 1/2 cup, Instructed on s/s of hypoglycemia and actions to take, given handout on hypoglycemia, Encouraged to continue to be active and exercise daily, Instructed to keep  appointments with provider, Instructed on s/s of stroke and actions to take       Assessment:   Member seen for follow up office visit for Link to Wellness program for self management of Type 2 diabetes.  Member well controlled with last hemoglobin A1C 5.9 with no medications for diabetes. Reports fasting CBGs range 95-116.  Member is up to date with eye exam and dental cleanings.  Member does not exercise other than he is very active working around his home and work shop.    Plan:  Plan to continue to check blood sugar daily fasting and check 1  2 hours after eating  Goals of less than 100 fasting and less than 140 two hours after eating Plan to watch portions on ice cream snacks Plan to remain active with yard work  Plan to return to [employer] to Wellness in 6 months  on 04/23/14 since member is well controlled  [name] RN, [facility] Care Management Coordinator-Link to [place] [facility] [phone]

## 2014-10-24 NOTE — Patient Instructions (Signed)
1. Plan to continue to check blood sugar daily fasting and check 1  2 hours after eating  Goals of less than 100 fasting and less than 140 two hours after eating 2. Plan to watch portions on ice cream snacks 3. Plan to remain active with yard work  4. Plan to return to Link to Wellness on 04/23/14  at [facility]

## 2014-12-11 ENCOUNTER — Encounter (INDEPENDENT_AMBULATORY_CARE_PROVIDER_SITE_OTHER): Payer: 59

## 2014-12-11 DIAGNOSIS — I493 Ventricular premature depolarization: Secondary | ICD-10-CM | POA: Diagnosis not present

## 2015-02-13 ENCOUNTER — Other Ambulatory Visit: Payer: Self-pay | Admitting: Surgery

## 2015-02-13 NOTE — H&P (Signed)
Arthur Klein 02/13/2015 8:37 AM Location: [facility] Patient #: [id number] DOB: [birth year] Married / Language: English / Race: White Male  History of Present Illness Adin Hector MD; 02/13/2015 9:27 AM) The patient is a 69 year old male who presents with an inguinal hernia. Pleasant chatty male who sent to me by primary care physician Dr. Ardeth Perfect for concern of a RIGHT inguinal hernia.  Pleasant active male. History of LEFT no hernia repaired in an open fashion in the 19 nineties. Had a robotic prostatectomy for prostate cancer by Dr. Dutch Gray in 2014. I believe no radiation therapy needed afterwards. Noted a little bit of swelling in his RIGHT groin about a year ago. Concern for perhaps small inguinal hernia. Dr. Alinda Money agreed. He calm larger and more painful. Because it is worsened, he wished to consider surgery. He about doing in [place] but reconsider to be in [place]. Wife works at [employer]. Patient sent to our group. History of numerous back and major joint issues with numerous prior surgeries. Has a lot of chronic pain. Tries to avoid narcotics. Spirits with oxycodone after one surgery. He has a bowel movement every day. 15 minutes and up a flight of stairs. Often joint and back pain makes him stop. No definite cardiac issues in the past. Does not like to take medications. Tends to avoid them. Tries to use Tylenol only. A colonoscopy. Recalls his wife hearing an irregular heart rate. Recalls getting a Holter monitor that was completely normal.   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 Open Inguinal Hernia Surgery Left. Oral Surgery Prostate Surgery - Removal Shoulder Surgery Bilateral.  Diagnostic Studies History Elbert Ewings, CMA; 02/13/2015 8:37 AM) Colonoscopy never  Allergies Adin Hector, MD; 02/13/2015 9:30 AM) Erythrocin Stearate *MACROLIDES* Lisinopril *CHEMICALS* OxyCODONE HCl *ANALGESICS - OPIOID* confusion & memory loss  Medication History Elbert Ewings, CMA; 02/13/2015 8:38 AM) No Current Medications Medications Reconciled  Social History Elbert Ewings, CMA; 02/13/2015 8:37 AM) Alcohol use Remotely quit alcohol use. No caffeine use Tobacco use Never smoker.  Family History Elbert Ewings, [place]; 02/13/2015 8:37 AM) Cerebrovascular Accident Brother, Father. Colon Polyps Mother. Diabetes Mellitus Brother, Mother. Heart Disease Brother, Mother. Hypertension Mother. Malignant Neoplasm Of Pancreas Brother. Melanoma Family Members In General. Prostate Cancer Brother, Father.     Review of Systems Elbert Ewings CMA; 02/13/2015 8:37 AM) General Not Present- Appetite Loss, Chills, Fatigue, Fever, Night Sweats, Weight Gain and Weight Loss. Skin Not Present- Change in Wart/Mole, Dryness, Hives, Jaundice, New Lesions, Non-Healing Wounds, Rash and Ulcer. HEENT Present- Wears glasses/contact lenses. Not Present- Earache, Hearing Loss, Hoarseness, Nose Bleed, Oral Ulcers, Ringing in the Ears, Seasonal Allergies, Sinus Pain, Sore Throat, Visual Disturbances and Yellow Eyes. Respiratory Present- Snoring. Not Present- Bloody sputum, Chronic Cough, Difficulty Breathing and Wheezing. Breast Not Present- Breast Mass, Breast Pain, Nipple Discharge and Skin Changes. Cardiovascular Present- Palpitations. Not Present- Chest Pain, Difficulty Breathing Lying Down, Leg Cramps, Rapid Heart Rate, Shortness of Breath and Swelling of Extremities. Gastrointestinal Not Present- Abdominal Pain, Bloating, Bloody Stool, Change in Bowel Habits, Chronic  diarrhea, Constipation, Difficulty Swallowing, Excessive gas, Gets full quickly at meals, Hemorrhoids, Indigestion, Nausea, Rectal Pain and Vomiting. Male Genitourinary Present- Impotence. Not  Present- Blood in Urine, Change in Urinary Stream, Frequency, Nocturia, Painful Urination, Urgency and Urine Leakage. Musculoskeletal Present- Back Pain and Joint Pain. Not Present- Joint Stiffness, Muscle Pain, Muscle Weakness and Swelling of Extremities. Neurological Not Present- Decreased Memory, Fainting, Headaches, Numbness, Seizures, Tingling, Tremor, Trouble walking and Weakness. Psychiatric Not Present- Anxiety, Bipolar, Change in Sleep Pattern, Depression, Fearful and Frequent crying. Endocrine Not Present- Cold Intolerance, Excessive Hunger, Hair Changes, Heat Intolerance, Hot flashes and New Diabetes. Hematology Not Present- Easy Bruising, Excessive bleeding, Gland problems, HIV and Persistent Infections.  Vitals Elbert Ewings CMA; 02/13/2015 8:39 AM) 02/13/2015 8:38 AM Weight: 215 lb Height: 69in Body Surface Area: 2.13 m Body Mass Index: 31.75 kg/m  Temp.: 98.64F(Temporal)  Pulse: 96 (Regular)  BP: 130/70 (Sitting, Left Arm, Standard)      Physical Exam Adin Hector MD; 02/13/2015 9:28 AM)  General Mental Status-Alert. General Appearance-Not in acute distress, Not Sickly. Orientation-Oriented X3. Hydration-Well hydrated. Voice-Normal.  Integumentary Global Assessment Upon inspection and palpation of skin surfaces of the - Axillae: non-tender, no inflammation or ulceration, no drainage. and Distribution of scalp and body hair is normal. General Characteristics Temperature - normal warmth is noted.  Head and Neck Head-normocephalic, atraumatic with no lesions or palpable masses. Face Global Assessment - atraumatic, no absence of expression. Neck Global Assessment - no abnormal movements, no bruit auscultated on the right, no bruit auscultated on  the left, no decreased range of motion, non-tender. Trachea-midline. Thyroid Gland Characteristics - non-tender.  Eye Eyeball - Left-Extraocular movements intact, No Nystagmus. Eyeball - Right-Extraocular movements intact, No Nystagmus. Cornea - Left-No Hazy. Cornea - Right-No Hazy. Sclera/Conjunctiva - Left-No scleral icterus, No Discharge. Sclera/Conjunctiva - Right-No scleral icterus, No Discharge. Pupil - Left-Direct reaction to light normal. Pupil - Right-Direct reaction to light normal.  ENMT Ears Pinna - Left - no drainage observed, no generalized tenderness observed. Right - no drainage observed, no generalized tenderness observed. Nose and Sinuses External Inspection of the Nose - no destructive lesion observed. Inspection of the nares - Left - quiet respiration. Right - quiet respiration. Mouth and Throat Lips - Upper Lip - no fissures observed, no pallor noted. Lower Lip - no fissures observed, no pallor noted. Nasopharynx - no discharge present. Oral Cavity/Oropharynx - Tongue - no dryness observed. Oral Mucosa - no cyanosis observed. Hypopharynx - no evidence of airway distress observed.  Chest and Lung Exam Inspection Movements - Normal and Symmetrical. Accessory muscles - No use of accessory muscles in breathing. Palpation Palpation of the chest reveals - Non-tender. Auscultation Breath sounds - Normal and Clear.  Cardiovascular Auscultation Rhythm - Regular. Murmurs & Other Heart Sounds - Auscultation of the heart reveals - No Murmurs and No Systolic Clicks.  Abdomen Inspection Inspection of the abdomen reveals - No Visible peristalsis and No Abnormal pulsations. Umbilicus - No Bleeding, No Urine drainage. Palpation/Percussion Palpation and Percussion of the abdomen reveal - Soft, Non Tender, No Rebound tenderness, No Rigidity (guarding) and No Cutaneous hyperesthesia. Note: Obese but soft. No umbilical hernia. No abdominal pain. No  guarding.  Male Genitourinary Sexual Maturity Tanner 5 - Adult hair pattern and Adult penile size and shape. 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 He has sensitive but reducible. A large testes but no discrete mass. Scar in LEFT groin. No evidence of LEFT inguinal hernia. Large mons pubis. Hygiene good.  Peripheral Vascular Upper Extremity Inspection - Left - No Cyanotic nailbeds, Not Ischemic. Right -  No Cyanotic nailbeds, Not Ischemic.  Neurologic Neurologic evaluation reveals -normal attention span and ability to concentrate, able to name objects and repeat phrases. Appropriate fund of knowledge , normal sensation and normal coordination. Mental Status Affect - not angry, not paranoid. Cranial Nerves-Normal Bilaterally. Gait-Normal.  Neuropsychiatric Mental status exam performed with findings of-able to articulate well with normal speech/language, rate, volume and coherence, thought content normal with ability to perform basic computations and apply abstract reasoning and no evidence of hallucinations, delusions, obsessions or homicidal/suicidal ideation.  Musculoskeletal Global Assessment Spine, Ribs and Pelvis - no instability, subluxation or laxity. Right Upper Extremity - no instability, subluxation or laxity.  Lymphatic Head & Neck  General Head & Neck Lymphatics: Bilateral - Description - No Localized lymphadenopathy. Axillary  General Axillary Region: Bilateral - Description - No Localized lymphadenopathy. Femoral & Inguinal  Generalized Femoral & Inguinal Lymphatics: Left - Description - No Localized lymphadenopathy. Right - Description - No Localized lymphadenopathy.    Assessment & Plan Adin Hector MD; 02/13/2015 9:30 AM)  RIGHT INGUINAL HERNIA (K40.90) Impression: Obvious RIGHT inguinal hernia with increasing size and symptoms. I think he would benefit from repair. Reasonable start out  laparoscopically but increased conversion rate to open given history of robotic prostatectomy. Doubt any left-sided problems since nothing on exam nor symptoms. Patient would prefer outpatient surgery at [facility] long on a Monday to be convenient for his wife to help him recover. Because he's had this for at least a year and has not any major warning sign, he is willing to wait a month or so until we can set it up that way. That is reasonable.  He is hoping to avoid narcotics after surgery. I cautioned that was not realistic. I agree and maximizing nonnarcotic pain control with using Ice / Heat and over-the-counter nonsteroidals first makes sense. We will try and avoid oxycodone since that seem to bother him. All I can do is advise though. He understands.  Consider exercising and swimming pool with swimming to stay active.Marland Kitchen He noted that helped after his knee surgery. He will reconsider it.  Consider getting colonoscopy as it is the #2 cancer.  Current Plans You are being scheduled for surgery - Our schedulers will call you.  You should hear from our office's scheduling department within 5 working days about the location, date, and time of surgery. We try to make accommodations for patient's preferences in scheduling surgery, but sometimes the OR schedule or the surgeon's schedule prevents [place] from making those accommodations.  If you have not heard from our office [phone]) in 5 working days, call the office and ask for your surgeon's nurse.  If you have other questions about your diagnosis, plan, or surgery, call the office and ask for your surgeon's nurse.  The anatomy & physiology of the abdominal wall and pelvic floor was discussed. The pathophysiology of hernias in the inguinal and pelvic region was discussed. Natural history risks such as progressive enlargement, pain, incarceration, and strangulation was discussed. Contributors to complications such as smoking, obesity, diabetes, prior  surgery, etc were discussed.  I feel the risks of no intervention will lead to serious problems that outweigh the operative risks; therefore, I recommended surgery to reduce and repair the hernia. I explained laparoscopic techniques with possible need for an open approach. I noted usual use of mesh to patch and/or buttress hernia repair  Risks such as bleeding, infection, abscess, need for further treatment, heart attack, death, and other risks were discussed. I noted  a good likelihood this will help address the problem. Goals of post-operative recovery were discussed as well. Possibility that this will not correct all symptoms was explained. I stressed the importance of low-impact activity, aggressive pain control, avoiding constipation, & not pushing through pain to minimize risk of post-operative chronic pain or injury. Possibility of reherniation was discussed. We will work to minimize complications.  An educational handout further explaining the pathology & treatment options was given as well. Questions were answered. The patient expresses understanding & wishes to proceed with surgery.  Pt Education - Pamphlet Given - Hernia Surgery: discussed with patient and provided information. Written instructions provided Pt Education - [facility] ([facility] [place])  Adin Hector, M.D., F.A.C.S. Gastrointestinal and Minimally Invasive Surgery [facility] [address], [place], [place] [address] [phone] Main / Paging

## 2015-03-12 ENCOUNTER — Encounter (HOSPITAL_COMMUNITY): Payer: 59

## 2015-03-12 NOTE — Patient Instructions (Signed)
Arthur Klein  03/12/2015   Your procedure is scheduled on: 03/16/2015    Report to [facility] Main  Entrance take [place]  elevators to 3rd floor to  [place] at    1015 AM.  Call this number if you have problems the morning of surgery [phone]   Remember: ONLY 1 PERSON MAY GO WITH YOU TO SHORT STAY TO GET  READY MORNING OF [place].  Do not eat food or drink liquids :After Midnight.     Take these medicines the morning of surgery with A SIP OF WATER: none                                 You may not have any metal on your body including hair pins and              piercings  Do not wear jewelry, , lotions, powders or perfumes, deodorant                       Men may shave face and neck.   Do not bring valuables to the hospital. [place].  Contacts, dentures or bridgework may not be worn into surgery.      Patients discharged the day of surgery will not be allowed to drive home.  Name and phone number of your driver:  Special Instructions: coughing and deep breathing exercises, leg exercises               Please read over the following fact sheets you were given: _____________________________________________________________________             [facility] - Preparing for Surgery Before surgery, you can play an important role.  Because skin is not sterile, your skin needs to be as free of germs as possible.  You can reduce the number of germs on your skin by washing with CHG (chlorahexidine gluconate) soap before surgery.  CHG is an antiseptic cleaner which kills germs and bonds with the skin to continue killing germs even after washing. Please DO NOT use if you have an allergy to CHG or antibacterial soaps.  If your skin becomes reddened/irritated stop using the CHG and inform your nurse when you arrive at Short Stay. Do not shave (including legs and underarms) for at least 48 hours prior to  the first CHG shower.  You may shave your face/neck. Please follow these instructions carefully:  1.  Shower with CHG Soap the night before surgery and the  morning of Surgery.  2.  If you choose to wash your hair, wash your hair first as usual with your  normal  shampoo.  3.  After you shampoo, rinse your hair and body thoroughly to remove the  shampoo.                           4.  Use CHG as you would any other liquid soap.  You can apply chg directly  to the skin and wash                       Gently with a scrungie or clean washcloth.  5.  Apply the CHG Soap to your body ONLY FROM THE NECK DOWN.   Do not use on face/ open                           Wound or open sores. Avoid contact with eyes, ears mouth and genitals (private parts).                       Wash face,  Genitals (private parts) with your normal soap.             6.  Wash thoroughly, paying special attention to the area where your surgery  will be performed.  7.  Thoroughly rinse your body with warm water from the neck down.  8.  DO NOT shower/wash with your normal soap after using and rinsing off  the CHG Soap.                9.  Pat yourself dry with a clean towel.            10.  Wear clean pajamas.            11.  Place clean sheets on your bed the night of your first shower and do not  sleep with pets. Day of Surgery : Do not apply any lotions/deodorants the morning of surgery.  Please wear clean clothes to the hospital/surgery center.  FAILURE TO FOLLOW THESE INSTRUCTIONS MAY RESULT IN THE CANCELLATION OF YOUR SURGERY PATIENT SIGNATURE_________________________________  NURSE SIGNATURE__________________________________  ________________________________________________________________________

## 2015-03-13 ENCOUNTER — Encounter (HOSPITAL_COMMUNITY): Payer: Self-pay

## 2015-03-13 ENCOUNTER — Encounter (HOSPITAL_COMMUNITY)
Admission: RE | Admit: 2015-03-13 | Discharge: 2015-03-13 | Disposition: A | Discharge status: Home / Self Care | Payer: 59 | Source: Ambulatory Visit | Attending: Surgery | Admitting: Surgery

## 2015-03-13 DIAGNOSIS — K219 Gastro-esophageal reflux disease without esophagitis: Secondary | ICD-10-CM | POA: Diagnosis not present

## 2015-03-13 DIAGNOSIS — Z8546 Personal history of malignant neoplasm of prostate: Secondary | ICD-10-CM | POA: Diagnosis not present

## 2015-03-13 DIAGNOSIS — K4090 Unilateral inguinal hernia, without obstruction or gangrene, not specified as recurrent: Secondary | ICD-10-CM | POA: Diagnosis present

## 2015-03-13 DIAGNOSIS — M1990 Unspecified osteoarthritis, unspecified site: Secondary | ICD-10-CM | POA: Diagnosis not present

## 2015-03-13 DIAGNOSIS — K4120 Bilateral femoral hernia, without obstruction or gangrene, not specified as recurrent: Secondary | ICD-10-CM | POA: Diagnosis not present

## 2015-03-13 DIAGNOSIS — E119 Type 2 diabetes mellitus without complications: Secondary | ICD-10-CM | POA: Diagnosis not present

## 2015-03-13 DIAGNOSIS — D176 Benign lipomatous neoplasm of spermatic cord: Secondary | ICD-10-CM | POA: Diagnosis not present

## 2015-03-13 DIAGNOSIS — I10 Essential (primary) hypertension: Secondary | ICD-10-CM | POA: Diagnosis not present

## 2015-03-13 DIAGNOSIS — Z9079 Acquired absence of other genital organ(s): Secondary | ICD-10-CM | POA: Diagnosis not present

## 2015-03-13 DIAGNOSIS — K4020 Bilateral inguinal hernia, without obstruction or gangrene, not specified as recurrent: Secondary | ICD-10-CM | POA: Diagnosis not present

## 2015-03-13 DIAGNOSIS — N400 Enlarged prostate without lower urinary tract symptoms: Secondary | ICD-10-CM | POA: Diagnosis not present

## 2015-03-13 HISTORY — DX: Other specified postprocedural states: Z98.890

## 2015-03-13 HISTORY — DX: Ventricular premature depolarization: I49.3

## 2015-03-13 HISTORY — DX: Nausea with vomiting, unspecified: R11.2

## 2015-03-13 LAB — BASIC METABOLIC PANEL
ANION GAP: 9 (ref 5–15)
BUN: 17 mg/dL (ref 6–20)
CALCIUM: 9.7 mg/dL (ref 8.9–10.3)
CO2: 26 mmol/L (ref 22–32)
CREATININE: 0.94 mg/dL (ref 0.61–1.24)
Chloride: 107 mmol/L (ref 101–111)
GFR calc Af Amer: >60 mL/min (ref >60)
GLUCOSE: 113 mg/dL — AB (ref 65–99)
Potassium: 4.5 mmol/L (ref 3.5–5.1)
Sodium: 142 mmol/L (ref 135–145)

## 2015-03-13 LAB — CBC
HCT: 48.2 % (ref 39.0–52.0)
HEMOGLOBIN: 15.7 g/dL (ref 13.0–17.0)
MCH: 29.5 pg (ref 26.0–34.0)
MCHC: 32.6 g/dL (ref 30.0–36.0)
MCV: 90.4 fL (ref 78.0–100.0)
PLATELETS: 147 10*3/uL — AB (ref 150–400)
RBC: 5.33 MIL/uL (ref 4.22–5.81)
RDW: 13.6 % (ref 11.5–15.5)
WBC: 7.5 10*3/uL (ref 4.0–10.5)

## 2015-03-16 ENCOUNTER — Ambulatory Visit (HOSPITAL_COMMUNITY): Payer: 59 | Admitting: Certified Registered Nurse Anesthetist

## 2015-03-16 ENCOUNTER — Encounter (HOSPITAL_COMMUNITY): Payer: Self-pay | Admitting: *Deleted

## 2015-03-16 ENCOUNTER — Encounter (HOSPITAL_COMMUNITY)
Admission: RE | Disposition: A | Discharge status: Home / Self Care | Payer: Self-pay | Source: Ambulatory Visit | Attending: Surgery

## 2015-03-16 ENCOUNTER — Ambulatory Visit (HOSPITAL_COMMUNITY)
Admission: RE | Admit: 2015-03-16 | Discharge: 2015-03-16 | Disposition: A | Discharge status: Home / Self Care | Payer: 59 | Source: Ambulatory Visit | Attending: Surgery | Admitting: Surgery

## 2015-03-16 DIAGNOSIS — K219 Gastro-esophageal reflux disease without esophagitis: Secondary | ICD-10-CM | POA: Insufficient documentation

## 2015-03-16 DIAGNOSIS — Z9079 Acquired absence of other genital organ(s): Secondary | ICD-10-CM | POA: Diagnosis not present

## 2015-03-16 DIAGNOSIS — E119 Type 2 diabetes mellitus without complications: Secondary | ICD-10-CM | POA: Insufficient documentation

## 2015-03-16 DIAGNOSIS — K4090 Unilateral inguinal hernia, without obstruction or gangrene, not specified as recurrent: Secondary | ICD-10-CM | POA: Diagnosis not present

## 2015-03-16 DIAGNOSIS — Z8546 Personal history of malignant neoplasm of prostate: Secondary | ICD-10-CM | POA: Insufficient documentation

## 2015-03-16 DIAGNOSIS — K4020 Bilateral inguinal hernia, without obstruction or gangrene, not specified as recurrent: Secondary | ICD-10-CM | POA: Insufficient documentation

## 2015-03-16 DIAGNOSIS — M1990 Unspecified osteoarthritis, unspecified site: Secondary | ICD-10-CM | POA: Diagnosis not present

## 2015-03-16 DIAGNOSIS — K4120 Bilateral femoral hernia, without obstruction or gangrene, not specified as recurrent: Secondary | ICD-10-CM | POA: Insufficient documentation

## 2015-03-16 DIAGNOSIS — N400 Enlarged prostate without lower urinary tract symptoms: Secondary | ICD-10-CM | POA: Insufficient documentation

## 2015-03-16 DIAGNOSIS — D176 Benign lipomatous neoplasm of spermatic cord: Secondary | ICD-10-CM | POA: Diagnosis not present

## 2015-03-16 DIAGNOSIS — I10 Essential (primary) hypertension: Secondary | ICD-10-CM | POA: Diagnosis not present

## 2015-03-16 HISTORY — PX: LAPAROSCOPIC LYSIS OF ADHESIONS: SHX5905

## 2015-03-16 HISTORY — PX: INGUINAL HERNIA REPAIR: SHX194

## 2015-03-16 HISTORY — PX: INSERTION OF MESH: SHX5868

## 2015-03-16 SURGERY — REPAIR, HERNIA, INGUINAL, LAPAROSCOPIC
Anesthesia: General | Site: Groin | Laterality: Right

## 2015-03-16 MED ORDER — ACETAMINOPHEN 10 MG/ML IV SOLN
INTRAVENOUS | Status: DC | PRN
  Administered 2015-03-16: 1000 mg via INTRAVENOUS

## 2015-03-16 MED ORDER — ROCURONIUM BROMIDE 100 MG/10ML IV SOLN
INTRAVENOUS | Status: AC
  Filled 2015-03-16: qty 1

## 2015-03-16 MED ORDER — HYDROMORPHONE HCL 1 MG/ML IJ SOLN
INTRAMUSCULAR | Status: DC | PRN
  Administered 2015-03-16: 1 mg via INTRAVENOUS
  Administered 2015-03-16 (×2): 0.5 mg via INTRAVENOUS

## 2015-03-16 MED ORDER — KETOROLAC TROMETHAMINE 30 MG/ML IJ SOLN
30.0000 mg | Freq: Once | INTRAMUSCULAR | Status: DC
  Filled 2015-03-16: qty 1

## 2015-03-16 MED ORDER — ACETAMINOPHEN 10 MG/ML IV SOLN
INTRAVENOUS | Status: AC
  Filled 2015-03-16: qty 100

## 2015-03-16 MED ORDER — BUPIVACAINE-EPINEPHRINE 0.25% -1:200000 IJ SOLN
INTRAMUSCULAR | Status: AC
  Filled 2015-03-16: qty 1

## 2015-03-16 MED ORDER — HYDROMORPHONE HCL 1 MG/ML IJ SOLN
0.2500 mg | INTRAMUSCULAR | Status: DC | PRN
Start: 2015-03-16 — End: 2015-03-16
  Administered 2015-03-16: 0.25 mg via INTRAVENOUS

## 2015-03-16 MED ORDER — EPHEDRINE SULFATE 50 MG/ML IJ SOLN
INTRAMUSCULAR | Status: AC
  Filled 2015-03-16: qty 1

## 2015-03-16 MED ORDER — SODIUM CHLORIDE 0.9 % IJ SOLN
INTRAMUSCULAR | Status: AC
  Filled 2015-03-16: qty 10

## 2015-03-16 MED ORDER — PROPOFOL 10 MG/ML IV BOLUS
INTRAVENOUS | Status: AC
  Filled 2015-03-16: qty 20

## 2015-03-16 MED ORDER — HYDROCODONE-ACETAMINOPHEN 5-325 MG PO TABS: 1.0000 | ORAL_TABLET | ORAL | Status: DC | PRN

## 2015-03-16 MED ORDER — LACTATED RINGERS IV SOLN
INTRAVENOUS | Status: DC
  Administered 2015-03-16: 14:00:00 via INTRAVENOUS
  Administered 2015-03-16: 1000 mL via INTRAVENOUS

## 2015-03-16 MED ORDER — 0.9 % SODIUM CHLORIDE (POUR BTL) OPTIME
TOPICAL | Status: DC | PRN
  Administered 2015-03-16: 1000 mL

## 2015-03-16 MED ORDER — GLYCOPYRROLATE 0.2 MG/ML IJ SOLN
INTRAMUSCULAR | Status: DC | PRN
  Administered 2015-03-16: 0.2 mg via INTRAVENOUS

## 2015-03-16 MED ORDER — SUCCINYLCHOLINE CHLORIDE 20 MG/ML IJ SOLN
INTRAMUSCULAR | Status: DC | PRN
  Administered 2015-03-16: 100 mg via INTRAVENOUS

## 2015-03-16 MED ORDER — FENTANYL CITRATE (PF) 100 MCG/2ML IJ SOLN
INTRAMUSCULAR | Status: DC | PRN
  Administered 2015-03-16 (×5): 50 ug via INTRAVENOUS

## 2015-03-16 MED ORDER — ONDANSETRON HCL 4 MG/2ML IJ SOLN
INTRAMUSCULAR | Status: DC | PRN
  Administered 2015-03-16: 4 mg via INTRAVENOUS

## 2015-03-16 MED ORDER — CHLORHEXIDINE GLUCONATE 4 % EX LIQD: 1.0000 "application " | Freq: Once | CUTANEOUS | Status: DC

## 2015-03-16 MED ORDER — HYDROCODONE-ACETAMINOPHEN 7.5-325 MG PO TABS: 1.0000 | ORAL_TABLET | Freq: Once | ORAL | Status: DC | PRN

## 2015-03-16 MED ORDER — FENTANYL CITRATE (PF) 250 MCG/5ML IJ SOLN
INTRAMUSCULAR | Status: AC
  Filled 2015-03-16: qty 5

## 2015-03-16 MED ORDER — MIDAZOLAM HCL 5 MG/5ML IJ SOLN
INTRAMUSCULAR | Status: DC | PRN
  Administered 2015-03-16: 2 mg via INTRAVENOUS

## 2015-03-16 MED ORDER — LIDOCAINE HCL (CARDIAC) 20 MG/ML IV SOLN
INTRAVENOUS | Status: DC | PRN
  Administered 2015-03-16: 100 mg via INTRAVENOUS

## 2015-03-16 MED ORDER — BUPIVACAINE-EPINEPHRINE 0.25% -1:200000 IJ SOLN
INTRAMUSCULAR | Status: DC | PRN
  Administered 2015-03-16: 96 mL

## 2015-03-16 MED ORDER — LIDOCAINE HCL (CARDIAC) 20 MG/ML IV SOLN
INTRAVENOUS | Status: AC
  Filled 2015-03-16: qty 5

## 2015-03-16 MED ORDER — ROCURONIUM BROMIDE 100 MG/10ML IV SOLN
INTRAVENOUS | Status: DC | PRN
  Administered 2015-03-16 (×2): 10 mg via INTRAVENOUS
  Administered 2015-03-16: 40 mg via INTRAVENOUS
  Administered 2015-03-16 (×4): 10 mg via INTRAVENOUS

## 2015-03-16 MED ORDER — SUGAMMADEX SODIUM 500 MG/5ML IV SOLN
INTRAVENOUS | Status: AC
  Filled 2015-03-16: qty 5

## 2015-03-16 MED ORDER — PROPOFOL 10 MG/ML IV BOLUS
INTRAVENOUS | Status: DC | PRN
  Administered 2015-03-16: 200 mg via INTRAVENOUS

## 2015-03-16 MED ORDER — ONDANSETRON HCL 4 MG/2ML IJ SOLN
INTRAMUSCULAR | Status: AC
  Filled 2015-03-16: qty 2

## 2015-03-16 MED ORDER — MIDAZOLAM HCL 2 MG/2ML IJ SOLN
INTRAMUSCULAR | Status: AC
  Filled 2015-03-16: qty 2

## 2015-03-16 MED ORDER — EPHEDRINE SULFATE 50 MG/ML IJ SOLN
INTRAMUSCULAR | Status: DC | PRN
  Administered 2015-03-16 (×4): 5 mg via INTRAVENOUS
  Administered 2015-03-16: 10 mg via INTRAVENOUS

## 2015-03-16 MED ORDER — CEFAZOLIN SODIUM-DEXTROSE 2-3 GM-% IV SOLR
2.0000 g | INTRAVENOUS | Status: AC
  Administered 2015-03-16: 2 g via INTRAVENOUS

## 2015-03-16 MED ORDER — PROMETHAZINE HCL 25 MG/ML IJ SOLN
6.2500 mg | INTRAMUSCULAR | Status: DC | PRN
  Administered 2015-03-16: 6.25 mg via INTRAVENOUS
  Filled 2015-03-16: qty 1

## 2015-03-16 MED ORDER — STERILE WATER FOR IRRIGATION IR SOLN
Status: DC | PRN
  Administered 2015-03-16: 2000 mL

## 2015-03-16 MED ORDER — HYDROMORPHONE HCL 1 MG/ML IJ SOLN
INTRAMUSCULAR | Status: AC
  Filled 2015-03-16: qty 1

## 2015-03-16 MED ORDER — DEXAMETHASONE SODIUM PHOSPHATE 10 MG/ML IJ SOLN
INTRAMUSCULAR | Status: AC
  Filled 2015-03-16: qty 1

## 2015-03-16 MED ORDER — DEXAMETHASONE SODIUM PHOSPHATE 10 MG/ML IJ SOLN
INTRAMUSCULAR | Status: DC | PRN
  Administered 2015-03-16: 10 mg via INTRAVENOUS

## 2015-03-16 MED ORDER — CEFAZOLIN SODIUM-DEXTROSE 2-3 GM-% IV SOLR
INTRAVENOUS | Status: AC
  Filled 2015-03-16: qty 50

## 2015-03-16 MED ORDER — SUGAMMADEX SODIUM 200 MG/2ML IV SOLN
INTRAVENOUS | Status: DC | PRN
  Administered 2015-03-16: 200 mg via INTRAVENOUS

## 2015-03-16 MED ORDER — LACTATED RINGERS IR SOLN
Status: DC | PRN
  Administered 2015-03-16: 1000 mL

## 2015-03-16 MED FILL — HYDROCODON-APAP 5-325: 5-325 | 3 days supply | Qty: 40 | Fill #0

## 2015-03-16 SURGICAL SUPPLY — 32 items
APPLIER CLIP 5 13 M/L LIGAMAX5 (MISCELLANEOUS) ×5
CABLE HIGH FREQUENCY MONO STRZ (ELECTRODE) ×5 IMPLANT
CHLORAPREP W/TINT 26ML (MISCELLANEOUS) ×5 IMPLANT
CLIP APPLIE 5 13 M/L LIGAMAX5 (MISCELLANEOUS) ×3 IMPLANT
COVER SURGICAL LIGHT HANDLE (MISCELLANEOUS) IMPLANT
DECANTER SPIKE VIAL GLASS SM (MISCELLANEOUS) ×5 IMPLANT
DEVICE SECURE STRAP 25 ABSORB (INSTRUMENTS) IMPLANT
DRAPE LAPAROSCOPIC ABDOMINAL (DRAPES) ×5 IMPLANT
DRAPE WARM FLUID 44X44 (DRAPE) ×5 IMPLANT
DRSG TEGADERM 2-3/8X2-3/4 SM (GAUZE/BANDAGES/DRESSINGS) ×10 IMPLANT
DRSG TEGADERM 4X4.75 (GAUZE/BANDAGES/DRESSINGS) ×5 IMPLANT
ELECT REM PT RETURN 9FT ADLT (ELECTROSURGICAL) ×5
ELECTRODE REM PT RTRN 9FT ADLT (ELECTROSURGICAL) ×3 IMPLANT
GAUZE SPONGE 2X2 8PLY STRL LF (GAUZE/BANDAGES/DRESSINGS) ×3 IMPLANT
GLOVE ECLIPSE 8.0 STRL XLNG CF (GLOVE) ×5 IMPLANT
GLOVE INDICATOR 8.0 STRL GRN (GLOVE) ×5 IMPLANT
GOWN STRL REUS W/TWL XL LVL3 (GOWN DISPOSABLE) ×20 IMPLANT
KIT BASIN OR (CUSTOM PROCEDURE TRAY) ×5 IMPLANT
MARKER SKIN DUAL TIP RULER LAB (MISCELLANEOUS) ×5 IMPLANT
MESH ULTRAPRO 6X6 15CM15CM (Mesh General) ×10 IMPLANT
SCISSORS LAP 5X35 DISP (ENDOMECHANICALS) ×5 IMPLANT
SET IRRIG TUBING LAPAROSCOPIC (IRRIGATION / IRRIGATOR) IMPLANT
SLEEVE XCEL OPT CAN 5 100 (ENDOMECHANICALS) ×5 IMPLANT
SPONGE GAUZE 2X2 STER 10/PKG (GAUZE/BANDAGES/DRESSINGS) ×2
SUT MNCRL AB 4-0 PS2 18 (SUTURE) ×5 IMPLANT
SUT VIC AB 3-0 SH 27 (SUTURE) ×4
SUT VIC AB 3-0 SH 27XBRD (SUTURE) ×6 IMPLANT
TACKER 5MM HERNIA 3.5CML NAB (ENDOMECHANICALS) IMPLANT
TOWEL OR 17X26 10 PK STRL BLUE (TOWEL DISPOSABLE) ×5 IMPLANT
TRAY LAPAROSCOPIC (CUSTOM PROCEDURE TRAY) ×5 IMPLANT
TROCAR BLADELESS OPT 5 100 (ENDOMECHANICALS) ×5 IMPLANT
TROCAR XCEL BLUNT TIP 100MML (ENDOMECHANICALS) ×5 IMPLANT

## 2015-03-16 NOTE — H&P (Signed)
Arthur Klein 02/13/2015 8:37 AM Location: [facility] Patient #: [id number] DOB: [birth year] Married / Language: English / Race: White Male   History of Present Illness  The patient is a 70 year old male who presents with an inguinal hernia. Pleasant chatty male who sent to me by primary care physician Dr. Ardeth Perfect for concern of a RIGHT inguinal hernia.  Pleasant active male. History of LEFT no hernia repaired in an open fashion in the 19 nineties. Had a robotic prostatectomy for prostate cancer by Dr. Dutch Gray in 2014. I believe no radiation therapy needed afterwards. Noted a little bit of swelling in his RIGHT groin about a year ago. Concern for perhaps small inguinal hernia. Dr. Alinda Money agreed. He calm larger and more painful. Because it is worsened, he wished to consider surgery. He about doing in [place] but reconsider to be in [place]. Wife works at [employer]. Patient sent to our group. History of numerous back and major joint issues with numerous prior surgeries. Has a lot of chronic pain. Tries to avoid narcotics. Spirits with oxycodone after one surgery. He has a bowel movement every day. 15 minutes and up a flight of stairs. Often joint and back pain makes him stop. No definite cardiac issues in the past. Does not like to take medications. Tends to avoid them. Tries to use Tylenol only. A colonoscopy. Recalls his wife hearing an irregular heart rate. Recalls getting a Holter monitor that was completely normal.  No new events.  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 Open Inguinal  Hernia Surgery Left. Oral Surgery Prostate Surgery - Removal Shoulder Surgery Bilateral.  Diagnostic Studies History Elbert Ewings, CMA; 02/13/2015 8:37 AM) Colonoscopy never  Allergies Adin Hector, MD; 02/13/2015 9:30 AM) Erythrocin Stearate *MACROLIDES* Lisinopril *CHEMICALS* OxyCODONE HCl *ANALGESICS - OPIOID* confusion & memory loss  Medication History Elbert Ewings, CMA; 02/13/2015 8:38 AM) No Current Medications Medications Reconciled  Social History Elbert Ewings, CMA; 02/13/2015 8:37 AM) Alcohol use Remotely quit alcohol use. No caffeine use Tobacco use Never smoker.  Family History Elbert Ewings, [place]; 02/13/2015 8:37 AM) Cerebrovascular Accident Brother, Father. Colon Polyps Mother. Diabetes Mellitus Brother, Mother. Heart Disease Brother, Mother. Hypertension Mother. Malignant Neoplasm Of Pancreas Brother. Melanoma Family Members In General. Prostate Cancer Brother, Father.    Review of Systems Elbert Ewings CMA; 02/13/2015 8:37 AM) General Not Present- Appetite Loss, Chills, Fatigue, Fever, Night Sweats, Weight Gain and Weight Loss. Skin Not Present- Change in Wart/Mole, Dryness, Hives, Jaundice, New Lesions, Non-Healing Wounds, Rash and Ulcer. HEENT Present- Wears glasses/contact lenses. Not Present- Earache, Hearing Loss, Hoarseness, Nose Bleed, Oral Ulcers, Ringing in the Ears, Seasonal Allergies, Sinus Pain, Sore Throat, Visual Disturbances and Yellow Eyes. Respiratory Present- Snoring. Not Present- Bloody sputum, Chronic Cough, Difficulty Breathing and Wheezing. Breast Not Present- Breast Mass, Breast Pain, Nipple Discharge and Skin Changes. Cardiovascular Present- Palpitations. Not Present- Chest Pain, Difficulty Breathing Lying Down, Leg Cramps, Rapid Heart Rate, Shortness of Breath and Swelling of Extremities. Gastrointestinal Not Present- Abdominal Pain, Bloating, Bloody Stool, Change in Bowel Habits, Chronic diarrhea, Constipation,  Difficulty Swallowing, Excessive gas, Gets full quickly at meals, Hemorrhoids, Indigestion, Nausea, Rectal Pain and Vomiting. Male Genitourinary Present- Impotence. Not Present- Blood in  Urine, Change in Urinary Stream, Frequency, Nocturia, Painful Urination, Urgency and Urine Leakage. Musculoskeletal Present- Back Pain and Joint Pain. Not Present- Joint Stiffness, Muscle Pain, Muscle Weakness and Swelling of Extremities. Neurological Not Present- Decreased Memory, Fainting, Headaches, Numbness, Seizures, Tingling, Tremor, Trouble walking and Weakness. Psychiatric Not Present- Anxiety, Bipolar, Change in Sleep Pattern, Depression, Fearful and Frequent crying. Endocrine Not Present- Cold Intolerance, Excessive Hunger, Hair Changes, Heat Intolerance, Hot flashes and New Diabetes. Hematology Not Present- Easy Bruising, Excessive bleeding, Gland problems, HIV and Persistent Infections.  Vitals Elbert Ewings CMA; 02/13/2015 8:39 AM) 02/13/2015 8:38 AM Weight: 215 lb Height: 69in Body Surface Area: 2.13 m Body Mass Index: 31.75 kg/m  Temp.: 98.43F(Temporal)  Pulse: 96 (Regular)  BP: 130/70 (Sitting, Left Arm, Standard)       Physical Exam Adin Hector MD; 02/13/2015 9:28 AM) General Mental Status-Alert. General Appearance-Not in acute distress, Not Sickly. Orientation-Oriented X3. Hydration-Well hydrated. Voice-Normal.  Integumentary Global Assessment Upon inspection and palpation of skin surfaces of the - Axillae: non-tender, no inflammation or ulceration, no drainage. and Distribution of scalp and body hair is normal. General Characteristics Temperature - normal warmth is noted.  Head and Neck Head-normocephalic, atraumatic with no lesions or palpable masses. Face Global Assessment - atraumatic, no absence of expression. Neck Global Assessment - no abnormal movements, no bruit auscultated on the right, no bruit auscultated on the left, no decreased  range of motion, non-tender. Trachea-midline. Thyroid Gland Characteristics - non-tender.  Eye Eyeball - Left-Extraocular movements intact, No Nystagmus. Eyeball - Right-Extraocular movements intact, No Nystagmus. Cornea - Left-No Hazy. Cornea - Right-No Hazy. Sclera/Conjunctiva - Left-No scleral icterus, No Discharge. Sclera/Conjunctiva - Right-No scleral icterus, No Discharge. Pupil - Left-Direct reaction to light normal. Pupil - Right-Direct reaction to light normal.  ENMT Ears Pinna - Left - no drainage observed, no generalized tenderness observed. Right - no drainage observed, no generalized tenderness observed. Nose and Sinuses External Inspection of the Nose - no destructive lesion observed. Inspection of the nares - Left - quiet respiration. Right - quiet respiration. Mouth and Throat Lips - Upper Lip - no fissures observed, no pallor noted. Lower Lip - no fissures observed, no pallor noted. Nasopharynx - no discharge present. Oral Cavity/Oropharynx - Tongue - no dryness observed. Oral Mucosa - no cyanosis observed. Hypopharynx - no evidence of airway distress observed.  Chest and Lung Exam Inspection Movements - Normal and Symmetrical. Accessory muscles - No use of accessory muscles in breathing. Palpation Palpation of the chest reveals - Non-tender. Auscultation Breath sounds - Normal and Clear.  Cardiovascular Auscultation Rhythm - Regular. Murmurs & Other Heart Sounds - Auscultation of the heart reveals - No Murmurs and No Systolic Clicks.  Abdomen Inspection Inspection of the abdomen reveals - No Visible peristalsis and No Abnormal pulsations. Umbilicus - No Bleeding, No Urine drainage. Palpation/Percussion Palpation and Percussion of the abdomen reveal - Soft, Non Tender, No Rebound tenderness, No Rigidity (guarding) and No Cutaneous hyperesthesia. Note: Obese but soft. No umbilical hernia. No abdominal pain. No guarding.   Male  Genitourinary Sexual Maturity Tanner 5 - Adult hair pattern and Adult penile size and shape. 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 He has sensitive but reducible. A large testes but no discrete mass. Scar in LEFT groin. No evidence of LEFT inguinal hernia. Large mons pubis. Hygiene good.   Peripheral Vascular Upper Extremity Inspection - Left - No Cyanotic nailbeds, Not Ischemic. Right - No  Cyanotic nailbeds, Not Ischemic.  Neurologic Neurologic evaluation reveals -normal attention span and ability to concentrate, able to name objects and repeat phrases. Appropriate fund of knowledge , normal sensation and normal coordination. Mental Status Affect - not angry, not paranoid. Cranial Nerves-Normal Bilaterally. Gait-Normal.  Neuropsychiatric Mental status exam performed with findings of-able to articulate well with normal speech/language, rate, volume and coherence, thought content normal with ability to perform basic computations and apply abstract reasoning and no evidence of hallucinations, delusions, obsessions or homicidal/suicidal ideation.  Musculoskeletal Global Assessment Spine, Ribs and Pelvis - no instability, subluxation or laxity. Right Upper Extremity - no instability, subluxation or laxity.  Lymphatic Head & Neck  General Head & Neck Lymphatics: Bilateral - Description - No Localized lymphadenopathy. Axillary  General Axillary Region: Bilateral - Description - No Localized lymphadenopathy. Femoral & Inguinal  Generalized Femoral & Inguinal Lymphatics: Left - Description - No Localized lymphadenopathy. Right - Description - No Localized lymphadenopathy.    Assessment & Plan Adin Hector MD; 02/13/2015 9:30 AM) RIGHT INGUINAL HERNIA (K40.90) Impression: Obvious RIGHT inguinal hernia with increasing size and symptoms. I think he would benefit from repair. Reasonable start out laparoscopically  but increased conversion rate to open given history of robotic prostatectomy. Doubt any left-sided problems since nothing on exam nor symptoms. Patient would prefer outpatient surgery at [facility] long on a Monday to be convenient for his wife to help him recover. Because he's had this for at least a year and has not any major warning sign, he is willing to wait a month or so until we can set it up that way. That is reasonable.  He is hoping to avoid narcotics after surgery. I cautioned that was not realistic. I agree and maximizing nonnarcotic pain control with using Ice / Heat and over-the-counter nonsteroidals first makes sense. We will try and avoid oxycodone since that seem to bother him. All I can do is advise though. He understands.  Consider exercising and swimming pool with swimming to stay active.Marland Kitchen He noted that helped after his knee surgery. He will reconsider it.  Consider getting colonoscopy as it is the #2 cancer.  Current Plans You are being scheduled for surgery - Our schedulers will call you.  You should hear from our office's scheduling department within 5 working days about the location, date, and time of surgery. We try to make accommodations for patient's preferences in scheduling surgery, but sometimes the OR schedule or the surgeon's schedule prevents [place] from making those accommodations.  If you have not heard from our office [phone]) in 5 working days, call the office and ask for your surgeon's nurse.  If you have other questions about your diagnosis, plan, or surgery, call the office and ask for your surgeon's nurse.  The anatomy & physiology of the abdominal wall and pelvic floor was discussed. The pathophysiology of hernias in the inguinal and pelvic region was discussed. Natural history risks such as progressive enlargement, pain, incarceration, and strangulation was discussed. Contributors to complications such as smoking, obesity, diabetes, prior surgery, etc were  discussed.  I feel the risks of no intervention will lead to serious problems that outweigh the operative risks; therefore, I recommended surgery to reduce and repair the hernia. I explained laparoscopic techniques with possible need for an open approach. I noted usual use of mesh to patch and/or buttress hernia repair  Risks such as bleeding, infection, abscess, need for further treatment, heart attack, death, and other risks were discussed. I noted a good  likelihood this will help address the problem. Goals of post-operative recovery were discussed as well. Possibility that this will not correct all symptoms was explained. I stressed the importance of low-impact activity, aggressive pain control, avoiding constipation, & not pushing through pain to minimize risk of post-operative chronic pain or injury. Possibility of reherniation was discussed. We will work to minimize complications.  An educational handout further explaining the pathology & treatment options was given as well. Questions were answered. The patient expresses understanding & wishes to proceed with surgery.  Pt Education - Pamphlet Given - Hernia Surgery: discussed with patient and provided information. Written instructions provided Pt Education - [facility] (Alise [place])  Adin Hector, M.D., F.A.C.S. Gastrointestinal and Minimally Invasive Surgery [facility] [address], [place], [place] [address] [phone] Main / Paging

## 2015-03-16 NOTE — Progress Notes (Signed)
Dr Ola Spurr aware of pt's HR, BP.  Pt ok for discharge.

## 2015-03-16 NOTE — Anesthesia Procedure Notes (Signed)
Procedure Name: Intubation Date/Time: 03/16/2015 12:28 PM Performed by: Maxwell Caul Pre-anesthesia Checklist: Patient identified, Emergency Drugs available, Suction available and Patient being monitored Patient Re-evaluated:Patient Re-evaluated prior to inductionOxygen Delivery Method: Circle System Utilized Preoxygenation: Pre-oxygenation with 100% oxygen Intubation Type: IV induction Ventilation: Mask ventilation without difficulty and Oral airway inserted - appropriate to patient size Laryngoscope Size: Mac and 4 Grade View: Grade III Tube type: Oral Tube size: 7.5 mm Number of attempts: 1 Airway Equipment and Method: Stylet,  Oral airway and Bougie stylet Placement Confirmation: ETT inserted through vocal cords under direct vision,  positive ETCO2 and breath sounds checked- equal and bilateral Secured at: 21 cm Tube secured with: Tape Dental Injury: Teeth and Oropharynx as per pre-operative assessment

## 2015-03-16 NOTE — Anesthesia Preprocedure Evaluation (Addendum)
Anesthesia Evaluation  Patient identified by MRN, date of birth, ID band Patient awake    Reviewed: Allergy & Precautions, NPO status , Patient's Chart, lab work & pertinent test results  Airway Mallampati: III  TM Distance: >3 FB Neck ROM: Limited    Dental  (+) Dental Advisory Given   Pulmonary neg pulmonary ROS,    breath sounds clear to auscultation       Cardiovascular hypertension,  Rhythm:Regular Rate:Normal     Neuro/Psych negative neurological ROS     GI/Hepatic Neg liver ROS, GERD  ,  Endo/Other  diabetes  Renal/GU negative Renal ROS     Musculoskeletal  (+) Arthritis ,   Abdominal   Peds  Hematology negative hematology ROS (+)   Anesthesia Other Findings   Reproductive/Obstetrics                            Lab Results  Component Value Date   WBC 7.5 03/13/2015   HGB 15.7 03/13/2015   HCT 48.2 03/13/2015   MCV 90.4 03/13/2015   PLT 147* 03/13/2015   Lab Results  Component Value Date   CREATININE 0.94 03/13/2015   BUN 17 03/13/2015   NA 142 03/13/2015   K 4.5 03/13/2015   CL 107 03/13/2015   CO2 26 03/13/2015    Anesthesia Physical Anesthesia Plan  ASA: II  Anesthesia Plan: General   Post-op Pain Management:    Induction: Intravenous  Airway Management Planned: Oral ETT  Additional Equipment:   Intra-op Plan:   Post-operative Plan: Extubation in OR  Informed Consent: I have reviewed the patients History and Physical, chart, labs and discussed the procedure including the risks, benefits and alternatives for the proposed anesthesia with the patient or authorized representative who has indicated his/her understanding and acceptance.   Dental advisory given  Plan Discussed with: CRNA  Anesthesia Plan Comments:         Anesthesia Quick Evaluation

## 2015-03-16 NOTE — Discharge Instructions (Signed)
HERNIA REPAIR: POST OP INSTRUCTIONS ° °1. DIET: Follow a light bland diet the first 24 hours after arrival home, such as soup, liquids, crackers, etc.  Be sure to include lots of fluids daily.  Avoid fast food or heavy meals as your are more likely to get nauseated.  Eat a low fat the next few days after surgery. °2. Take your usually prescribed home medications unless otherwise directed. °3. PAIN CONTROL: °a. Pain is best controlled by a usual combination of three different methods TOGETHER: °i. Ice/Heat °ii. Over the counter pain medication °iii. Prescription pain medication °b. Most patients will experience some swelling and bruising around the hernia(s) such as the bellybutton, groins, or old incisions.  Ice packs or heating pads (30-60 minutes up to 6 times a day) will help. Use ice for the first few days to help decrease swelling and bruising, then switch to heat to help relax tight/sore spots and speed recovery.  Some people prefer to use ice alone, heat alone, alternating between ice & heat.  Experiment to what works for you.  Swelling and bruising can take several weeks to resolve.   °c. It is helpful to take an over-the-counter pain medication regularly for the first few weeks.  Choose one of the following that works best for you: °i. Naproxen (Aleve, etc)  Two 220mg tabs twice a day °ii. Ibuprofen (Advil, etc) Three 200mg tabs four times a day (every meal & bedtime) °iii. Acetaminophen (Tylenol, etc) 325-650mg four times a day (every meal & bedtime) °d. A  prescription for pain medication should be given to you upon discharge.  Take your pain medication as prescribed.  °i. If you are having problems/concerns with the prescription medicine (does not control pain, nausea, vomiting, rash, itching, etc), please call us [phone] to see if we need to switch you to a different pain medicine that will work better for you and/or control your side effect better. °ii. If you need a refill on your pain  medication, please contact your pharmacy.  They will contact our office to request authorization. Prescriptions will not be filled after 5 pm or on week-ends. °4. Avoid getting constipated.  Between the surgery and the pain medications, it is common to experience some constipation.  Increasing fluid intake and taking a fiber supplement (such as Metamucil, Citrucel, FiberCon, MiraLax, etc) 1-2 times a day regularly will usually help prevent this problem from occurring.  A mild laxative (prune juice, Milk of Magnesia, MiraLax, etc) should be taken according to package directions if there are no bowel movements after 48 hours.   °5. Wash / shower every day.  You may shower over the dressings as they are waterproof.   °6. Remove your waterproof bandages 5 days after surgery.  You may leave the incision open to air.  You may replace a dressing/Band-Aid to cover the incision for comfort if you wish.  Continue to shower over incision(s) after the dressing is off. ° ° ° °7. ACTIVITIES as tolerated:   °a. You may resume regular (light) daily activities beginning the next day--such as daily self-care, walking, climbing stairs--gradually increasing activities as tolerated.  If you can walk 30 minutes without difficulty, it is safe to try more intense activity such as jogging, treadmill, bicycling, low-impact aerobics, swimming, etc. °b. Save the most intensive and strenuous activity for last such as sit-ups, heavy lifting, contact sports, etc  Refrain from any heavy lifting or straining until you are off narcotics for pain control.   °  c. DO NOT PUSH THROUGH PAIN.  Let pain be your guide: If it hurts to do something, don't do it.  Pain is your body warning you to avoid that activity for another week until the pain goes down. d. You may drive when you are no longer taking prescription pain medication, you can comfortably wear a seatbelt, and you can safely maneuver your car and apply brakes. e. Dennis Bast may have sexual intercourse  when it is comfortable.  8. FOLLOW UP in our office a. Please call [facility] at [phone] to set up an appointment to see your surgeon in the office for a follow-up appointment approximately 2-3 weeks after your surgery. b. Make sure that you call for this appointment the day you arrive home to insure a convenient appointment time. 9.  IF YOU HAVE DISABILITY OR FAMILY LEAVE FORMS, BRING THEM TO THE OFFICE FOR PROCESSING.  DO NOT GIVE THEM TO YOUR DOCTOR.  WHEN TO CALL us [phone]: 1. Poor pain control 2. Reactions / problems with new medications (rash/itching, nausea, etc)  3. Fever over 101.5 F (38.5 C) 4. Inability to urinate 5. Nausea and/or vomiting 6. Worsening swelling or bruising 7. Continued bleeding from incision. 8. Increased pain, redness, or drainage from the incision   The clinic staff is available to answer your questions during regular business hours (8:30am-5pm).  Please dont hesitate to call and ask to speak to one of our nurses for clinical concerns.   If you have a medical emergency, go to the nearest emergency room or call 911.  A surgeon from [facility] is always on call at the hospitals in [facility], [place], [place], [place], [place]  [address] ?  [address] [address], [place], [place]   [address] MAIN: [phone] ? TOLL FREE: [phone] ? FAX: [phone] [email or web address]  Managing Pain  Pain after surgery or related to activity is often due to strain/injury to muscle, tendon, nerves and/or incisions.  This pain is usually short-term and will improve in a few months.   Many people find it helpful to do the following things TOGETHER to help speed the process of healing and to get back to regular activity more quickly:  1. Avoid heavy physical activity at first a. No lifting greater than 20 pounds at first, then increase to lifting as tolerated over the next few weeks b. Do not push  through the pain.  Listen to your body and avoid positions and maneuvers than reproduce the pain.  Wait a few days before trying something more intense c. Walking is okay as tolerated, but go slowly and stop when getting sore.  If you can walk 30 minutes without stopping or pain, you can try more intense activity (running, jogging, aerobics, cycling, swimming, treadmill, sex, sports, weightlifting, etc ) d. Remember: If it hurts to do it, then dont do it!  2. Take Acetaminophen Anti-inflammatory medication i. Acetaminophen 500mg  tabs (Tylenol) 1-2 pills with every meal and just before bedtime (avoid if you have liver problems) ii. Take with food/snack around the clock for 1-2 weeks iii. This helps the muscle and nerve tissues become less irritable and calm down faster  3. Use a Heating pad or Ice/Cold Pack a. 4-6 times a day b. May use warm bath/hottub  or showers  4. Try Gentle Massage and/or Stretching  a. at the area of pain many times a day b. stop if you feel pain - do not overdo  it  Try these steps together to help you body heal faster and avoid making things get worse.  Doing just one of these things may not be enough.    If you are not getting better after two weeks or are noticing you are getting worse, contact our office for further advice; we may need to re-evaluate you & see what other things we can do to help.  GETTING TO GOOD BOWEL HEALTH. Irregular bowel habits such as constipation and diarrhea can lead to many problems over time.  Having one soft bowel movement a day is the most important way to prevent further problems.  The anorectal canal is designed to handle stretching and feces to safely manage our ability to get rid of solid waste (feces, poop, stool) out of our body.  BUT, hard constipated stools can act like ripping concrete bricks and diarrhea can be a burning fire to this very sensitive area of our body, causing inflamed hemorrhoids, anal fissures, increasing risk  is perirectal abscesses, abdominal pain/bloating, an making irritable bowel worse.      The goal: ONE SOFT BOWEL MOVEMENT A DAY!  To have soft, regular bowel movements:   Drink plenty of fluids, consider 4-6 tall glasses of water a day.    Take plenty of fiber.  Fiber is the undigested part of plant food that passes into the colon, acting s natures broom to encourage bowel motility and movement.  Fiber can absorb and hold large amounts of water. This results in a larger, bulkier stool, which is soft and easier to pass. Work gradually over several weeks up to 6 servings a day of fiber (25g a day even more if needed) in the form of: o Vegetables -- Root (potatoes, carrots, turnips), leafy green (lettuce, salad greens, celery, spinach), or cooked high residue (cabbage, broccoli, etc) o Fruit -- Fresh (unpeeled skin & pulp), Dried (prunes, apricots, cherries, etc ),  or stewed ( applesauce)  o Whole grain breads, pasta, etc (whole wheat)  o Bran cereals   Bulking Agents -- This type of water-retaining fiber generally is easily obtained each day by one of the following:  o Psyllium bran -- The psyllium plant is remarkable because its ground seeds can retain so much water. This product is available as Metamucil, Konsyl, Effersyllium, Per Diem Fiber, or the less expensive generic preparation in drug and health food stores. Although labeled a laxative, it really is not a laxative.  o Methylcellulose -- This is another fiber derived from wood which also retains water. It is available as Citrucel. o Polyethylene Glycol - and artificial fiber commonly called Miralax or Glycolax.  It is helpful for people with gassy or bloated feelings with regular fiber o Flax Seed - a less gassy fiber than psyllium  No reading or other relaxing activity while on the toilet. If bowel movements take longer than 5 minutes, you are too constipated  AVOID CONSTIPATION.  High fiber and water intake usually takes care of  this.  Sometimes a laxative is needed to stimulate more frequent bowel movements, but   Laxatives are not a good long-term solution as it can wear the colon out.  They can help jump-start bowels if constipated, but should be relied on constantly without discussing with your doctor o Osmotics (Milk of Magnesia, Fleets phosphosoda, Magnesium citrate, MiraLax, GoLytely) are safer than  o Stimulants (Senokot, Castor Oil, Dulcolax, Ex Lax)    o Avoid taking laxatives for more than 7 days in a row.  IF SEVERELY CONSTIPATED, try a Bowel Retraining Program: o Do not use laxatives.  o Eat a diet high in roughage, such as bran cereals and leafy vegetables.  o Drink six (6) ounces of prune or apricot juice each morning.  o Eat two (2) large servings of stewed fruit each day.  o Take one (1) heaping tablespoon of a psyllium-based bulking agent twice a day. Use sugar-free sweetener when possible to avoid excessive calories.  o Eat a normal breakfast.  o Set aside 15 minutes after breakfast to sit on the toilet, but do not strain to have a bowel movement.  o If you do not have a bowel movement by the third day, use an enema and repeat the above steps.   Controlling diarrhea o Switch to liquids and simpler foods for a few days to avoid stressing your intestines further. o Avoid dairy products (especially milk & ice cream) for a short time.  The intestines often can lose the ability to digest lactose when stressed. o Avoid foods that cause gassiness or bloating.  Typical foods include beans and other legumes, cabbage, broccoli, and dairy foods.  Every person has some sensitivity to other foods, so listen to our body and avoid those foods that trigger problems for you. o Adding fiber (Citrucel, Metamucil, psyllium, Miralax) gradually can help thicken stools by absorbing excess fluid and retrain the intestines to act more normally.  Slowly increase the dose over a few weeks.  Too much fiber too soon can backfire  and cause cramping & bloating. o Probiotics (such as active yogurt, Align, etc) may help repopulate the intestines and colon with normal bacteria and calm down a sensitive digestive tract.  Most studies show it to be of mild help, though, and such products can be costly. o Medicines: - Bismuth subsalicylate (ex. Kayopectate, Pepto Bismol) every 30 minutes for up to 6 doses can help control diarrhea.  Avoid if pregnant. - Loperamide (Immodium) can slow down diarrhea.  Start with two tablets (4mg  total) first and then try one tablet every 6 hours.  Avoid if you are having fevers or severe pain.  If you are not better or start feeling worse, stop all medicines and call your doctor for advice o Call your doctor if you are getting worse or not better.  Sometimes further testing (cultures, endoscopy, X-ray studies, bloodwork, etc) may be needed to help diagnose and treat the cause of the diarrhea.  TROUBLESHOOTING IRREGULAR BOWELS 1) Avoid extremes of bowel movements (no bad constipation/diarrhea) 2) Miralax 17gm mixed in 8oz. water or juice-daily. May use BID as needed.  3) Gas-x,Phazyme, etc. as needed for gas & bloating.  4) Soft,bland diet. No spicy,greasy,fried foods.  5) Prilosec over-the-counter as needed  6) May hold gluten/wheat products from diet to see if symptoms improve.  7)  May try probiotics (Align, Activa, etc) to help calm the bowels down 7) If symptoms become worse call back immediately.  Inguinal Hernia, Adult Muscles help keep everything in the body in its proper place. But if a weak spot in the muscles develops, something can poke through. That is called a hernia. When this happens in the lower part of the belly (abdomen), it is called an inguinal hernia. (It takes its name from a part of the body in this region called the inguinal canal.) A weak spot in the wall of muscles lets some fat or part of the small intestine bulge through. An inguinal hernia can develop at any age. Men  get  them more often than women. CAUSES  In adults, an inguinal hernia develops over time.  It can be triggered by:  Suddenly straining the muscles of the lower abdomen.  Lifting heavy objects.  Straining to have a bowel movement. Difficult bowel movements (constipation) can lead to this.  Constant coughing. This may be caused by smoking or lung disease.  Being overweight.  Being pregnant.  Working at a job that requires long periods of standing or heavy lifting.  Having had an inguinal hernia before. One type can be an emergency situation. It is called a strangulated inguinal hernia. It develops if part of the small intestine slips through the weak spot and cannot get back into the abdomen. The blood supply can be cut off. If that happens, part of the intestine may die. This situation requires emergency surgery. SYMPTOMS  Often, a small inguinal hernia has no symptoms. It is found when a healthcare provider does a physical exam. Larger hernias usually have symptoms.   In adults, symptoms may include:  A lump in the groin. This is easier to see when the person is standing. It might disappear when lying down.  In men, a lump in the scrotum.  Pain or burning in the groin. This occurs especially when lifting, straining or coughing.  A dull ache or feeling of pressure in the groin.  Signs of a strangulated hernia can include:  A bulge in the groin that becomes very painful and tender to the touch.  A bulge that turns red or purple.  Fever, nausea and vomiting.  Inability to have a bowel movement or to pass gas. DIAGNOSIS  To decide if you have an inguinal hernia, a healthcare provider will probably do a physical examination.  This will include asking questions about any symptoms you have noticed.  The healthcare provider might feel the groin area and ask you to cough. If an inguinal hernia is felt, the healthcare provider may try to slide it back into the abdomen.  Usually  no other tests are needed. TREATMENT  Treatments can vary. The size of the hernia makes a difference. Options include:  Watchful waiting. This is often suggested if the hernia is small and you have had no symptoms.  No medical procedure will be done unless symptoms develop.  You will need to watch closely for symptoms. If any occur, contact your healthcare provider right away.  Surgery. This is used if the hernia is larger or you have symptoms.  Open surgery. This is usually an outpatient procedure (you will not stay overnight in a hospital). An cut (incision) is made through the skin in the groin. The hernia is put back inside the abdomen. The weak area in the muscles is then repaired by herniorrhaphy or hernioplasty. Herniorrhaphy: in this type of surgery, the weak muscles are sewn back together. Hernioplasty: a patch or mesh is used to close the weak area in the abdominal wall.  Laparoscopy. In this procedure, a surgeon makes small incisions. A thin tube with a tiny video camera (called a laparoscope) is put into the abdomen. The surgeon repairs the hernia with mesh by looking with the video camera and using two long instruments. HOME CARE INSTRUCTIONS   After surgery to repair an inguinal hernia:  You will need to take pain medicine prescribed by your healthcare provider. Follow all directions carefully.  You will need to take care of the wound from the incision.  Your activity will be restricted for awhile. This  will probably include no heavy lifting for several weeks. You also should not do anything too active for a few weeks. When you can return to work will depend on the type of job that you have.  During "watchful waiting" periods, you should:  Maintain a healthy weight.  Eat a diet high in fiber (fruits, vegetables and whole grains).  Drink plenty of fluids to avoid constipation. This means drinking enough water and other liquids to keep your urine clear or pale yellow.  Do  not lift heavy objects.  Do not stand for long periods of time.  Quit smoking. This should keep you from developing a frequent cough. SEEK MEDICAL CARE IF:   A bulge develops in your groin area.  You feel pain, a burning sensation or pressure in the groin. This might be worse if you are lifting or straining.  You develop a fever of more than 100.5 F (38.1 C). SEEK IMMEDIATE MEDICAL CARE IF:   Pain in the groin increases suddenly.  A bulge in the groin gets bigger suddenly and does not go down.  For men, there is sudden pain in the scrotum. Or, the size of the scrotum increases.  A bulge in the groin area becomes red or purple and is painful to touch.  You have nausea or vomiting that does not go away.  You feel your heart beating much faster than normal.  You cannot have a bowel movement or pass gas.  You develop a fever of more than 102.0 F (38.9 C).   This information is not intended to replace advice given to you by your health care provider. Make sure you discuss any questions you have with your health care provider.   Document Released: 07/06/2008 Document Revised: 05/12/2011 Document Reviewed: 08/21/2014 Elsevier Interactive Patient Education 2016 [place] Anesthesia, Adult, Care After Refer to this sheet in the next few weeks. These instructions provide you with information on caring for yourself after your procedure. Your health care provider may also give you more specific instructions. Your treatment has been planned according to current medical practices, but problems sometimes occur. Call your health care provider if you have any problems or questions after your procedure. WHAT TO EXPECT AFTER THE PROCEDURE After the procedure, it is typical to experience:  Sleepiness.  Nausea and vomiting. HOME CARE INSTRUCTIONS  For the first 24 hours after general anesthesia:  Have a responsible person with you.  Do not drive a car. If you  are alone, do not take public transportation.  Do not drink alcohol.  Do not take medicine that has not been prescribed by your health care provider.  Do not sign important papers or make important decisions.  You may resume a normal diet and activities as directed by your health care provider.  Change bandages (dressings) as directed.  If you have questions or problems that seem related to general anesthesia, call the hospital and ask for the anesthetist or anesthesiologist on call. SEEK MEDICAL CARE IF:  You have nausea and vomiting that continue the day after anesthesia.  You develop a rash. SEEK IMMEDIATE MEDICAL CARE IF:   You have difficulty breathing.  You have chest pain.  You have any allergic problems.   This information is not intended to replace advice given to you by your health care provider. Make sure you discuss any questions you have with your health care provider.   Document Released: 05/26/2000 Document Revised: 03/10/2014 Document  06/18/2011 °Elsevier Interactive Patient Education ©2016 [employer] ° °

## 2015-03-16 NOTE — Op Note (Signed)
03/16/2015  3:26 PM  PATIENT:  Arthur Klein  70 y.o. male  Patient Care Team: Velna Hatchet, MD as PCP - General (Internal Medicine) Dimitri Ped, RN as [place] [facility]  PRE-OPERATIVE DIAGNOSIS:    Right inguinal hernia Possible left inguinal hernia  POST-OPERATIVE DIAGNOSIS:    BILATERAL FEMORAL HERNIAS BILATERAL INGUINAL HERNIAS  PROCEDURE:  Procedure(s): LAPAROSCOPIC LYSIS OF ADHESIONS x45 min LAPAROSCOPIC REPAIR OF BILATERAL FEMORAL HERNIAS LAPAROSCOPIC REPAIR OF BILATERAL INGUINAL HERNIAS  INSERTION OF MESH  SURGEON:  Surgeon(s): Michael Boston, MD  ASSISTANT: RN   ANESTHESIA:   Regional ilioinguinal and genitofemoral and spermatic cord nerve blocks with GETA  EBL:  Total I/O In: 1000 [I.V.:1000] Out: -   Delay start of Pharmacological VTE agent (>24hrs) due to surgical blood loss or risk of bleeding:  no  DRAINS: NONE  SPECIMEN:  NONE  DISPOSITION OF SPECIMEN:  N/A  COUNTS:  YES  PLAN OF CARE: Discharge to home after PACU  PATIENT DISPOSITION:  PACU - hemodynamically stable.  INDICATION: Patient with symptomatic right inguinal hernia.  Possible small LIH.   H/o robotic prostatectomy.  I offered laparoscopic explorartion  The anatomy & physiology of the abdominal wall and pelvic floor was discussed.  The pathophysiology of hernias in the inguinal and pelvic region was discussed.  Natural history risks such as progressive enlargement, pain, incarceration & strangulation was discussed.   Contributors to complications such as smoking, obesity, diabetes, prior surgery, etc were discussed.    I feel the risks of no intervention will lead to serious problems that outweigh the operative risks; therefore, I recommended surgery to reduce and repair the hernia.  I explained laparoscopic techniques with possible need for an open approach.  I noted usual use of mesh to patch and/or buttress hernia repair  Risks such as bleeding,  infection, abscess, need for further treatment, heart attack, death, and other risks were discussed.  I noted a good likelihood this will help address the problem.   Goals of post-operative recovery were discussed as well.  Possibility that this will not correct all symptoms was explained.  I stressed the importance of low-impact activity, aggressive pain control, avoiding constipation, & not pushing through pain to minimize risk of post-operative chronic pain or injury. Possibility of reherniation was discussed.  We will work to minimize complications.     An educational handout further explaining the pathology & treatment options was given as well.  Questions were answered.  The patient expresses understanding & wishes to proceed with surgery.  OR FINDINGS: Large right indirect inguinal hernia.  Small femoral hernia as well.    Small indirect left inguinal hernia.  Small femoral hernia as well.  Intact spermatic cord in inguinal canal.  Small spermatic cord lipoma.  No inguinal canal mass  DESCRIPTION:   The patient was identified & brought into the operating room. The patient was positioned supine with arms tucked. SCDs were active during the entire case. The patient underwent general anesthesia without any difficulty.  The abdomen was prepped and draped in a sterile fashion. The patient's bladder was emptied.  A Surgical Timeout confirmed our plan.  I made a transverse incision through the inferior umbilical fold.  I made a small transverse nick through the anterior rectus fascia contralateral to the inguinal hernia side and placed a 0-vicryl stitch through the fascia.  I placed a Hasson trocar into the preperitoneal plane.  Entry was clean.  We induced carbon dioxide insufflation. Camera inspection revealed  no injury.  Some preperitoneal adhesions consistent with the prior robotic prostatectomy.  I used a 59mm angled scope to bluntly free the peritoneum off the infraumbilical anterior abdominal  wall.  I created enough of a preperitoneal pocket to place 47mm ports into the right & left mid-abdomen into this preperitoneal cavity.  I focused attention on the right side since that was the dominant hernia side.   I used blunt & focused sharp dissection to free the peritoneum off the flank and down to the pubic rim.  I freed the anteriolateral bladder wall off the anteriolateral pelvic wall, sparing midline attachments.   Dense adhesions of the dome of the bladder to the pubic rim as expected.  I located a large swath of peritoneum going into a hernia fascial defect at the internal ring consistent with an hernia.  I gradually freed the peritoneal hernia sac off safely and reduced it into the preperitoneal space.  I freed the peritoneum off the spermatic vessels & vas deferens.  Had a chronically thickened hernia sac with a moderate volume of preperitoneal fat.  It was rather vascular.  I ended up having to clip some vessels coming off of the right gonadal..  I freed peritoneum off the retroperitoneum along the psoas muscle.    I checked & assured hemostasis.  He had a rather large hernia sac.  I excised that out of the peritoneum.  I closed that peritoneal defect stalk using laparoscopic intracorporeal suturing 3-0 Vicryl suture.  I turned attention on the opposite side.  I did dissection in a similar, mirror-image fashion. The patient had another smaller indirect hernia.  Small cord lipoma reduced out.  Intact vessels.  No anal canal mass or undescended testicle noted.  Small femoral hernia noted.  I chose 15x15 cm sheets of ultra-lightweight polypropylene mesh (Ultrapro), one for each side.  I cut a single sigmoid-shaped slit ~6cm from a corner of each mesh.  I placed the meshes into the preperitoneal space & laid them as overlapping diamonds such that at the inferior points, a 6x6 cm corner flap rested in the true anterolateral pelvis, covering the obturator & femoral foramina.   I allowed the bladder to  fall back and help tuck the corners of the mesh in.  The medial corners overlapped each other across midline cephalad to the pubic rim.   This provided >2 inch coverage around the hernia.  Because the defects well covered and not particularly large, I did not need tacks to hold the mesh in place  I held the hernia sacs cephalad & evacuated carbon dioxide.  I closed the fascia  With absorbable suture.  I closed the skin using 4-0 monocryl stitch.  Sterile dressings were applied. The patient was extubated & arrived in the PACU in stable condition..  I had discussed postoperative care with the patient in the holding area.   I did discuss operative findings and postoperative goals / instructions to his wife as well.  She expressed understanding & appreciation.  Instructions are written in the chart.  Adin Hector, M.D., F.A.C.S. Gastrointestinal and Minimally Invasive Surgery [facility] [address], [place], [place] [address] [phone] Main / Paging

## 2015-03-16 NOTE — Anesthesia Postprocedure Evaluation (Signed)
Anesthesia Post Note  Patient: Arthur Klein  Procedure(s) Performed: Procedure(s) (LRB): LAPAROSCOPIC REPAIR OF BILATERAL FEMEROL AND  BILATERAL INGUINAL HERNIAS WITH MESH (Bilateral) INSERTION OF MESH (Right) LAPAROSCOPIC LYSIS OF ADHESIONS  Patient location during evaluation: PACU Anesthesia Type: General Level of consciousness: awake and alert Pain management: pain level controlled Vital Signs Assessment: post-procedure vital signs reviewed and stable Respiratory status: spontaneous breathing Cardiovascular status: blood pressure returned to baseline Anesthetic complications: no    Last Vitals:  Filed Vitals:   03/16/15 1630 03/16/15 1638  BP: 144/70   Pulse: 109 69  Temp:    Resp: 17     Last Pain: There were no vitals filed for this visit.               Tiajuana Amass

## 2015-03-16 NOTE — Transfer of Care (Signed)
Immediate Anesthesia Transfer of Care Note  Patient: Arthur Klein  Procedure(s) Performed: Procedure(s): LAPAROSCOPIC REPAIR OF BILATERAL FEMEROL AND  BILATERAL INGUINAL HERNIAS WITH MESH (Bilateral) INSERTION OF MESH (Right) LAPAROSCOPIC LYSIS OF ADHESIONS  Patient Location: PACU  Anesthesia Type:General  Level of Consciousness:  sedated, patient cooperative and responds to stimulation  Airway & Oxygen Therapy:Patient Spontanous Breathing and Patient connected to face mask oxgen  Post-op Assessment:  Report given to PACU RN and Post -op Vital signs reviewed and stable  Post vital signs:  Reviewed and stable  Last Vitals:  Filed Vitals:   03/16/15 1001 03/16/15 1540  BP: 134/87 174/108  Pulse: 71 118  Temp: 36.4 C   Resp: 18     Complications: No apparent anesthesia complications

## 2015-03-19 ENCOUNTER — Encounter (HOSPITAL_COMMUNITY): Payer: Self-pay | Admitting: Surgery

## 2015-04-02 MED FILL — TRUEplus LANCETS 30G MISC: 90 days supply | Qty: 200 | Fill #0

## 2015-04-02 MED FILL — metFORMIN HCL 500 MG TABS: 500 | 90 days supply | Qty: 90 | Fill #0

## 2015-04-02 MED FILL — TRUE METRIX GLUCOSE TEST ST: 90 days supply | Qty: 200 | Fill #0

## 2015-04-24 ENCOUNTER — Other Ambulatory Visit: Payer: Self-pay

## 2015-04-24 VITALS — BP 120/80 | HR 78 | Resp 16 | Ht 68.5 in | Wt 213.0 lb

## 2015-04-24 DIAGNOSIS — E119 Type 2 diabetes mellitus without complications: Secondary | ICD-10-CM

## 2015-04-24 NOTE — Patient Outreach (Signed)
[facility]) Care Management   04/24/2015  Arthur Klein [birth year] [id number]  Arthur Klein is an 70 y.o. male.   Member seen for follow up office visit for Link to Wellness program for self management of Type 2 diabetes  Subjective: Member states that he is doing well and he has recovered from his hernia surgery in January.  States he has a new glucometer that he has not started to use yet but he would like a demonstration on how to use it.  States his blood sugars range from 94-115 in the morning.  States he is walking with his wife about 1 mile a day several days a week and he is still very active around his property and in his work shop.  States he is to see his MD on 05/08/15 for his physical.  Objective:   Review of Systems  All other systems reviewed and are negative. POC CBG- 102 3 hours post prandial  Physical Exam  Today's Vitals   04/24/15 0854  BP: 120/80  Pulse: 78  Resp: 16  Height: 1.74 m (5' 8.5")  Weight: 213 lb (96.616 kg)  SpO2: 97%  PainSc: 0-No pain    Current Medications:   Current Outpatient Prescriptions  Medication Sig Dispense Refill  . acetaminophen (TYLENOL) 500 MG tablet Take 500 mg by mouth every 6 (six) hours as needed for pain or fever.     Marland Kitchen amoxicillin (AMOXIL) 250 MG capsule Take 1,000 mg by mouth once as needed (prior to dental visits).    . B Complex Vitamins (VITAMIN B COMPLEX) TABS Take 1 tablet by mouth daily as needed (low energy).     . Cholecalciferol (VITAMIN D-3) 1000 UNITS CAPS Take 1 capsule by mouth daily.    . diphenhydrAMINE (BENADRYL) 12.5 MG/5ML elixir Take by mouth 4 (four) times daily as needed for allergies (takes appropriately 1/4 tsp).    . Multiple Vitamins-Minerals (ICAPS AREDS 2 PO) Take 1 capsule by mouth 2 (two) times daily.    . Nutritional Supplements (JUICE PLUS FIBRE PO) Take 2 capsules by mouth daily.    Marland Kitchen omega-3 fish oil (MAXEPA) 1000 MG CAPS capsule Take 1 capsule by mouth daily.     .  Probiotic Product (PROBIOTIC DAILY PO) Take 1 tablet by mouth daily.    Marland Kitchen HYDROcodone-acetaminophen (NORCO/VICODIN) 5-325 MG tablet Take 1-2 tablets by mouth every 4 (four) hours as needed for moderate pain or severe pain. (Patient not taking: Reported on 04/24/2015) 40 tablet 0   No current facility-administered medications for this visit.    Functional Status:   In your present state of health, do you have any difficulty performing the following activities: 04/24/2015 03/13/2015  Hearing? N N  Vision? N N  Difficulty concentrating or making decisions? N N  Walking or climbing stairs? N Y  Dressing or bathing? N N  Doing errands, shopping? N N  Preparing Food and eating ? - -  Using the Toilet? - -  In the past six months, have you accidently leaked urine? - -  Do you have problems with loss of bowel control? - -  Managing your Medications? - -  Managing your Finances? - -  Housekeeping or managing your Housekeeping? - -    Fall/Depression Screening:    PHQ 2/9 Scores 04/24/2015 10/24/2014  PHQ - 2 Score 0 0    Assessment:   Member seen for follow up office visit for Link to Wellness program for self management of Type 2  diabetes.  Member well controlled with last hemoglobin A1C 6.1 with no medications for diabetes. Reports fasting CBGs range 94-115. Member is up to date with eye exam and dental cleanings. Member is now walking with his wife several days a week for 1 mile and he is very active working around his home and work shop. Member is to see MD for annual physical on 05/08/15.  Plan:  Plan to continue to check blood sugar daily fasting and check 1  2 hours after eating  Goals of less than 100 fasting and less than 140 two hours after eating Plan to watch portions on ice cream snacks Plan to remain active with yard work and walk 3 days a week for 30 minutes Plan to keep appointment with Arthur Klein on 05/08/15       Plan to return to Link to Wellness on 10/09/15  at [place] Problem One        Most Recent Value   Care Plan Problem One  Potential for elevated blood sugars related to dx of diabetes   Role Documenting the Problem One  Care Management [place] for Problem One  Active   [facility] Long Term Goal (31-90 days)  Member will maintain hemoglobin A1C below 6.5 for the next 180 days [Member well controled with Hemoglobin A1C of 6.1]   [facility] Long Term Goal Start Date  04/24/15 [Last hemoglobin A1C 6.1]   Interventions for Problem One Long Term Goal  Reviewed CHO counting and portion conrtol, Reinforced to try to limit portion of ice cream to 1/2 cup, Instructed on how to use new True Metrix glucometer Encouraged to continue to be active and exercise daily, Reinforced to keep 05/08/15 appointment with provider,    Arthur Garter RN, [facility] Care Management Coordinator-Link to [place] Management [phone]

## 2015-04-24 NOTE — Patient Instructions (Signed)
Plan to continue to check blood sugar daily fasting and check 1  2 hours after eating  Goals of less than 100 fasting and less than 140 two hours after eating Plan to watch portions on ice cream snacks Plan to remain active with yard work and walk 3 days a week for 30 minutes Plan to keep appointment with Dr. Ardeth Perfect on 05/08/15       Plan to return to Link to Wellness on 10/09/15  at 9:30AM

## 2015-05-01 DIAGNOSIS — M109 Gout, unspecified: Secondary | ICD-10-CM | POA: Diagnosis not present

## 2015-05-01 DIAGNOSIS — Z0000 Encounter for general adult medical examination without abnormal findings: Secondary | ICD-10-CM | POA: Diagnosis not present

## 2015-05-01 DIAGNOSIS — E119 Type 2 diabetes mellitus without complications: Secondary | ICD-10-CM | POA: Diagnosis not present

## 2015-05-08 DIAGNOSIS — N3281 Overactive bladder: Secondary | ICD-10-CM | POA: Diagnosis not present

## 2015-05-08 DIAGNOSIS — E119 Type 2 diabetes mellitus without complications: Secondary | ICD-10-CM | POA: Diagnosis not present

## 2015-05-08 DIAGNOSIS — I10 Essential (primary) hypertension: Secondary | ICD-10-CM | POA: Diagnosis not present

## 2015-05-08 DIAGNOSIS — R3129 Other microscopic hematuria: Secondary | ICD-10-CM | POA: Diagnosis not present

## 2015-05-08 DIAGNOSIS — E784 Other hyperlipidemia: Secondary | ICD-10-CM | POA: Diagnosis not present

## 2015-05-08 DIAGNOSIS — Z1389 Encounter for screening for other disorder: Secondary | ICD-10-CM | POA: Diagnosis not present

## 2015-05-08 DIAGNOSIS — Z8546 Personal history of malignant neoplasm of prostate: Secondary | ICD-10-CM | POA: Diagnosis not present

## 2015-05-08 DIAGNOSIS — K4090 Unilateral inguinal hernia, without obstruction or gangrene, not specified as recurrent: Secondary | ICD-10-CM | POA: Diagnosis not present

## 2015-05-08 DIAGNOSIS — Z6830 Body mass index (BMI) 30.0-30.9, adult: Secondary | ICD-10-CM | POA: Diagnosis not present

## 2015-05-14 DIAGNOSIS — Z1211 Encounter for screening for malignant neoplasm of colon: Secondary | ICD-10-CM | POA: Diagnosis not present

## 2015-05-14 DIAGNOSIS — Z1212 Encounter for screening for malignant neoplasm of rectum: Secondary | ICD-10-CM | POA: Diagnosis not present

## 2015-05-30 MED FILL — CONTOUR NEXT EZ METER: W/DEVICE | 1 days supply | Qty: 1 | Fill #0

## 2015-05-31 MED FILL — MICROLET LANCETS: 50 days supply | Qty: 100 | Fill #0

## 2015-05-31 MED FILL — CONTOUR NEXT STRIPS: 50 days supply | Qty: 100 | Fill #0

## 2015-06-12 DIAGNOSIS — Z1211 Encounter for screening for malignant neoplasm of colon: Secondary | ICD-10-CM | POA: Diagnosis not present

## 2015-07-04 DIAGNOSIS — C61 Malignant neoplasm of prostate: Secondary | ICD-10-CM | POA: Diagnosis not present

## 2015-07-11 DIAGNOSIS — N5201 Erectile dysfunction due to arterial insufficiency: Secondary | ICD-10-CM | POA: Diagnosis not present

## 2015-07-11 DIAGNOSIS — C61 Malignant neoplasm of prostate: Secondary | ICD-10-CM | POA: Diagnosis not present

## 2015-07-11 DIAGNOSIS — Z0000 Encounter for general adult medical examination without abnormal findings: Secondary | ICD-10-CM | POA: Diagnosis not present

## 2015-07-11 DIAGNOSIS — N393 Stress incontinence (female) (male): Secondary | ICD-10-CM | POA: Diagnosis not present

## 2015-07-16 DIAGNOSIS — Z1211 Encounter for screening for malignant neoplasm of colon: Secondary | ICD-10-CM | POA: Diagnosis not present

## 2015-07-16 DIAGNOSIS — D123 Benign neoplasm of transverse colon: Secondary | ICD-10-CM | POA: Diagnosis not present

## 2015-07-16 DIAGNOSIS — K635 Polyp of colon: Secondary | ICD-10-CM | POA: Diagnosis not present

## 2015-07-16 DIAGNOSIS — D125 Benign neoplasm of sigmoid colon: Secondary | ICD-10-CM | POA: Diagnosis not present

## 2015-07-16 DIAGNOSIS — D122 Benign neoplasm of ascending colon: Secondary | ICD-10-CM | POA: Diagnosis not present

## 2015-07-16 DIAGNOSIS — R195 Other fecal abnormalities: Secondary | ICD-10-CM | POA: Diagnosis not present

## 2015-07-16 DIAGNOSIS — D374 Neoplasm of uncertain behavior of colon: Secondary | ICD-10-CM | POA: Diagnosis not present

## 2015-07-17 MED FILL — CONTOUR NEXT STRIPS: 50 days supply | Qty: 100 | Fill #1

## 2015-07-17 MED FILL — MICROLET LANCETS: 50 days supply | Qty: 100 | Fill #1

## 2015-07-17 MED FILL — AMOXICILLIN 500 MG CAPSULE: 500 | 5 days supply | Qty: 20 | Fill #0

## 2015-08-06 DIAGNOSIS — Z6831 Body mass index (BMI) 31.0-31.9, adult: Secondary | ICD-10-CM | POA: Diagnosis not present

## 2015-08-06 DIAGNOSIS — R0600 Dyspnea, unspecified: Secondary | ICD-10-CM | POA: Diagnosis not present

## 2015-08-06 DIAGNOSIS — I493 Ventricular premature depolarization: Secondary | ICD-10-CM | POA: Diagnosis not present

## 2015-08-06 DIAGNOSIS — R002 Palpitations: Secondary | ICD-10-CM | POA: Diagnosis not present

## 2015-08-13 ENCOUNTER — Telehealth: Payer: Self-pay | Admitting: Cardiovascular Disease

## 2015-08-13 NOTE — Telephone Encounter (Signed)
Received records from [facility] for appointment on 08/17/15 with Dr Gwenlyn Found.  Records given to [facility] (medical records) for Dr Kennon Holter schedule on 08/17/15. lp

## 2015-08-17 ENCOUNTER — Ambulatory Visit (INDEPENDENT_AMBULATORY_CARE_PROVIDER_SITE_OTHER): Payer: 59 | Admitting: Cardiovascular Disease

## 2015-08-17 ENCOUNTER — Telehealth (HOSPITAL_COMMUNITY): Payer: Self-pay

## 2015-08-17 ENCOUNTER — Encounter: Payer: Self-pay | Admitting: Cardiovascular Disease

## 2015-08-17 DIAGNOSIS — Z8249 Family history of ischemic heart disease and other diseases of the circulatory system: Secondary | ICD-10-CM

## 2015-08-17 DIAGNOSIS — I493 Ventricular premature depolarization: Secondary | ICD-10-CM | POA: Insufficient documentation

## 2015-08-17 NOTE — Progress Notes (Signed)
08/17/2015 [place]   [birth year]  [id number]  Primary Physician Velna Hatchet, MD Primary Cardiologist: Lorretta Harp MD Renae Gloss  HPI:  Mr. Rackley is a very pleasant 70 year old moderately overweight married Caucasian male father of 18, grandfather of 7 grandchildren and is accompanied by his wife He was also patient mine. He was referred by Dr. Shawna Clamp for cardiovascular evaluation because of PVCs. His only cardiovascular risk factor is notable for strong family history with multiple brothers have had stents as well as sister and mother. He has never had a heart attack or stroke. He denies chest pain or shortness of breath. He's had palpitations for approximately a year or more noticeable at night when he's lying down. He did have an event monitor late last year that showed PVCs with couplets and bigeminy. He denies caffeine or other stimulant intake.   Current Outpatient Prescriptions  Medication Sig Dispense Refill  . acetaminophen (TYLENOL) 500 MG tablet Take 500 mg by mouth every 6 (six) hours as needed for pain or fever.     Marland Kitchen amoxicillin (AMOXIL) 250 MG capsule Take 1,000 mg by mouth once as needed (prior to dental visits).    . B Complex Vitamins (VITAMIN B COMPLEX) TABS Take 1 tablet by mouth daily as needed (low energy).     Pleas Patricia CONTOUR NEXT TEST test strip   2  . BAYER MICROLET LANCETS lancets   2  . Blood Glucose Monitoring Suppl (CONTOUR NEXT EZ MONITOR) w/Device KIT   0  . Cholecalciferol (VITAMIN D-3) 1000 UNITS CAPS Take 1 capsule by mouth daily.    . diphenhydrAMINE (BENADRYL) 12.5 MG/5ML elixir Take by mouth 4 (four) times daily as needed for allergies (takes appropriately 1/4 tsp).    Marland Kitchen HYDROcodone-acetaminophen (NORCO/VICODIN) 5-325 MG tablet Take 1-2 tablets by mouth every 4 (four) hours as needed for moderate pain or severe pain. 40 tablet 0  . Multiple Vitamins-Minerals (ICAPS AREDS 2 PO) Take 1 capsule by mouth 2 (two) times daily.      . Nutritional Supplements (JUICE PLUS FIBRE PO) Take 2 capsules by mouth daily.    Marland Kitchen omega-3 fish oil (MAXEPA) 1000 MG CAPS capsule Take 1 capsule by mouth daily.     . Probiotic Product (PROBIOTIC DAILY PO) Take 1 tablet by mouth daily.     No current facility-administered medications for this visit.    Allergies  Allergen Reactions  . Erythrocin Diarrhea  . Lisinopril Cough  . Nsaids Other (See Comments)    Gi bleeding Has tolerated limited, small amounts of Meloxicam  . Oxycodone Other (See Comments)    Became a "wild person" and had total amnesia    Social History   Social History  . Marital Status: Married    Spouse Name: N/A  . Number of Children: N/A  . Years of Education: N/A   Occupational History  . Not on file.   Social History Main Topics  . Smoking status: Never Smoker   . Smokeless tobacco: Current User    Types: Chew     Comment: Patient declines materials to quit chewing tobacco.  . Alcohol Use: No  . Drug Use: No  . Sexual Activity: Yes   Other Topics Concern  . Not on file   Social History Narrative     Review of Systems: General: negative for chills, fever, night sweats or weight changes.  Cardiovascular: negative for chest pain, dyspnea on exertion, edema, orthopnea, palpitations, paroxysmal nocturnal dyspnea  or shortness of breath Dermatological: negative for rash Respiratory: negative for cough or wheezing Urologic: negative for hematuria Abdominal: negative for nausea, vomiting, diarrhea, bright red blood per rectum, melena, or hematemesis Neurologic: negative for visual changes, syncope, or dizziness All other systems reviewed and are otherwise negative except as noted above.    Blood pressure 136/79, pulse 80, height _0  (1.753 m), weight 208 lb 9.6 oz (94.62 kg).  General appearance: alert and no distress Neck: no adenopathy, no carotid bruit, no JVD, supple, symmetrical, trachea midline and thyroid not enlarged, symmetric, no  tenderness/mass/nodules Lungs: clear to auscultation bilaterally Heart: regular rate and rhythm, S1, S2 normal, no murmur, click, rub or gallop Extremities: extremities normal, atraumatic, no cyanosis or edema  EKG normal sinus rhythm at 80 with ventricular trigeminy. I personally reviewed this EKG  ASSESSMENT AND PLAN:   PVC's (premature ventricular contractions) Mr. Chasteen was referred for evaluation of PVCs. His cardiac risk factors are only notable for strong family history for heart disease. He did have an event monitor performed late last year that did show PVCs and couplets but no evidence of nonsustained ventricular tachycardia. He is fairly asymptomatic from these and usually feels them at night when lying down. He denies chest pain or shortness of breath. He is fairly active. He denies caffeine or other stimulant intake. He's noticed these palpitations for at least a year. I'm going to get a 2-D echo and an exercise Myoview stress test to rule out ischemic etiology.      Lorretta Harp MD FACP,FACC,FAHA, [facility] 08/17/2015 10:33 AM

## 2015-08-17 NOTE — Assessment & Plan Note (Signed)
Arthur Klein was referred for evaluation of PVCs. His cardiac risk factors are only notable for strong family history for heart disease. He did have an event monitor performed late last year that did show PVCs and couplets but no evidence of nonsustained ventricular tachycardia. He is fairly asymptomatic from these and usually feels them at night when lying down. He denies chest pain or shortness of breath. He is fairly active. He denies caffeine or other stimulant intake. He's noticed these palpitations for at least a year. I'm going to get a 2-D echo and an exercise Myoview stress test to rule out ischemic etiology.

## 2015-08-17 NOTE — Patient Instructions (Signed)
Medication Instructions:  Your physician recommends that you continue on your current medications as directed. Please refer to the Current Medication list given to you today.   Labwork: none  Testing/Procedures: Your physician has requested that you have an echocardiogram. Echocardiography is a painless test that uses sound waves to create images of your heart. It provides your doctor with information about the size and shape of your heart and how well your heart's chambers and valves are working. This procedure takes approximately one hour. There are no restrictions for this procedure.   Your physician has requested that you have a exercise myoview. For further information please visit [email or web address]. Please follow instruction sheet, as given.    Follow-Up: Pending result of testing with Dr. Gwenlyn Found.  Any Other Special Instructions Will Be Listed Below (If Applicable).     If you need a refill on your cardiac medications before your next appointment, please call your pharmacy.

## 2015-08-17 NOTE — Telephone Encounter (Signed)
Encounter complete. 

## 2015-08-22 ENCOUNTER — Ambulatory Visit (HOSPITAL_COMMUNITY)
Admission: RE | Admit: 2015-08-22 | Discharge: 2015-08-22 | Disposition: A | Discharge status: Home / Self Care | Payer: 59 | Source: Ambulatory Visit | Attending: Cardiovascular Disease | Admitting: Cardiovascular Disease

## 2015-08-22 DIAGNOSIS — R42 Dizziness and giddiness: Secondary | ICD-10-CM | POA: Diagnosis not present

## 2015-08-22 DIAGNOSIS — I119 Hypertensive heart disease without heart failure: Secondary | ICD-10-CM | POA: Diagnosis not present

## 2015-08-22 DIAGNOSIS — I493 Ventricular premature depolarization: Secondary | ICD-10-CM | POA: Insufficient documentation

## 2015-08-22 DIAGNOSIS — R0609 Other forms of dyspnea: Secondary | ICD-10-CM | POA: Insufficient documentation

## 2015-08-22 DIAGNOSIS — E663 Overweight: Secondary | ICD-10-CM | POA: Insufficient documentation

## 2015-08-22 DIAGNOSIS — R002 Palpitations: Secondary | ICD-10-CM | POA: Insufficient documentation

## 2015-08-22 DIAGNOSIS — Z6830 Body mass index (BMI) 30.0-30.9, adult: Secondary | ICD-10-CM | POA: Insufficient documentation

## 2015-08-22 DIAGNOSIS — E119 Type 2 diabetes mellitus without complications: Secondary | ICD-10-CM | POA: Insufficient documentation

## 2015-08-22 DIAGNOSIS — Z8249 Family history of ischemic heart disease and other diseases of the circulatory system: Secondary | ICD-10-CM | POA: Insufficient documentation

## 2015-08-22 LAB — MYOCARDIAL PERFUSION IMAGING
CHL CUP MPHR: 153 {beats}/min
CHL CUP NUCLEAR SDS: 1
CHL CUP NUCLEAR SRS: 0
CHL CUP NUCLEAR SSS: 1
CHL CUP RESTING HR STRESS: 65 {beats}/min
CSEPED: 6 min
CSEPEDS: 1 s
Estimated workload: 7 METS
LV sys vol: 82 mL
LVDIAVOL: 142 mL (ref 62–150)
Peak HR: 134 {beats}/min
Percent HR: 87 %
RPE: 17
TID: 1.12

## 2015-08-22 MED ORDER — TECHNETIUM TC 99M TETROFOSMIN IV KIT
30.2000 | PACK | Freq: Once | INTRAVENOUS | Status: AC | PRN
  Administered 2015-08-22: 30.2 via INTRAVENOUS
  Filled 2015-08-22: qty 30

## 2015-08-22 MED ORDER — TECHNETIUM TC 99M TETROFOSMIN IV KIT
10.3000 | PACK | Freq: Once | INTRAVENOUS | Status: AC | PRN
  Administered 2015-08-22: 10 via INTRAVENOUS
  Filled 2015-08-22: qty 10

## 2015-08-24 ENCOUNTER — Telehealth: Payer: Self-pay | Admitting: *Deleted

## 2015-08-24 NOTE — Telephone Encounter (Signed)
-----   Message from Lorretta Harp, MD sent at 08/23/2015 10:50 AM EDT ----- Normal perfusion, needs 2-D echo to further evaluate LV function

## 2015-08-24 NOTE — Telephone Encounter (Signed)
Spoke with pt, aware of results. Echo schedule 09-11-15.

## 2015-09-11 ENCOUNTER — Other Ambulatory Visit: Payer: Self-pay

## 2015-09-11 ENCOUNTER — Ambulatory Visit (HOSPITAL_COMMUNITY): Payer: 59 | Attending: Cardiology

## 2015-09-11 DIAGNOSIS — I493 Ventricular premature depolarization: Secondary | ICD-10-CM | POA: Diagnosis not present

## 2015-09-11 DIAGNOSIS — I517 Cardiomegaly: Secondary | ICD-10-CM | POA: Diagnosis not present

## 2015-09-11 DIAGNOSIS — I340 Nonrheumatic mitral (valve) insufficiency: Secondary | ICD-10-CM | POA: Diagnosis not present

## 2015-09-11 DIAGNOSIS — I351 Nonrheumatic aortic (valve) insufficiency: Secondary | ICD-10-CM | POA: Insufficient documentation

## 2015-09-11 DIAGNOSIS — E669 Obesity, unspecified: Secondary | ICD-10-CM | POA: Insufficient documentation

## 2015-09-11 DIAGNOSIS — Z6830 Body mass index (BMI) 30.0-30.9, adult: Secondary | ICD-10-CM | POA: Insufficient documentation

## 2015-09-11 DIAGNOSIS — Z8249 Family history of ischemic heart disease and other diseases of the circulatory system: Secondary | ICD-10-CM | POA: Diagnosis not present

## 2015-09-14 ENCOUNTER — Telehealth: Payer: Self-pay | Admitting: Cardiovascular Disease

## 2015-09-14 DIAGNOSIS — H524 Presbyopia: Secondary | ICD-10-CM | POA: Diagnosis not present

## 2015-09-14 DIAGNOSIS — H5203 Hypermetropia, bilateral: Secondary | ICD-10-CM | POA: Diagnosis not present

## 2015-09-14 NOTE — Telephone Encounter (Signed)
New Message  Pt call requesting to speak to Rn about Result from Test. Please call back to disucss

## 2015-09-14 NOTE — Telephone Encounter (Signed)
Patient called in requesting echo results. Informed him [place] and he will be notified as soon as the report is reviewed. He voiced understanding.

## 2015-09-19 ENCOUNTER — Telehealth: Payer: Self-pay | Admitting: Cardiovascular Disease

## 2015-09-19 NOTE — Telephone Encounter (Signed)
New message    Pt had an Echo last week and would like to know the results of it. Please call.

## 2015-09-19 NOTE — Telephone Encounter (Signed)
The echocardiogram showed that her ejection fraction and what was suggested on the Myoview stress test.

## 2015-09-19 NOTE — Telephone Encounter (Signed)
Pt had Echo performed 7/11 and is calling to discuss results.  I will forward this message to Dr Gwenlyn Found to finalize the results of this test before contacting the pt.

## 2015-09-19 NOTE — Telephone Encounter (Signed)
I spoke with the pt and made him aware of EF per echocardiogram 50-55% and this was better than what was suggested on myoview. The pt continues to have issues with palpitations and he is wondering what can be done to help decrease the frequency.  I spoke with him about limiting caffeine and nicotine consumption, managing stress and remaining well hydrated.  The pt does use chewing tobacco but he is not interested in stopping.  I made the pt aware that medications may be an option but he is not interested in pursuing this further at this time. The pt will contact the office with any other questions or concerns.

## 2015-10-09 ENCOUNTER — Other Ambulatory Visit: Payer: Self-pay

## 2015-10-09 DIAGNOSIS — E119 Type 2 diabetes mellitus without complications: Secondary | ICD-10-CM

## 2015-10-09 NOTE — Patient Outreach (Signed)
[facility]) Care Management   10/09/2015  Arthur Klein 10-06-1945 [id number]  Arthur Klein is an 70 y.o. male.   Member seen for follow up office visit for Link to Wellness program for self management of Type 2 diabetes   Subjective: Member states that he is trying to watch his ice cream portions at night but sometimes he overdoes it.  States that he has been having heart palpitations that he feels at night.  States he has seen the cardiologist and had a stress test.  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  States he has not been walking as much with the hot weather.  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    Objective:   Review of Systems  Cardiovascular: Positive for palpitations.  All other systems reviewed and are negative.   Physical Exam  Cardiovascular: A regularly irregular rhythm present.   Today's Vitals   10/09/15 0914  BP: 134/80  Pulse: (!) 52  Resp: 16  SpO2: 96%  Weight: 217 lb (98.4 kg)  Height: 1.74 m (5' 8.5")  PainSc: 0-No pain    Encounter Medications:   Outpatient Encounter Prescriptions as of 10/09/2015  Medication Sig Note  . acetaminophen (TYLENOL) 500 MG tablet Take 500 mg by mouth every 6 (six) hours as needed for pain or fever.    Marland Kitchen amoxicillin (AMOXIL) 250 MG capsule Take 1,000 mg by mouth once as needed (prior to dental visits).   . B Complex Vitamins (VITAMIN B COMPLEX) TABS Take 1 tablet by mouth daily as needed (low energy).    Pleas Patricia CONTOUR NEXT TEST test strip  08/17/2015: Received from: External Pharmacy  . BAYER MICROLET LANCETS lancets  08/17/2015: Received from: External Pharmacy  . Blood Glucose Monitoring Suppl (CONTOUR NEXT EZ MONITOR) w/Device KIT  08/17/2015: Received from: External Pharmacy  . Cholecalciferol (VITAMIN D-3) 1000 UNITS CAPS Take 1 capsule by mouth daily.   . diphenhydrAMINE (BENADRYL) 12.5 MG/5ML elixir Take by mouth 4 (four) times daily as needed for allergies (takes  appropriately 1/4 tsp).   . Multiple Vitamins-Minerals (ICAPS AREDS 2 PO) Take 1 capsule by mouth 2 (two) times daily.   . Nutritional Supplements (JUICE PLUS FIBRE PO) Take 2 capsules by mouth daily.   Marland Kitchen omega-3 fish oil (MAXEPA) 1000 MG CAPS capsule Take 1 capsule by mouth daily.    . Probiotic Product (PROBIOTIC DAILY PO) Take 1 tablet by mouth daily.   Marland Kitchen HYDROcodone-acetaminophen (NORCO/VICODIN) 5-325 MG tablet Take 1-2 tablets by mouth every 4 (four) hours as needed for moderate pain or severe pain. (Patient not taking: Reported on 10/09/2015)    No facility-administered encounter medications on file as of 10/09/2015.     Functional Status:   In your present state of health, do you have any difficulty performing the following activities: 10/09/2015 04/24/2015  Hearing? N N  Vision? N N  Difficulty concentrating or making decisions? N N  Walking or climbing stairs? N N  Dressing or bathing? N N  Doing errands, shopping? - Scientist, forensic and eating ? - -  Using the Toilet? - -  In the past six months, have you accidently leaked urine? - -  Do you have problems with loss of bowel control? - -  Managing your Medications? - -  Managing your Finances? - -  Housekeeping or managing your Housekeeping? - -  Some recent data might be hidden    Fall/Depression Screening:  PHQ 2/9 Scores 10/09/2015 04/24/2015 10/24/2014  PHQ - 2 Score 0 0 0    Assessment:  Member seen for follow up office visit for Link to Wellness program for self management of Type 2 diabetes.  Member well controlled with last hemoglobin A1C 5.9% with no medications for diabetes. Reports fasting CBGs range 117-133. Member is up to date with eye exam and dental cleanings. Member is now walking with his wife several days a week for 1 mile and he is very active working around his home and work shop. 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  No medications for his PVCs  Plan:   Plan to continue to check blood sugar daily  fasting and check 1  2 hours after eating  Goals of less than 100 fasting and less than 140 two hours after eating Plan to watch portions on ice cream snacks Plan to remain active with yard work and walk 3 days a week for 30 minutes Plan to keep appointment with Dr. Ardeth Perfect on September Plan complete EMMI programs by 01/02/16       Plan to return to Link to Wellness on 04/08/16 at [place] Problem One   Flowsheet Row Most Recent Value  Care Plan Problem One  Potential for elevated blood sugars related to dx of diabetes  Role Documenting the Problem One  Care Management Coordinator  Care Plan for Problem One  Active  [facility] Long Term Goal (31-90 days)  Member will maintain hemoglobin A1C below 6.5 for the next 180 days [Member well controled with Hemoglobin A1C of 6.1]  THN Long Term Goal Start Date  10/09/15 [Last hemoglobin A1C 6.1]  Interventions for Problem One Long Term Goal  Reviewed CHO counting and portion control, Reinforced to try to limit portion of ice cream to 1/2 cup, Instructed to try to eat more protein with his meals and snacks, Reviewed good sources of protein, Encouraged to continue to be active and exercise daily, Reinforced to keep September appointment with provider,    [facility] CM Care Plan Problem Two   Flowsheet Row Most Recent Value  Care Plan Problem Two  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 as evidenced by pt verbalization of liittle understanding of PVCs  Role Documenting the Problem Two  Care Management Coordinator  Care Plan for Problem Two  Active  [facility] CM Short Term Goal #1 (0-30 days)  Member will verbalize knowledge of PVC and how to reduce them  [facility] CM Short Term Goal #1 Start Date  10/09/15  [facility] CM Short Term Goal #1 Met Date   10/09/15  Interventions for Short Term Goal #2   Instructed member using teach back method on what PVCs are and how to help reduce their frequencly, Given carenote written instructions  on PVC's, Instructed member to try  to cut back on his chewing tobacco and caffiene      [name] RN, [facility] Care Management Coordinator-Link to [place] Management [phone]

## 2015-10-09 NOTE — Patient Instructions (Addendum)
Premature Ventricular Contraction A premature ventricular contraction is an irregularity in the normal heart rhythm. These contractions are extra heartbeats that occur too early in the normal sequence. In most cases, these contractions are harmless and do not require treatment. CAUSES Premature ventricular contractions may occur without a known cause. In healthy people, the extra contractions may be caused by:  Smoking.  Drinking alcohol.  Caffeine.  Certain medicines.  Some illegal drugs.  Stress. Sometimes, changes in chemicals in the blood (electrolytes) can also cause premature ventricular contractions. They can also occur in people with heart diseases that cause a decrease in blood flow to the heart. SIGNS AND SYMPTOMS Premature ventricular contractions often do not cause any symptoms. In some cases, you may have a feeling of your heart beating fast or skipping a beat (palpitations). DIAGNOSIS Your health care provider will take your medical history and do a physical exam. During the exam, the health care provider will check for irregular heartbeats. Various tests may be done to help diagnose premature ventricular contractions. These tests may include:  An ECG (electrocardiogram) to monitor the electrical activity of your heart.  Holter monitor testing. A Holter monitor is a portable device that can monitor the electrical activity of your heart over longer periods of time.  Stress tests to see how exercise affects your heart rhythm.  Echocardiogram. This test uses sound waves (ultrasound) to produce an image of your heart.  Electrophysiology study. This is used to evaluate the electrical conduction system of your heart. TREATMENT Usually, no treatment is needed. You may be advised to avoid things that can trigger the premature contractions, such as caffeine or alcohol. Medicines are sometimes given if symptoms are severe or if the extra heartbeats are very frequent. Treatment may  also be needed for an underlying cause of the contractions if one is found. HOME CARE INSTRUCTIONS  Take medicines only as directed by your health care provider.  Make any lifestyle changes recommended by your health care provider. These may include:  Quitting smoking.  Avoiding or limiting caffeine or alcohol.  Exercising. Talk to your health care provider about what type of exercise is safe for you.  Trying to reduce stress.  Keep all follow-up visits with your health care provider. This is important. SEEK IMMEDIATE MEDICAL CARE IF:  You feel palpitations that are frequent or continual.  You have chest pain.  You have shortness of breath.  You have sweating for no reason.  You have nausea and vomiting.  You become light-headed or faint.   This information is not intended to replace advice given to you by your health care provider. Make sure you discuss any questions you have with your health care provider.   Document Released: 10/05/2003 Document Revised: 03/10/2014 Document Reviewed: 07/21/2013 Elsevier Interactive Patient Education 2016 Elsevier Inc.  

## 2015-10-10 MED FILL — CONTOUR NEXT STRIPS: 50 days supply | Qty: 100 | Fill #2

## 2015-10-10 MED FILL — MICROLET LANCETS: 50 days supply | Qty: 100 | Fill #2

## 2015-10-10 MED FILL — AMOXICILLIN 500 MG CAPSULE: 500 | 5 days supply | Qty: 20 | Fill #1

## 2015-11-13 DIAGNOSIS — Z8546 Personal history of malignant neoplasm of prostate: Secondary | ICD-10-CM | POA: Diagnosis not present

## 2015-11-13 DIAGNOSIS — E119 Type 2 diabetes mellitus without complications: Secondary | ICD-10-CM | POA: Diagnosis not present

## 2015-11-13 DIAGNOSIS — I10 Essential (primary) hypertension: Secondary | ICD-10-CM | POA: Diagnosis not present

## 2015-11-13 DIAGNOSIS — Z6832 Body mass index (BMI) 32.0-32.9, adult: Secondary | ICD-10-CM | POA: Diagnosis not present

## 2015-11-13 DIAGNOSIS — E784 Other hyperlipidemia: Secondary | ICD-10-CM | POA: Diagnosis not present

## 2016-01-02 DIAGNOSIS — R3121 Asymptomatic microscopic hematuria: Secondary | ICD-10-CM | POA: Diagnosis not present

## 2016-01-02 DIAGNOSIS — N5201 Erectile dysfunction due to arterial insufficiency: Secondary | ICD-10-CM | POA: Diagnosis not present

## 2016-01-02 DIAGNOSIS — Z8546 Personal history of malignant neoplasm of prostate: Secondary | ICD-10-CM | POA: Diagnosis not present

## 2016-01-03 DIAGNOSIS — R3121 Asymptomatic microscopic hematuria: Secondary | ICD-10-CM | POA: Diagnosis not present

## 2016-01-03 DIAGNOSIS — N281 Cyst of kidney, acquired: Secondary | ICD-10-CM | POA: Diagnosis not present

## 2016-01-15 MED FILL — AMOXICILLIN 500 MG CAPSULE: 500 | 5 days supply | Qty: 20 | Fill #2

## 2016-01-16 MED FILL — CONTOUR NEXT STRIPS: 50 days supply | Qty: 100 | Fill #0

## 2016-01-16 MED FILL — MICROLET LANCETS: 50 days supply | Qty: 100 | Fill #0

## 2016-01-29 ENCOUNTER — Other Ambulatory Visit: Payer: Self-pay | Admitting: Urology

## 2016-01-30 ENCOUNTER — Other Ambulatory Visit: Payer: Self-pay | Admitting: Urology

## 2016-02-14 NOTE — Progress Notes (Signed)
lov dr berry 6/17, stress 6/17,holter monitor 10/16, eccho 7/17  ALL IN EPIC

## 2016-02-14 NOTE — Progress Notes (Signed)
ekg 6/17 epic

## 2016-02-14 NOTE — Patient Instructions (Addendum)
Arthur Klein  02/14/2016   Your procedure is scheduled on: 02/18/16  Report to [facility] Main  Entrance take [facility]  elevators to 3rd floor to  Arthur Klein at 115  PM.  Call this number if you have problems the morning of surgery [phone]   Remember: ONLY 1 PERSON MAY GO WITH YOU TO SHORT STAY TO GET  READY MORNING OF Arthur Klein.  Do not eat food:After Midnight.-- MAY HAVE CLEAR LIQUIDS MORNING OF SURGERY UNTIL  0715 AM-- THEN NOTHING BY MOUTH     Take these medicines the morning of surgery with A SIP OF WATER: NONE DO NOT TAKE ANY DIABETIC MEDICATIONS DAY OF YOUR SURGERY                               You may not have any metal on your body including hair pins and              piercings  Do not wear jewelry, make-up, lotions, powders or perfumes, deodorant             Do not wear nail polish.  Do not shave  48 hours prior to surgery.              Men may shave face and neck.   Do not bring valuables to the hospital. Arthur Klein.  Contacts, dentures or bridgework may not be worn into surgery.  Leave suitcase in the car. After surgery it may be brought to your room.     Patients discharged the day of surgery will not be allowed to drive home.  Name and phone number of your driver:  Special Instructions: N/A              Please read over the following fact sheets you were given: _____________________________________________________________________             How to Manage Your Diabetes Before and After Surgery  Why is it important to control my blood sugar before and after surgery? . Improving blood sugar levels before and after surgery helps healing and can limit problems. . A way of improving blood sugar control is eating a healthy diet by: o  Eating less sugar and carbohydrates o  Increasing activity/exercise o  Talking with your doctor about reaching your blood sugar goals . High blood  sugars (greater than 180 mg/dL) can raise your risk of infections and slow your recovery, so you will need to focus on controlling your diabetes during the weeks before surgery. . Make sure that the doctor who takes care of your diabetes knows about your planned surgery including the date and location.  How do I manage my blood sugar before surgery? . Check your blood sugar at least 4 times a day, starting 2 days before surgery, to make sure that the level is not too high or low. o Check your blood sugar the morning of your surgery when you wake up and every 2 hours until you get to the Short Stay unit. . If your blood sugar is less than 70 mg/dL, you will need to treat for low blood sugar: o Do not take insulin. o Treat a low blood sugar (less than 70 mg/dL) with  cup  of clear juice (cranberry or apple), 4 glucose tablets, OR glucose gel. o Recheck blood sugar in 15 minutes after treatment (to make sure it is greater than 70 mg/dL). If your blood sugar is not greater than 70 mg/dL on recheck, call [phone] for further instructions. . Report your blood sugar to the short stay nurse when you get to Short Stay.  . If you are admitted to the hospital after surgery: o Your blood sugar will be checked by the staff and you will probably be given insulin after surgery (instead of oral diabetes medicines) to make sure you have good blood sugar levels. o The goal for blood sugar control after surgery is 80-180 mg/dL.   WHAT DO I DO ABOUT MY DIABETES MEDICATION?     Patient Signature:  Date:   Nurse Signature:  Date:   Reviewed and Endorsed by [facility] Patient Education Committee, August 2015[facility] - Preparing for Surgery Before surgery, you can play an important role.  Because skin is not sterile, your skin needs to be as free of germs as possible.  You can reduce the number of germs on your skin by washing with CHG (chlorahexidine gluconate) soap before surgery.  CHG is an antiseptic  cleaner which kills germs and bonds with the skin to continue killing germs even after washing. Please DO NOT use if you have an allergy to CHG or antibacterial soaps.  If your skin becomes reddened/irritated stop using the CHG and inform your nurse when you arrive at Short Stay. Do not shave (including legs and underarms) for at least 48 hours prior to the first CHG shower.  You may shave your face/neck. Please follow these instructions carefully:  1.  Shower with CHG Soap the night before surgery and the  morning of Surgery.  2.  If you choose to wash your hair, wash your hair first as usual with your  normal  shampoo.  3.  After you shampoo, rinse your hair and body thoroughly to remove the  shampoo.                           4.  Use CHG as you would any other liquid soap.  You can apply chg directly  to the skin and wash                       Gently with a scrungie or clean washcloth.  5.  Apply the CHG Soap to your body ONLY FROM THE NECK DOWN.   Do not use on face/ open                           Wound or open sores. Avoid contact with eyes, ears mouth and genitals (private parts).                       Wash face,  Genitals (private parts) with your normal soap.             6.  Wash thoroughly, paying special attention to the area where your surgery  will be performed.  7.  Thoroughly rinse your body with warm water from the neck down.  8.  DO NOT shower/wash with your normal soap after using and rinsing off  the CHG Soap.                9.  Arthur Klein  yourself dry with a clean towel.            10.  Wear clean pajamas.            11.  Place clean sheets on your bed the night of your first shower and do not  sleep with pets. Day of Surgery : Do not apply any lotions/deodorants the morning of surgery.  Please wear clean clothes to the hospital/surgery center.  FAILURE TO FOLLOW THESE INSTRUCTIONS MAY RESULT IN THE CANCELLATION OF YOUR SURGERY PATIENT  SIGNATURE_________________________________  NURSE SIGNATURE__________________________________  ________________________________________________________________________    CLEAR LIQUID DIET   Foods Allowed                                                                     Foods Excluded  Coffee and tea, regular and decaf                             liquids that you cannot  Plain Jell-O in any flavor                                             see through such as: Fruit ices (not with fruit pulp)                                     milk, soups, orange juice  Iced Popsicles                                    All solid food Carbonated beverages, regular and diet                                    Cranberry, grape and apple juices Sports drinks like Gatorade Lightly seasoned clear broth or consume(fat free) Sugar, honey syrup  Sample Menu Breakfast                                Lunch                                     Supper Cranberry juice                    Beef broth                            Chicken broth Jell-O                                     Grape juice  Apple juice Coffee or tea                        Jell-O                                      Popsicle                                                Coffee or tea                        Coffee or tea  _____________________________________________________________________                 Arthur Klein  02/15/2016   Your procedure is scheduled on: 02/18/2016    Report to [facility] Main  Entrance take [facility]  elevators to 3rd floor to  [place] at   115pm  Call this number if you have problems the morning of surgery [phone]   Remember: ONLY 1 PERSON MAY GO WITH YOU TO SHORT STAY TO GET  READY MORNING OF YOUR SURGERY.  Do not eat food after midnite. May have clear liquids from 12 midnite until 0830am morning of surgery then nothing by mouth.     CLEAR LIQUID  DIET   Foods Allowed                                                                     Foods Excluded  Coffee and tea, regular and decaf                             liquids that you cannot  Plain Jell-O in any flavor                                             see through such as: Fruit ices (not with fruit pulp)                                     milk, soups, orange juice  Iced Popsicles                                    All solid food Carbonated beverages, regular and diet                                    Cranberry, grape and apple juices Sports drinks like Gatorade Lightly seasoned clear broth or consume(fat free) Sugar, honey syrup  Sample Menu Breakfast  Lunch                                     Supper Cranberry juice                    Beef broth                            Chicken broth Jell-O                                     Grape juice                           Apple juice Coffee or tea                        Jell-O                                      Popsicle                                                Coffee or tea                        Coffee or tea  _____________________________________________________________________       Take these medicines the morning of surgery with A SIP OF WATER: none                                 You may not have any metal on your body including hair pins and              piercings  Do not wear jewelry, lotions, powders or perfumes, deodorant            .              Men may shave face and neck.   Do not bring valuables to the hospital. [place].  Contacts, dentures or bridgework may not be worn into surgery.  Leave suitcase in the car. After surgery it may be brought to your room.       Special Instructions: N/A              Please read over the following fact sheets you were  given: _____________________________________________________________________             [facility] - Preparing for Surgery Before surgery, you can play an important role.  Because skin is not sterile, your skin needs to be as free of germs as possible.  You can reduce the number of germs on your skin by washing with CHG (chlorahexidine gluconate) soap before surgery.  CHG is an antiseptic cleaner which kills germs and bonds with the skin to continue killing germs even after washing. Please DO NOT use if you have  an allergy to CHG or antibacterial soaps.  If your skin becomes reddened/irritated stop using the CHG and inform your nurse when you arrive at Short Stay. Do not shave (including legs and underarms) for at least 48 hours prior to the first CHG shower.  You may shave your face/neck. Please follow these instructions carefully:  1.  Shower with CHG Soap the night before surgery and the  morning of Surgery.  2.  If you choose to wash your hair, wash your hair first as usual with your  normal  shampoo.  3.  After you shampoo, rinse your hair and body thoroughly to remove the  shampoo.                           4.  Use CHG as you would any other liquid soap.  You can apply chg directly  to the skin and wash                       Gently with a scrungie or clean washcloth.  5.  Apply the CHG Soap to your body ONLY FROM THE NECK DOWN.   Do not use on face/ open                           Wound or open sores. Avoid contact with eyes, ears mouth and genitals (private parts).                       Wash face,  Genitals (private parts) with your normal soap.             6.  Wash thoroughly, paying special attention to the area where your surgery  will be performed.  7.  Thoroughly rinse your body with warm water from the neck down.  8.  DO NOT shower/wash with your normal soap after using and rinsing off  the CHG Soap.                9.  Pat yourself dry with a clean towel.            10.  Wear  clean pajamas.            11.  Place clean sheets on your bed the night of your first shower and do not  sleep with pets. Day of Surgery : Do not apply any lotions/deodorants the morning of surgery.  Please wear clean clothes to the hospital/surgery center.  FAILURE TO FOLLOW THESE INSTRUCTIONS MAY RESULT IN THE CANCELLATION OF YOUR SURGERY PATIENT SIGNATURE_________________________________  NURSE SIGNATURE__________________________________  ________________________________________________________________________

## 2016-02-15 ENCOUNTER — Encounter (HOSPITAL_COMMUNITY): Payer: Self-pay

## 2016-02-15 ENCOUNTER — Other Ambulatory Visit: Payer: Self-pay

## 2016-02-15 ENCOUNTER — Encounter (HOSPITAL_COMMUNITY)
Admission: RE | Admit: 2016-02-15 | Discharge: 2016-02-15 | Disposition: A | Discharge status: Home / Self Care | Payer: 59 | Source: Ambulatory Visit | Attending: Urology | Admitting: Urology

## 2016-02-15 DIAGNOSIS — M1990 Unspecified osteoarthritis, unspecified site: Secondary | ICD-10-CM | POA: Diagnosis not present

## 2016-02-15 DIAGNOSIS — K219 Gastro-esophageal reflux disease without esophagitis: Secondary | ICD-10-CM | POA: Diagnosis not present

## 2016-02-15 DIAGNOSIS — I10 Essential (primary) hypertension: Secondary | ICD-10-CM | POA: Diagnosis not present

## 2016-02-15 DIAGNOSIS — Z888 Allergy status to other drugs, medicaments and biological substances status: Secondary | ICD-10-CM | POA: Diagnosis not present

## 2016-02-15 DIAGNOSIS — N201 Calculus of ureter: Secondary | ICD-10-CM | POA: Diagnosis not present

## 2016-02-15 DIAGNOSIS — R3915 Urgency of urination: Secondary | ICD-10-CM | POA: Diagnosis not present

## 2016-02-15 DIAGNOSIS — N529 Male erectile dysfunction, unspecified: Secondary | ICD-10-CM | POA: Diagnosis not present

## 2016-02-15 DIAGNOSIS — Z885 Allergy status to narcotic agent status: Secondary | ICD-10-CM | POA: Diagnosis not present

## 2016-02-15 DIAGNOSIS — Z8546 Personal history of malignant neoplasm of prostate: Secondary | ICD-10-CM | POA: Diagnosis not present

## 2016-02-15 DIAGNOSIS — E119 Type 2 diabetes mellitus without complications: Secondary | ICD-10-CM | POA: Diagnosis not present

## 2016-02-15 HISTORY — DX: Personal history of urinary calculi: Z87.442

## 2016-02-15 LAB — CBC
HCT: 40.8 % (ref 39.0–52.0)
HEMOGLOBIN: 13.9 g/dL (ref 13.0–17.0)
MCH: 30.2 pg (ref 26.0–34.0)
MCHC: 34.1 g/dL (ref 30.0–36.0)
MCV: 88.5 fL (ref 78.0–100.0)
PLATELETS: 144 10*3/uL — AB (ref 150–400)
RBC: 4.61 MIL/uL (ref 4.22–5.81)
RDW: 13.7 % (ref 11.5–15.5)
WBC: 6 10*3/uL (ref 4.0–10.5)

## 2016-02-15 LAB — BASIC METABOLIC PANEL
ANION GAP: 8 (ref 5–15)
BUN: 16 mg/dL (ref 6–20)
CALCIUM: 9.2 mg/dL (ref 8.9–10.3)
CO2: 26 mmol/L (ref 22–32)
CREATININE: 0.76 mg/dL (ref 0.61–1.24)
Chloride: 106 mmol/L (ref 101–111)
GFR calc Af Amer: >60 mL/min (ref >60)
GLUCOSE: 117 mg/dL — AB (ref 65–99)
Potassium: 3.8 mmol/L (ref 3.5–5.1)
Sodium: 140 mmol/L (ref 135–145)

## 2016-02-15 LAB — GLUCOSE, CAPILLARY: GLUCOSE-CAPILLARY: 127 mg/dL — AB (ref 65–99)

## 2016-02-15 NOTE — Progress Notes (Signed)
fINAL ekg DONE 02/15/16- epic

## 2016-02-15 NOTE — Progress Notes (Signed)
Pulse 51 at preop .  Patient voices no complaints.  EKG done rate of 80 with frequent pvc's which patient has a hx of with EKG of 08/17/15- in epic and on chart shows pulse in 80's with frequent pvc's.  Patient seen by cardiologist in 08/2015 with stress and echo done. Stress, echo and LOV with cardiology in epic and on chart.

## 2016-02-15 NOTE — H&P (Signed)
Left ureteral stone  He has denied gross hematuria but does admit to a vague, mild to moderate left-sided flank pain that has occurred over the past few months. He has denied a history of urolithiasis, urothelial malignancy, family history of GU malignancy, recent GU trauma, or other associated symptoms. He denies any dysuria or fever. He denies any nausea or vomiting.  He was eventually found to have a small proximal left ureteral stone.    ALLERGIES: Lisinopril TABS NSAIDs OxyCODONE HCl TABS    MEDICATIONS: B Complex CAPS Oral  Cialis 5 MG Oral Tablet 0 Oral  Multi-Vitamin TABS Oral  Probiotic CAPS Oral  Pyridium 200 MG Oral Tablet 1 Oral Every 6 hours  Viagra 100 MG Oral Tablet 1 Oral As Directed  Vitamin D 1000 UNIT Oral Tablet Oral     GU PSH: Hernia Repair - 2014 Robotic Radical Prostatectomy - 2014      PSH Notes: Prostatect Retropubic Radical W/ Nerve Sparing Laparoscopic, Biopsy Lymph Node, Knee Replacement, Hernia Repair, Shoulder Arthroscopy With Rotator Cuff Repair   NON-GU PSH: Arthroscop Rotator Cuff Repr - 2014 Biopsy/remove Lymph Nodes - 2014 Revise Knee Joint - 2014    GU PMH: ED, arterial insufficiency, Erectile dysfunction due to arterial insufficiency - 06/26/2015 Prostate Cancer, Prostate cancer - 06/26/2015 Urinary Urgency, Urinary urgency - 2016 Urinary Tract Inf, Unspec site, Urinary tract infection - 2015 Dysuria, Dysuria - 2015      PMH Notes:   1) Prostate cancer: He is s/p a BNS RAL radical prostatectomy on 11/04/12. His PSA has been undetectable since surgery.   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   3) Erectile dysfunction: His pretreatment SHIM was 24.     NON-GU PMH: Diabetes Type 2 GERD Hypertension    FAMILY HISTORY: Carcinoma Of The Pancreas - Brother Congestive Heart Failure - Mother Diabetes -  Mother, Brother Hypertension - Brother, Mother Prostate Cancer - Brother Stroke Syndrome - Brother   SOCIAL HISTORY: None    Notes: Tobacco use, Exercise Habits, Activities Of Daily Living, Self-reliant In Usual Daily Activities, Living Independently With Spouse, Alcohol Use, Marital History - Currently Married, Never A Smoker   REVIEW OF SYSTEMS:    GU Review Male:   Patient denies frequent urination, hard to postpone urination, burning/ pain with urination, get up at night to urinate, leakage of urine, stream starts and stops, trouble starting your streams, and have to strain to urinate .  Gastrointestinal (Lower):   Patient denies diarrhea and constipation.  Gastrointestinal (Upper):   Patient denies nausea and vomiting.  Constitutional:   Patient denies fever, night sweats, weight loss, and fatigue.  Skin:   Patient denies skin rash/ lesion and itching.  Eyes:   Patient denies blurred vision and double vision.  Ears/ Nose/ Throat:   Patient denies sore throat and sinus problems.  Hematologic/Lymphatic:   Patient denies swollen glands and easy bruising.  Cardiovascular:   Patient denies chest pains and leg swelling.  Respiratory:   Patient denies cough and shortness of breath.  Endocrine:   Patient denies excessive thirst.  Musculoskeletal:   Patient reports back pain. Patient denies joint pain.  Neurological:   Patient denies headaches and dizziness.  Psychologic:   Patient denies depression and anxiety.     MULTI-SYSTEM PHYSICAL EXAMINATION:    Constitutional: Well-nourished. No physical deformities. Normally developed. Good grooming.  Neck: Neck  symmetrical, not swollen. Normal tracheal position.  Respiratory: No labored breathing, no use of accessory muscles.   Cardiovascular: Normal temperature, normal extremity pulses, no swelling, no varicosities.  Lymphatic: No enlargement of neck, axillae, groin.  Skin: No paleness, no jaundice, no cyanosis. No lesion, no ulcer, no rash.   Neurologic / Psychiatric: Oriented to time, oriented to place, oriented to person. No depression, no anxiety, no agitation.  Gastrointestinal: No masses. Mild left-sided CVA tenderness.  Eyes: Normal conjunctivae. Normal eyelids.  Ears, Nose, Mouth, and Throat: Left ear no scars, no lesions, no masses. Right ear no scars, no lesions, no masses. Nose no scars, no lesions, no masses. Normal hearing. Normal lips.  Musculoskeletal: Normal gait and station of head and neck.         I spoke with Mr. and Mrs. Salminen regarding his CT findings indicating a 2 mm left proximal ureteral stone. We discussed the fact that he has been symptomatic potentially for a few months here although he has no obvious hydronephrosis. I gave him the options of proceeding with medical expulsion therapy versus ureteroscopic treatment for definitive therapy. He has waffle consider his options over the week and notify me how they would like to proceed early next week. We have tentatively discussed having him be scheduled for ureteroscopy and a couple of weeks with plans to strain his urine and cancel his scheduled surgery should he pass his stone before.     * Signed by Raynelle Bring, M.D. on 01/02/16 at 6:38 PM (EDT)*

## 2016-02-16 LAB — HEMOGLOBIN A1C
Hgb A1c MFr Bld: 6 % — ABNORMAL HIGH (ref 4.8–5.6)
Mean Plasma Glucose: 126 mg/dL

## 2016-02-18 ENCOUNTER — Encounter (HOSPITAL_COMMUNITY): Payer: Self-pay | Admitting: *Deleted

## 2016-02-18 ENCOUNTER — Ambulatory Visit (HOSPITAL_COMMUNITY): Payer: 59 | Admitting: Certified Registered Nurse Anesthetist

## 2016-02-18 ENCOUNTER — Encounter (HOSPITAL_COMMUNITY)
Admission: RE | Disposition: A | Discharge status: Home / Self Care | Payer: Self-pay | Source: Ambulatory Visit | Attending: Urology

## 2016-02-18 ENCOUNTER — Ambulatory Visit (HOSPITAL_COMMUNITY)
Admission: RE | Admit: 2016-02-18 | Discharge: 2016-02-18 | Disposition: A | Discharge status: Home / Self Care | Payer: 59 | Source: Ambulatory Visit | Attending: Urology | Admitting: Urology

## 2016-02-18 DIAGNOSIS — Z466 Encounter for fitting and adjustment of urinary device: Secondary | ICD-10-CM | POA: Diagnosis not present

## 2016-02-18 DIAGNOSIS — R3915 Urgency of urination: Secondary | ICD-10-CM | POA: Insufficient documentation

## 2016-02-18 DIAGNOSIS — I10 Essential (primary) hypertension: Secondary | ICD-10-CM | POA: Diagnosis not present

## 2016-02-18 DIAGNOSIS — M1990 Unspecified osteoarthritis, unspecified site: Secondary | ICD-10-CM | POA: Insufficient documentation

## 2016-02-18 DIAGNOSIS — Z8546 Personal history of malignant neoplasm of prostate: Secondary | ICD-10-CM | POA: Insufficient documentation

## 2016-02-18 DIAGNOSIS — E119 Type 2 diabetes mellitus without complications: Secondary | ICD-10-CM | POA: Diagnosis not present

## 2016-02-18 DIAGNOSIS — N529 Male erectile dysfunction, unspecified: Secondary | ICD-10-CM | POA: Insufficient documentation

## 2016-02-18 DIAGNOSIS — N201 Calculus of ureter: Secondary | ICD-10-CM | POA: Diagnosis not present

## 2016-02-18 DIAGNOSIS — Z885 Allergy status to narcotic agent status: Secondary | ICD-10-CM | POA: Insufficient documentation

## 2016-02-18 DIAGNOSIS — K219 Gastro-esophageal reflux disease without esophagitis: Secondary | ICD-10-CM | POA: Diagnosis not present

## 2016-02-18 DIAGNOSIS — I493 Ventricular premature depolarization: Secondary | ICD-10-CM | POA: Diagnosis not present

## 2016-02-18 DIAGNOSIS — Z888 Allergy status to other drugs, medicaments and biological substances status: Secondary | ICD-10-CM | POA: Diagnosis not present

## 2016-02-18 HISTORY — PX: CYSTOSCOPY WITH RETROGRADE PYELOGRAM, URETEROSCOPY AND STENT PLACEMENT: SHX5789

## 2016-02-18 LAB — GLUCOSE, CAPILLARY
Glucose-Capillary: 116 mg/dL — ABNORMAL HIGH (ref 65–99)
Glucose-Capillary: 111 mg/dL — ABNORMAL HIGH (ref 65–99)

## 2016-02-18 SURGERY — CYSTOURETEROSCOPY, WITH RETROGRADE PYELOGRAM AND STENT INSERTION
Anesthesia: General | Laterality: Left

## 2016-02-18 MED ORDER — LIDOCAINE 2% (20 MG/ML) 5 ML SYRINGE
INTRAMUSCULAR | Status: DC | PRN
  Administered 2016-02-18: 80 mg via INTRAVENOUS

## 2016-02-18 MED ORDER — ONDANSETRON HCL 4 MG/2ML IJ SOLN
INTRAMUSCULAR | Status: AC
Start: 2016-02-18 — End: 2016-02-18
  Filled 2016-02-18: qty 2

## 2016-02-18 MED ORDER — IOHEXOL 300 MG/ML  SOLN
INTRAMUSCULAR | Status: DC | PRN
  Administered 2016-02-18: 7 mL

## 2016-02-18 MED ORDER — PROMETHAZINE HCL 25 MG/ML IJ SOLN: 6.2500 mg | INTRAMUSCULAR | Status: DC | PRN

## 2016-02-18 MED ORDER — FENTANYL CITRATE (PF) 100 MCG/2ML IJ SOLN: 25.0000 ug | INTRAMUSCULAR | Status: DC | PRN

## 2016-02-18 MED ORDER — CEFAZOLIN SODIUM-DEXTROSE 2-4 GM/100ML-% IV SOLN
2.0000 g | INTRAVENOUS | Status: AC
  Administered 2016-02-18: 2 g via INTRAVENOUS
  Filled 2016-02-18: qty 100

## 2016-02-18 MED ORDER — DEXAMETHASONE SODIUM PHOSPHATE 10 MG/ML IJ SOLN
INTRAMUSCULAR | Status: AC
  Filled 2016-02-18: qty 1

## 2016-02-18 MED ORDER — FENTANYL CITRATE (PF) 100 MCG/2ML IJ SOLN
INTRAMUSCULAR | Status: AC
  Filled 2016-02-18: qty 2

## 2016-02-18 MED ORDER — LACTATED RINGERS IV SOLN
INTRAVENOUS | Status: DC
  Administered 2016-02-18: 10:00:00 via INTRAVENOUS

## 2016-02-18 MED ORDER — DEXAMETHASONE SODIUM PHOSPHATE 10 MG/ML IJ SOLN
INTRAMUSCULAR | Status: DC | PRN
  Administered 2016-02-18: 5 mg via INTRAVENOUS

## 2016-02-18 MED ORDER — SODIUM CHLORIDE 0.9 % IR SOLN
Status: DC | PRN
  Administered 2016-02-18: 4000 mL

## 2016-02-18 MED ORDER — HYDROCODONE-ACETAMINOPHEN 5-325 MG PO TABS: 1.0000 | ORAL_TABLET | Freq: Four times a day (QID) | ORAL | 0 refills | Status: DC | PRN

## 2016-02-18 MED ORDER — PROPOFOL 10 MG/ML IV BOLUS
INTRAVENOUS | Status: AC
  Filled 2016-02-18: qty 20

## 2016-02-18 MED ORDER — CEFAZOLIN SODIUM-DEXTROSE 2-4 GM/100ML-% IV SOLN
INTRAVENOUS | Status: AC
  Filled 2016-02-18: qty 100

## 2016-02-18 MED ORDER — MIDAZOLAM HCL 5 MG/5ML IJ SOLN
INTRAMUSCULAR | Status: DC | PRN
  Administered 2016-02-18 (×2): 1 mg via INTRAVENOUS

## 2016-02-18 MED ORDER — ONDANSETRON HCL 4 MG/2ML IJ SOLN
INTRAMUSCULAR | Status: DC | PRN
  Administered 2016-02-18: 4 mg via INTRAVENOUS

## 2016-02-18 MED ORDER — MIDAZOLAM HCL 2 MG/2ML IJ SOLN
INTRAMUSCULAR | Status: AC
  Filled 2016-02-18: qty 2

## 2016-02-18 MED ORDER — PROPOFOL 10 MG/ML IV BOLUS
INTRAVENOUS | Status: DC | PRN
  Administered 2016-02-18: 150 mg via INTRAVENOUS
  Administered 2016-02-18: 50 mg via INTRAVENOUS

## 2016-02-18 MED ORDER — LIDOCAINE 2% (20 MG/ML) 5 ML SYRINGE
INTRAMUSCULAR | Status: AC
  Filled 2016-02-18: qty 5

## 2016-02-18 SURGICAL SUPPLY — 20 items
BAG URO CATCHER STRL LF (MISCELLANEOUS) ×3 IMPLANT
BASKET ZERO TIP NITINOL 2.4FR (BASKET) IMPLANT
BENZOIN TINCTURE PRP APPL 2/3 (GAUZE/BANDAGES/DRESSINGS) ×3 IMPLANT
CATH INTERMIT  6FR 70CM (CATHETERS) ×3 IMPLANT
CLOTH BEACON ORANGE TIMEOUT ST (SAFETY) ×3 IMPLANT
DRSG TEGADERM 2-3/8X2-3/4 SM (GAUZE/BANDAGES/DRESSINGS) ×3 IMPLANT
FIBER LASER FLEXIVA 365 (UROLOGICAL SUPPLIES) IMPLANT
FIBER LASER TRAC TIP (UROLOGICAL SUPPLIES) IMPLANT
GLOVE BIOGEL M STRL SZ7.5 (GLOVE) ×3 IMPLANT
GOWN STRL REUS W/TWL LRG LVL3 (GOWN DISPOSABLE) ×6 IMPLANT
GUIDEWIRE ANG ZIPWIRE 038X150 (WIRE) ×3 IMPLANT
GUIDEWIRE STR DUAL SENSOR (WIRE) ×3 IMPLANT
IV NS 1000ML (IV SOLUTION) ×2
IV NS 1000ML BAXH (IV SOLUTION) ×1 IMPLANT
MANIFOLD NEPTUNE II (INSTRUMENTS) ×3 IMPLANT
PACK CYSTO (CUSTOM PROCEDURE TRAY) ×3 IMPLANT
SHEATH ACCESS URETERAL 38CM (SHEATH) ×3 IMPLANT
STENT URET 6FRX24 CONTOUR (STENTS) ×3 IMPLANT
TUBING CONNECTING 10 (TUBING) ×2 IMPLANT
TUBING CONNECTING 10' (TUBING) ×1

## 2016-02-18 NOTE — Anesthesia Procedure Notes (Signed)
Procedure Name: LMA Insertion Date/Time: 02/18/2016 11:27 AM Performed by: Dione Booze Pre-anesthesia Checklist: Patient identified, Emergency Drugs available, Suction available and Patient being monitored Patient Re-evaluated:Patient Re-evaluated prior to inductionOxygen Delivery Method: Circle system utilized Preoxygenation: Pre-oxygenation with 100% oxygen Intubation Type: IV induction LMA: LMA inserted LMA Size: 5.0 Number of attempts: 1 Placement Confirmation: positive ETCO2 Tube secured with: Tape Dental Injury: Teeth and Oropharynx as per pre-operative assessment

## 2016-02-18 NOTE — Op Note (Signed)
Preoperative diagnosis: Left ureteral calculus  Postoperative diagnosis: Left ureteral calculus  Procedure:  1. Cystoscopy 2. Left ureteroscopy 3. Left ureteral stent placement (6 x 24 with string) 4. Left retrograde pyelography with interpretation  Surgeon: Pryor Curia. M.D.  Anesthesia: General  Complications: None  Intraoperative findings: Left retrograde pyelography demonstrated a normal caliber ureter without hydronephrosis or renal filling defects without other abnormalities noted.  EBL: Minimal  Specimens: None  Disposition of specimens: [facility] for stone analysis  Indication: Arthur Klein  is a 70 y.o. patient with urolithiasis. He was found to have a 2 mm proximal left ureteral stone.  He was unable to pass this stone after a few weeks and ultimately elected to proceed with ureteroscopic treatment. After reviewing the management options for treatment, they elected to proceed with the above surgical procedure(s). We have discussed the potential benefits and risks of the procedure, side effects of the proposed treatment, the likelihood of the patient achieving the goals of the procedure, and any potential problems that might occur during the procedure or recuperation. Informed consent has been obtained.  Description of procedure:  The patient was taken to the operating room and general anesthesia was induced.  The patient was placed in the dorsal lithotomy position, prepped and draped in the usual sterile fashion, and preoperative antibiotics were administered. A preoperative time-out was performed.   Cystourethroscopy was performed.  The patient's urethra was examined and was normal. The bladder was then systematically examined in its entirety. There was no evidence for any bladder tumors, stones, or other mucosal pathology.    Attention then turned to the left ureteral orifice and a ureteral catheter was used to intubate the ureteral orifice.   Omnipaque contrast was injected through the ureteral catheter and a retrograde pyelogram was performed with findings as dictated above. Although the retrograde pyelography appeared normal, it was decided to proceed with ureteroscopy considering that the patient was still symptomatic and the stone was noted to be very small on imaging.  A 0.38 sensor guidewire was then advanced up the left ureter into the renal pelvis under fluoroscopic guidance. I then performed semi-rigid ureteroscopy next to the wire and was able to look up into the proximal mid ureter with no stones or other abnormalities noted. I placed a 2nd wire and attempted to place the digital flexible ureteroscope over the wire but this was not successful. A 12/14 Fr ureteral access sheath was then advanced over the guide wire. The digital flexible ureteroscope was then advanced through the access sheath into the ureter next to the guidewire.  The entire proximal ureter and renal collecting system was then carefully visualized and no stone was identified.  The safety wire was then replaced and the access sheath removed.  The guidewire was backloaded through the cystoscope and a ureteral stent was advance over the wire using Seldinger technique.  The stent was positioned appropriately under fluoroscopic and cystoscopic guidance.  The wire was then removed with an adequate stent curl noted in the renal pelvis as well as in the bladder.  The bladder was then emptied and the procedure ended.  The patient appeared to tolerate the procedure well and without complications.  The patient was able to be awakened and transferred to the recovery unit in satisfactory condition.   Pryor Curia MD

## 2016-02-18 NOTE — Anesthesia Preprocedure Evaluation (Addendum)
Anesthesia Evaluation  Patient identified by MRN, date of birth, ID band Patient awake    Reviewed: Allergy & Precautions, NPO status , Patient's Chart, lab work & pertinent test results  Airway Mallampati: III  TM Distance: >3 FB Neck ROM: Full    Dental  (+) Dental Advisory Given   Pulmonary neg pulmonary ROS,    breath sounds clear to auscultation       Cardiovascular hypertension,  Rhythm:Regular Rate:Normal  09/2015 Echo: NL EF. Mild MR, Mild AI.   Neuro/Psych negative neurological ROS     GI/Hepatic Neg liver ROS,   Endo/Other  diabetes, Type 2  Renal/GU Renal disease     Musculoskeletal  (+) Arthritis ,   Abdominal   Peds  Hematology negative hematology ROS (+)   Anesthesia Other Findings   Reproductive/Obstetrics                            Lab Results  Component Value Date   WBC 6.0 02/15/2016   HGB 13.9 02/15/2016   HCT 40.8 02/15/2016   MCV 88.5 02/15/2016   PLT 144 (L) 02/15/2016   Lab Results  Component Value Date   CREATININE 0.76 02/15/2016   BUN 16 02/15/2016   NA 140 02/15/2016   K 3.8 02/15/2016   CL 106 02/15/2016   CO2 26 02/15/2016    Anesthesia Physical Anesthesia Plan  ASA: II  Anesthesia Plan: General   Post-op Pain Management:    Induction: Intravenous  Airway Management Planned: LMA  Additional Equipment:   Intra-op Plan:   Post-operative Plan: Extubation in OR  Informed Consent: I have reviewed the patients History and Physical, chart, labs and discussed the procedure including the risks, benefits and alternatives for the proposed anesthesia with the patient or authorized representative who has indicated his/her understanding and acceptance.     Plan Discussed with:   Anesthesia Plan Comments:         Anesthesia Quick Evaluation

## 2016-02-18 NOTE — Transfer of Care (Signed)
  Immediate Anesthesia Transfer of Care Note  Patient: Arthur Klein  Procedure(s) Performed: Procedure(s): CYSTOSCOPY WITH LEFT RETROGRADE PYELOGRAM, URETEROSCOPY  AND URETERAL  STENT PLACEMENT (Left)  Patient Location: PACU  Anesthesia Type:General  Level of Consciousness:  sedated, patient cooperative and responds to stimulation  Airway & Oxygen Therapy:Patient Spontanous Breathing and Patient connected to face mask oxgen  Post-op Assessment:  Report given to PACU RN and Post -op Vital signs reviewed and stable  Post vital signs:  Reviewed and stable  Last Vitals:  Vitals:   02/18/16 0802 02/18/16 1212  BP: 138/68 (P) 109/84  Pulse: 66   Resp: 18   Temp: 123XX123 C     Complications: No apparent anesthesia complications

## 2016-02-18 NOTE — Discharge Instructions (Addendum)
General Anesthesia, Adult, Care After These instructions provide you with information about caring for yourself after your procedure. Your health care provider may also give you more specific instructions. Your treatment has been planned according to current medical practices, but problems sometimes occur. Call your health care provider if you have any problems or questions after your procedure. What can I expect after the procedure? After the procedure, it is common to have: Vomiting. A sore throat. Mental slowness. It is common to feel: Nauseous. Cold or shivery. Sleepy. Tired. Sore or achy, even in parts of your body where you did not have surgery. Follow these instructions at home: For at least 24 hours after the procedure: Do not: Participate in activities where you could fall or become injured. Drive. Use heavy machinery. Drink alcohol. Take sleeping pills or medicines that cause drowsiness. Make important decisions or sign legal documents. Take care of children on your own. Rest. Eating and drinking If you vomit, drink water, juice, or soup when you can drink without vomiting. Drink enough fluid to keep your urine clear or pale yellow. Make sure you have little or no nausea before eating solid foods. Follow the diet recommended by your health care provider. General instructions Have a responsible adult stay with you until you are awake and alert. Return to your normal activities as told by your health care provider. Ask your health care provider what activities are safe for you. Take over-the-counter and prescription medicines only as told by your health care provider. If you smoke, do not smoke without supervision. Keep all follow-up visits as told by your health care provider. This is important. Contact a health care provider if: You continue to have nausea or vomiting at home, and medicines are not helpful. You cannot drink fluids or start eating again. You cannot  urinate after 8-12 hours. You develop a skin rash. You have fever. You have increasing redness at the site of your procedure. Get help right away if: You have difficulty breathing. You have chest pain. You have unexpected bleeding. You feel that you are having a life-threatening or urgent problem. This information is not intended to replace advice given to you by your health care provider. Make sure you discuss any questions you have with your health care provider. Document Released: 05/26/2000 Document Revised: 07/23/2015 Document Reviewed: 02/01/2015 Elsevier Interactive Patient Education  2017 [place] may see some blood in the urine and may have some burning with urination for 48-72 hours. You also may notice that you have to urinate more frequently or urgently after your procedure which is normal.  2. You should call should you develop an inability urinate, fever > 101, persistent nausea and vomiting that prevents you from eating or drinking to stay hydrated.  3. If you have a stent, you will likely urinate more frequently and urgently until the stent is removed and you may experience some discomfort/pain in the lower abdomen and flank especially when urinating. You may take pain medication prescribed to you if needed for pain. You may also intermittently have blood in the urine until the stent is removed. 4. You may remove your stent on Thursday morning.  Simply pull the string that is taped to your body and the stent will easily come out.  This may be best done in the shower as some urine may come out with the stent.  Usually you will feel relief once the stent is removed, but occasionally patients can  develop pain due to residual swelling of the ureter that may temporarily obstruct the kidney.  This can be managed by taking pain medication and it will typically resolve with time.  Please do not hesitate to call if you have pain that is not controlled with your pain  medication or does not improved within 24-48 hours.

## 2016-02-18 NOTE — Anesthesia Postprocedure Evaluation (Signed)
Anesthesia Post Note  Patient: Arthur Klein  Procedure(s) Performed: Procedure(s) (LRB): CYSTOSCOPY WITH LEFT RETROGRADE PYELOGRAM, URETEROSCOPY  AND URETERAL  STENT PLACEMENT (Left)  Patient location during evaluation: PACU Anesthesia Type: General Level of consciousness: awake and alert Pain management: pain level controlled Vital Signs Assessment: post-procedure vital signs reviewed and stable Respiratory status: spontaneous breathing, nonlabored ventilation, respiratory function stable and patient connected to nasal cannula oxygen Cardiovascular status: blood pressure returned to baseline and stable Postop Assessment: no signs of nausea or vomiting Anesthetic complications: no       Last Vitals:  Vitals:   02/18/16 1300 02/18/16 1317  BP: 138/87 (!) 158/61  Pulse: 76 81  Resp: 17 16  Temp:  36.9 C    Last Pain:  Vitals:   02/18/16 0802  TempSrc: Oral                 Tiajuana Amass

## 2016-02-21 ENCOUNTER — Other Ambulatory Visit: Payer: Self-pay

## 2016-02-21 NOTE — Patient Outreach (Signed)
[facility]) Care Management  02/21/2016  Arthur Klein 03-11-1945 PH:[id number]   Telephone call to discuss [name] program with member. States that he is going off the [employer] on 03/03/16 and he will be on Medicare.    Instructed member that his case will be closed as he is no longer eligible for the [employer] program. Voiced understanding. Case closed as member has dis-enrolled from coverage. Case closure letter sent to MD and member. [name] RN, [facility] Care Management Coordinator-Link to [place] Management [phone]

## 2016-02-26 MED FILL — MICROLET LANCETS: 50 days supply | Qty: 100 | Fill #1

## 2016-02-26 MED FILL — CONTOUR NEXT STRIPS: 50 days supply | Qty: 100 | Fill #1

## 2016-02-26 MED FILL — AMOXICILLIN 500 MG CAPSULE: 500 | 5 days supply | Qty: 20 | Fill #3

## 2016-04-08 ENCOUNTER — Ambulatory Visit: Payer: 59

## 2016-06-12 MED FILL — CHLORHEXIDINE 0.12% RINSE: 0.12 | 30 days supply | Qty: 473 | Fill #0

## 2016-11-10 MED FILL — GAVILYTE-G SOLUTION: 236 | 1 days supply | Qty: 4000 | Fill #0

## 2017-03-10 MED FILL — CHLORHEXIDINE 0.12% RINSE: 0.12 | 30 days supply | Qty: 473 | Fill #1

## 2017-12-02 MED FILL — metFORMIN HCL 500 MG TABS: 500 | 90 days supply | Qty: 166 | Fill #0

## 2018-03-11 MED FILL — ONETOUCH VERIO TEST STRIP: 50 days supply | Qty: 100 | Fill #0

## 2018-05-21 MED FILL — metFORMIN HCL 500 MG TABS: 500 | 90 days supply | Qty: 180 | Fill #1

## 2018-11-25 MED FILL — metFORMIN HCL 500 MG TABS: 500 | 90 days supply | Qty: 180 | Fill #2

## 2019-06-16 MED FILL — METFORMIN HCL 500 MG TABS: 500 | 90 days supply | Qty: 180 | Fill #0

## 2021-02-06 NOTE — Progress Notes (Signed)
Cardiology Office Note   Date:  02/07/2021   ID:  Arthur Klein, DOB Aug 11, 1945, MRN [id number]  PCP:  Velna Hatchet, MD  Cardiologist:   None Referring:  Velna Hatchet, MD   Chief Complaint  Patient presents with   Palpitations      History of Present Illness: Arthur Klein is a 75 y.o. male who presents for evaluation of bradycardia.  Velna Hatchet, MD .  The patient recently was noted to have palpitations and an abnormal EKG.  He previously had PVCs noted in 2017.  At that time he had an abnormal stress test suggesting reduced ejection fraction.  There was no ischemia or infarction.  A month later follow-up echo demonstrated EF of 50 - 55%.  He has not had further work-up for follow-up since then.  He actually says he feels well.  He says he denies chest pressure, neck or arm discomfort.  Has had no palpitations, presyncope or syncope.  He denies any PND or orthopnea.  His wife who is nurse practitioner midwife listen to his heart and did not hear that he was skipping.  His heart rate was said to be 40 but likely was under counting ventricular bigeminy.  He has had no symptoms related to this.  He stays quite active.  He does have some discomfort on the bottoms of both of his feet.   Past Medical History:  Diagnosis Date   Arthritis    Complication of anesthesia    doesn't wake up well,combative until wife talks with him   Diabetes mellitus without complication ([place])    diet controlled    Esophageal reflux    Gout    hx of   H/O hiatal hernia    History of kidney stones    Hypertension    borderline HTNt   Plantar fasciitis    no problems now   PONV (postoperative nausea and vomiting)    Prostate cancer ([place]) 08/27/2012   gleason 6, volume 59.3 cc   PVC (premature ventricular contraction)    hx of    Skin cancer 2011   melanoma on back, resected    Undescended left testes     Past Surgical History:  Procedure Laterality Date   CYSTOSCOPY WITH RETROGRADE  PYELOGRAM, URETEROSCOPY AND STENT PLACEMENT Left 02/18/2016   Procedure: CYSTOSCOPY WITH LEFT RETROGRADE PYELOGRAM, URETEROSCOPY  AND URETERAL  STENT PLACEMENT;  Surgeon: Raynelle Bring, MD;  Location: [facility];  Service: Urology;  Laterality: Left;   HERNIA REPAIR     INGUINAL HERNIA REPAIR Bilateral 03/16/2015   Procedure: LAPAROSCOPIC REPAIR OF BILATERAL FEMEROL AND  BILATERAL INGUINAL HERNIAS WITH MESH;  Surgeon: Michael Boston, MD;  Location: [facility];  Service: General;  Laterality: Bilateral;   INSERTION OF MESH Right 03/16/2015   Procedure: INSERTION OF MESH;  Surgeon: Michael Boston, MD;  Location[facility];  Service: General;  Laterality: Right;   KNEE ARTHROPLASTY  05/02/2011   Procedure: COMPUTER ASSISTED TOTAL KNEE ARTHROPLASTY;  Surgeon: Alta Corning, MD;  Location: [place];  Service: Orthopedics;  Laterality: Right;   KNEE ARTHROSCOPY     right   LAPAROSCOPIC LYSIS OF ADHESIONS  03/16/2015   Procedure: LAPAROSCOPIC LYSIS OF ADHESIONS;  Surgeon: Michael Boston, MD;  [facility];  Service: General;;   MELANOMA EXCISION     stage 3+ lower left lumbar region with axillary node removal   MOHS SURGERY     mose surgery     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      amoxicillin (AMOXIL) 500 MG capsule Take 2,000 mg by mouth as directed. Take 4 capsules (2000 mg) by prior to dental appointment.  4   B Complex Vitamins (VITAMIN B COMPLEX) TABS Take 1 tablet by mouth  daily as needed (low energy).      BAYER CONTOUR NEXT TEST test strip 1 each by Other route daily.   2   BAYER MICROLET LANCETS lancets 1 each by Other route daily.   2   Blood Glucose Monitoring Suppl (CONTOUR NEXT EZ MONITOR) w/Device KIT 1 Device by Other route daily.   0   Cholecalciferol (VITAMIN D-3) 1000 UNITS CAPS Take 1,000 Units by mouth daily.      diphenhydrAMINE (BENADRYL) 12.5 MG/5ML elixir Take 3.125 mg by mouth 4 (four) times daily as needed (for allergies.).     Multiple Vitamins-Minerals (ICAPS AREDS 2 PO) Take 1 capsule by mouth 2 (two) times daily.     Nutritional Supplements (JUICE PLUS FIBRE PO) Take 2 capsules by mouth daily.     Probiotic Product (PROBIOTIC DAILY PO) Take 1 tablet by mouth daily.     No current facility-administered medications for this visit.    Allergies:   Erythrocin, Lisinopril, Nsaids, and Oxycodone    Social History:  The patient  reports that he has never smoked. His smokeless tobacco use includes chew. He reports that he does not drink alcohol and does not use drugs.   Family History:  The patient's family history includes Cancer in his father; Cancer (age of onset: 78) in his brother; Congestive Heart Failure in his mother; Diabetes in his father and mother; Hypertension in his father and mother; Stroke in his father.    ROS:  Please see the history of present illness.   Otherwise, review of systems are positive for none.   All other systems are reviewed and negative.    PHYSICAL EXAM: VS:  BP 122/64 (BP Location: Right Arm, Patient Position: Sitting, Cuff Size: Normal)   Pulse (!) 102   Ht 5' 8.5" (1.74 m)   Wt 202 lb (91.6 kg)   BMI 30.27 kg/m  , BMI Body mass index is 30.27 kg/m. GENERAL:  Well appearing HEENT:  Pupils equal round and reactive, fundi not visualized, oral mucosa unremarkable NECK:  No jugular venous distention, waveform within normal limits, carotid upstroke brisk and symmetric, no bruits, no  thyromegaly LYMPHATICS:  No cervical, inguinal adenopathy LUNGS:  Clear to auscultation bilaterally BACK:  No CVA tenderness CHEST:  Unremarkable HEART:  PMI not displaced or sustained,S1 and S2 within normal limits, no S3, no S4, no clicks, no rubs, soft apical brief systolic murmur, no diastolic murmurs ABD:  Flat, positive bowel sounds normal in frequency in pitch, no bruits, no rebound, no guarding, no midline pulsatile mass, no hepatomegaly, no splenomegaly EXT:  2 plus pulses upper and diminished dorsalis pedis  and posterior tibialis bilateral, no edema, no cyanosis no clubbing SKIN:  No rashes no nodules NEURO:  Cranial nerves II through XII grossly intact, motor grossly intact throughout PSYCH:  Cognitively intact, oriented to person place and time    EKG:  EKG is ordered today. The ekg ordered today demonstrates sinus rhythm, ventricular bigeminy, probable left atrial enlargement, axis within normal limits, intervals within normal limits, no acute ST-T wave changes.   Recent Labs: No results found for requested labs within last 8760 hours.    Lipid Panel No results found for: CHOL, TRIG, HDL, CHOLHDL, VLDL, LDLCALC, LDLDIRECT    Wt Readings from Last 3 Encounters:  02/07/21 202 lb (91.6 kg)  02/18/16 207 lb (93.9 kg)  02/15/16 207 lb (93.9 kg)      Other studies Reviewed: Additional studies/ records that were reviewed today include: Previous echo and perfusion study. Review of the above records demonstrates:  Please see elsewhere in the note.     ASSESSMENT AND PLAN:  PVCs: I am going to check an echocardiogram.  However, not suspecting that he is having any other sustained arrhythmias.  I think his bradycardia is actually under counted PVCs.  He has no symptoms really related to these.  At this point I will manage this expectantly pending the results of the echo.  I will check to see if I can find a TSH in the mid and if not order these.  FEET NUMBNESS: He does  have some reduced pulses and will get ABIs.  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  DM: His A1c is 8.0.  This is being followed by his primary provider and Ozempic.  Recently started.   Current medicines are reviewed at length with the patient today.  The patient does not have concerns regarding medicines.  The following changes have been made:  no change  Labs/ tests ordered today include:   Orders Placed This Encounter  Procedures   EKG 12-Lead   ECHOCARDIOGRAM COMPLETE   VAS [place] ABI WITH/WO TBI      Disposition:   FU with me as needed based on the results of the above   Signed, Minus Breeding, MD  02/07/2021 6:58 PM    [place]

## 2021-02-07 ENCOUNTER — Ambulatory Visit: Payer: Medicare Other | Admitting: Cardiology

## 2021-02-07 ENCOUNTER — Other Ambulatory Visit: Payer: Self-pay

## 2021-02-07 ENCOUNTER — Encounter: Payer: Self-pay | Admitting: Cardiology

## 2021-02-07 VITALS — BP 122/64 | HR 102 | Ht 68.5 in | Wt 202.0 lb

## 2021-02-07 DIAGNOSIS — R011 Cardiac murmur, unspecified: Secondary | ICD-10-CM | POA: Diagnosis not present

## 2021-02-07 DIAGNOSIS — M79672 Pain in left foot: Secondary | ICD-10-CM

## 2021-02-07 DIAGNOSIS — M79671 Pain in right foot: Secondary | ICD-10-CM | POA: Diagnosis not present

## 2021-02-07 NOTE — Patient Instructions (Signed)
Medication Instructions:  Your Physician recommend you continue on your current medication as directed.    *If you need a refill on your cardiac medications before your next appointment, please call your pharmacy*   Testing/Procedures: Your physician has requested that you have an echocardiogram. Echocardiography is a painless test that uses sound waves to create images of your heart. It provides your doctor with information about the size and shape of your heart and how well your heart's chambers and valves are working. This procedure takes approximately one hour. There are no restrictions for this procedure.  Your physician has requested that you have an ankle brachial index (ABI). During this test an ultrasound and blood pressure cuff are used to evaluate the arteries that supply the arms and legs with blood. Allow thirty minutes for this exam. There are no restrictions or special instructions.    Follow-Up: At [facility], you and your health needs are our priority.  As part of our continuing mission to provide you with exceptional heart care, we have created designated Provider Care Teams.  These Care Teams include your primary Cardiologist (physician) and Advanced Practice Providers (APPs -  Physician Assistants and Nurse Practitioners) who all work together to provide you with the care you need, when you need it.  We recommend signing up for the patient portal called "MyChart".  Sign up information is provided on this After Visit Summary.  MyChart is used to connect with patients for Virtual Visits (Telemedicine).  Patients are able to view lab/test results, encounter notes, upcoming appointments, etc.  Non-urgent messages can be sent to your provider as well.   To learn more about what you can do with MyChart, go to [email or web address].    Your next appointment:   3 month(s)  The format for your next appointment:   In Person  Provider:   Dr. Vita Barley, MD

## 2021-02-08 NOTE — Addendum Note (Signed)
Addended by: Betha Loa F on: 02/08/2021 10:20 AM   Modules accepted: Orders

## 2021-02-13 ENCOUNTER — Other Ambulatory Visit: Payer: Self-pay | Admitting: Cardiology

## 2021-02-13 DIAGNOSIS — M79671 Pain in right foot: Secondary | ICD-10-CM

## 2021-02-13 DIAGNOSIS — M79672 Pain in left foot: Secondary | ICD-10-CM

## 2021-02-15 ENCOUNTER — Encounter: Payer: Self-pay | Admitting: *Deleted

## 2021-02-15 LAB — BASIC METABOLIC PANEL
BUN/Creatinine Ratio: 13 (ref 10–24)
BUN: 13 mg/dL (ref 8–27)
CO2: 21 mmol/L (ref 20–29)
Calcium: 9.4 mg/dL (ref 8.6–10.2)
Chloride: 101 mmol/L (ref 96–106)
Creatinine, Ser: 1 mg/dL (ref 0.76–1.27)
Glucose: 141 mg/dL — ABNORMAL HIGH (ref 70–99)
Potassium: 4.3 mmol/L (ref 3.5–5.2)
Sodium: 139 mmol/L (ref 134–144)
eGFR: 79 mL/min/{1.73_m2} (ref >59)

## 2021-02-15 LAB — TSH: TSH: 2.34 u[IU]/mL (ref 0.450–4.500)

## 2021-02-27 ENCOUNTER — Ambulatory Visit (HOSPITAL_COMMUNITY)
Admission: RE | Admit: 2021-02-27 | Discharge: 2021-02-27 | Disposition: A | Discharge status: Home / Self Care | Payer: Medicare Other | Source: Ambulatory Visit | Attending: Internal Medicine | Admitting: Internal Medicine

## 2021-02-27 ENCOUNTER — Ambulatory Visit (HOSPITAL_BASED_OUTPATIENT_CLINIC_OR_DEPARTMENT_OTHER): Payer: Medicare Other

## 2021-02-27 ENCOUNTER — Other Ambulatory Visit: Payer: Self-pay

## 2021-02-27 DIAGNOSIS — R011 Cardiac murmur, unspecified: Secondary | ICD-10-CM | POA: Diagnosis present

## 2021-02-27 DIAGNOSIS — M79671 Pain in right foot: Secondary | ICD-10-CM | POA: Diagnosis present

## 2021-02-27 DIAGNOSIS — M79672 Pain in left foot: Secondary | ICD-10-CM | POA: Insufficient documentation

## 2021-02-27 LAB — ECHOCARDIOGRAM COMPLETE
Area-P 1/2: 4.49 cm2
P 1/2 time: 348 msec
S' Lateral: 4.65 cm

## 2021-03-05 ENCOUNTER — Telehealth: Payer: Self-pay | Admitting: *Deleted

## 2021-03-05 DIAGNOSIS — I429 Cardiomyopathy, unspecified: Secondary | ICD-10-CM

## 2021-03-05 MED ORDER — SACUBITRIL-VALSARTAN 24-26 MG PO TABS: 1.0000 | ORAL_TABLET | Freq: Two times a day (BID) | ORAL | 11 refills | Status: DC

## 2021-03-05 NOTE — Telephone Encounter (Signed)
-----   Message from Minus Breeding, MD sent at 03/04/2021  1:34 PM EST ----- The EF is slightly lower than previous.  I would like to start Entresto 24/26 mg bid and have him come back to see me at the end of this month.  Check a BMET in two weeks.  Call Mr. Alper with the results and send results to Velna Hatchet, MD

## 2021-03-05 NOTE — Telephone Encounter (Signed)
Spoke with pt and wife, aware of dr hochrein's recommendations. New script sent to the pharmacy. They will come by the office and pick up the savings card for the entresto,(placed at the front desk) and lab orders for 2 weeks included. Follow up scheduled

## 2021-03-22 LAB — BASIC METABOLIC PANEL
BUN/Creatinine Ratio: 13 (ref 10–24)
BUN: 15 mg/dL (ref 8–27)
CO2: 22 mmol/L (ref 20–29)
Calcium: 10 mg/dL (ref 8.6–10.2)
Chloride: 101 mmol/L (ref 96–106)
Creatinine, Ser: 1.12 mg/dL (ref 0.76–1.27)
Glucose: 148 mg/dL — ABNORMAL HIGH (ref 70–99)
Potassium: 4 mmol/L (ref 3.5–5.2)
Sodium: 140 mmol/L (ref 134–144)
eGFR: 69 mL/min/{1.73_m2} (ref >59)

## 2021-03-25 ENCOUNTER — Encounter: Payer: Self-pay | Admitting: *Deleted

## 2021-03-27 DIAGNOSIS — I420 Dilated cardiomyopathy: Secondary | ICD-10-CM | POA: Insufficient documentation

## 2021-03-27 DIAGNOSIS — M79672 Pain in left foot: Secondary | ICD-10-CM | POA: Insufficient documentation

## 2021-03-27 NOTE — Progress Notes (Signed)
Cardiology Office Note   Date:  03/28/2021   ID:  Arthur Klein, DOB 1945-11-15, MRN [id number]  PCP:  Velna Hatchet, MD  Cardiologist:   Minus Breeding, MD Referring:  Velna Hatchet, MD   Chief Complaint  Patient presents with   Palpitations      History of Present Illness: Arthur Klein is a 76 y.o. male who presents for evaluation of bradycardia.  Velna Hatchet, MD .  The patient recently was noted to have palpitations and an abnormal EKG.  He previously had PVCs noted in 2017.  At that time he had an abnormal stress test suggesting reduced ejection fraction.  There was no ischemia or infarction.  A month later follow-up echo demonstrated EF of 50 - 55%.        At the last visit he had increased palpitations.  Echo suggested that the EF was slightly lower than previous at 40 - 45% so I changed him to [facility].  He has had some episodes of dizziness and wonders if it could be this or he went up on his diabetes medicine.  1 episode was at a ball game where he was getting particularly agitated.  He felt his heart racing.  He stood up and he was little dizzy.  Has had some probably decreased gait.  He has had a little off balance that is likely not related to the [place].  He is not really describing orthostatic symptoms.  He denies any chest pressure, neck or arm discomfort.  He has had no new PND or orthopnea.  He has had no weight gain or edema  Past Medical History:  Diagnosis Date   Arthritis    Complication of anesthesia    doesn't wake up well,combative until wife talks with him   Diabetes mellitus without complication ([place])    diet controlled    Esophageal reflux    Gout    hx of   H/O hiatal hernia    History of kidney stones    Hypertension    borderline HTNt   Plantar fasciitis    no problems now   PONV (postoperative nausea and vomiting)    Prostate cancer ([place]) 08/27/2012   gleason 6, volume 59.3 cc   PVC (premature ventricular contraction)    hx of     Skin cancer 2011   melanoma on back, resected    Undescended left testes     Past Surgical History:  Procedure Laterality Date   CYSTOSCOPY WITH RETROGRADE PYELOGRAM, URETEROSCOPY AND STENT PLACEMENT Left 02/18/2016   Procedure: CYSTOSCOPY WITH LEFT RETROGRADE PYELOGRAM, URETEROSCOPY  AND URETERAL  STENT PLACEMENT;  Surgeon: Raynelle Bring, MD;  [facility];  Service: Urology;  Laterality: Left;   HERNIA REPAIR     INGUINAL HERNIA REPAIR Bilateral 03/16/2015   Procedure: LAPAROSCOPIC REPAIR OF BILATERAL FEMEROL AND  BILATERAL INGUINAL HERNIAS WITH MESH;  Surgeon: Michael Boston, MD;  [facility];  Service: General;  Laterality: Bilateral;   INSERTION OF MESH Right 03/16/2015   Procedure: INSERTION OF MESH;  Surgeon: Michael Boston, MD;  [facility];  Service: General;  Laterality: Right;   KNEE ARTHROPLASTY  05/02/2011   Procedure: COMPUTER ASSISTED TOTAL KNEE ARTHROPLASTY;  Surgeon: Alta Corning, MD;  Location: [place];  Service: Orthopedics;  Laterality: Right;   KNEE ARTHROSCOPY     right   LAPAROSCOPIC LYSIS OF ADHESIONS  03/16/2015   Procedure: LAPAROSCOPIC LYSIS OF ADHESIONS;  Surgeon: Michael Boston, MD;  [facility];  Service: General;;   MELANOMA EXCISION     stage 3+ lower left lumbar region with axillary node removal   MOHS SURGERY     mose surgery     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      B-D UF III MINI PEN NEEDLES 31G X 5 MM  MISC See admin instructions.     BAYER CONTOUR NEXT TEST test strip 1 each by Other route daily.   2   BAYER MICROLET LANCETS lancets 1 each by Other route daily.   2   Blood Glucose Monitoring Suppl (CONTOUR NEXT EZ MONITOR) w/Device KIT 1 Device by Other route daily.   0   chlorhexidine (PERIDEX) 0.12 % solution SMARTSIG:1 Capful(s) By Mouth Twice Daily     Cholecalciferol (VITAMIN D-3) 1000 UNITS CAPS Take 1,000 Units by mouth daily.      diphenhydrAMINE (BENADRYL) 12.5 MG/5ML elixir Take 3.125 mg by mouth 4 (four) times daily as needed (for allergies.).     Multiple Vitamins-Minerals (ICAPS AREDS 2 PO) Take 1 capsule by mouth 2 (two) times daily.     Nutritional Supplements (JUICE PLUS FIBRE PO) Take 2 capsules by mouth daily.     Probiotic Product (PROBIOTIC DAILY PO) Take 1 tablet by mouth daily.     sacubitril-valsartan (ENTRESTO) 24-26 MG Take 1 tablet by mouth 2 (two) times daily. 60 tablet 11   Semaglutide,0.25 or 0.5MG/DOS, (OZEMPIC, 0.25 OR 0.5 MG/DOSE,) 2 MG/1.5ML SOPN inject 0.25m once per week x 1 month, then advance to 0.514monce per week. ICD10 : E11.65     amoxicillin (AMOXIL) 500 MG capsule Take 2,000 mg by mouth as directed. Take 4 capsules (2000 mg) by prior to dental appointment. (Patient not taking: Reported on 03/28/2021)  4   B Complex Vitamins (VITAMIN B COMPLEX) TABS Take 1 tablet by mouth daily as needed (low energy).  (Patient not taking: Reported on 03/28/2021)     colchicine 0.6 MG tablet 1 tablet (Patient not taking: Reported on 03/28/2021)     Insulin Pen Needle (PEN NEEDLES) 31G X 6 MM MISC use for ozempic as directed. ICD10: E11.65     No current facility-administered medications for this visit.    Allergies:   Erythrocin, Lisinopril, Nsaids, and Oxycodone    ROS:  Please see the history of present illness.   Otherwise, review of systems are positive for none.   All other systems are reviewed and negative.    PHYSICAL EXAM: VS:  BP (!) 134/56 (BP Location:  Left Arm, Patient Position: Sitting, Cuff Size: Large)    Pulse 88    Ht 5' 8.5" (1.74 m)    Wt 206 lb 12.8 oz (93.8 kg)    SpO2 95%    BMI 30.99 kg/m  , BMI Body mass index is 30.99 kg/m. GENERAL:  Well appearing NECK:  No jugular venous distention, waveform within normal limits,  carotid upstroke brisk and symmetric, no bruits, no thyromegaly LUNGS:  Clear to auscultation bilaterally CHEST:  Unremarkable HEART:  PMI not displaced or sustained,S1 and S2 within normal limits, no S3, no S4, no clicks, no rubs, grade 2 out of 6 soft apical systolic murmur, no diastolic murmurs ABD:  Flat, positive bowel sounds normal in frequency in pitch, no bruits, no rebound, no guarding, no midline pulsatile mass, no hepatomegaly, no splenomegaly EXT:  2 plus pulses throughout, no edema, no cyanosis no clubbing   EKG:  EKG is  ordered today. The ekg ordered sinus rhythm with premature ventricular contractions, axis within normal limits, intervals within normal limits, no acute ST-T wave changes.  Demonstrates sinus rhythm, ventricular bigeminy, probable left atrial enlargement, axis within normal limits, intervals within normal limits, no acute ST-T wave changes.   Recent Labs: 02/14/2021: TSH 2.340 03/21/2021: BUN 15; Creatinine, Ser 1.12; Potassium 4.0; Sodium 140    Lipid Panel No results found for: CHOL, TRIG, HDL, CHOLHDL, VLDL, LDLCALC, LDLDIRECT    Wt Readings from Last 3 Encounters:  03/28/21 206 lb 12.8 oz (93.8 kg)  02/07/21 202 lb (91.6 kg)  02/18/16 207 lb (93.9 kg)      Other studies Reviewed: Additional studies/ records that were reviewed today include:  Echo Review of the above records demonstrates:  Please see elsewhere in the note.     ASSESSMENT AND PLAN:  PVCs: I am going to order a 3-day Zio patch.  He has had normal electrolytes.  FEET NUMBNESS:   ABIs were normal.  No change in therapy.  No further vascular evaluation  REDUCED EF: He does have some mildly reduced  ejection fraction .  However, he has had some increased dizziness which could be related to the [facility].  I would like to add a low-dose beta-blocker.  However, I am going to move slowly I had a long discussion with patient and his wife about this.   DM: His A1c is now up to 8.2 from 8.0.  This is being followed by his primary provider and Ozempic was recently increased  Current medicines are reviewed at length with the patient today.  The patient does not have concerns regarding medicines.  The following changes have been made:  no change  Labs/ tests ordered today include:   Orders Placed This Encounter  Procedures   LONG TERM MONITOR (3-14 DAYS)   EKG 12-Lead      Disposition:   FU with me in six months.    Signed, Minus Breeding, MD  03/28/2021 11:06 AM    [facility]

## 2021-03-28 ENCOUNTER — Ambulatory Visit: Payer: Medicare Other | Admitting: Cardiology

## 2021-03-28 ENCOUNTER — Other Ambulatory Visit: Payer: Self-pay

## 2021-03-28 ENCOUNTER — Ambulatory Visit (INDEPENDENT_AMBULATORY_CARE_PROVIDER_SITE_OTHER): Payer: Medicare Other

## 2021-03-28 ENCOUNTER — Encounter: Payer: Self-pay | Admitting: Cardiology

## 2021-03-28 VITALS — BP 134/56 | HR 88 | Ht 68.5 in | Wt 206.8 lb

## 2021-03-28 DIAGNOSIS — I420 Dilated cardiomyopathy: Secondary | ICD-10-CM | POA: Diagnosis not present

## 2021-03-28 DIAGNOSIS — I493 Ventricular premature depolarization: Secondary | ICD-10-CM

## 2021-03-28 DIAGNOSIS — M79671 Pain in right foot: Secondary | ICD-10-CM | POA: Diagnosis not present

## 2021-03-28 DIAGNOSIS — E118 Type 2 diabetes mellitus with unspecified complications: Secondary | ICD-10-CM

## 2021-03-28 DIAGNOSIS — M79672 Pain in left foot: Secondary | ICD-10-CM

## 2021-03-28 NOTE — Patient Instructions (Signed)
Medication Instructions:  Your Physician recommend you continue on your current medication as directed.    *If you need a refill on your cardiac medications before your next appointment, please call your pharmacy*   Testing/Procedures: [place] Monitor Instructions  Your physician has requested you wear a ZIO patch monitor for 3 days.  This is a single patch monitor. [employer] supplies one patch monitor per enrollment. Additional stickers are not available. Please do not apply patch if you will be having a Nuclear Stress Test,  Echocardiogram, Cardiac CT, MRI, or Chest Xray during the period you would be wearing the  monitor. The patch cannot be worn during these tests. You cannot remove and re-apply the  ZIO XT patch monitor.  Your ZIO patch monitor will be mailed 3 day USPS to your address on file. It may take 3-5 days  to receive your monitor after you have been enrolled.  Once you have received your monitor, please review the enclosed instructions. Your monitor  has already been registered assigning a specific monitor serial # to you.  Billing and Patient Assistance Program Information  We have supplied [employer] with any of your insurance information on file for billing purposes. [employer] offers a sliding scale Patient Assistance Program for patients that do not have  insurance, or whose insurance does not completely cover the cost of the ZIO monitor.  You must apply for the Patient Assistance Program to qualify for this discounted rate.  To apply, please call [employer] at [phone], select option 4, select option 2, ask to apply for  Patient Assistance Program. Theodore Demark will ask your household income, and how many people  are in your household. They will quote your out-of-pocket cost based on that information.  [employer] will also be able to set up a 81-month, interest-free payment plan if needed.  Applying the monitor   Shave hair from upper left chest.  Hold abrader disc  by orange tab. Rub abrader in 40 strokes over the upper left chest as  indicated in your monitor instructions.  Clean area with 4 enclosed alcohol pads. Let dry.  Apply patch as indicated in monitor instructions. Patch will be placed under collarbone on left  side of chest with arrow pointing upward.  Rub patch adhesive wings for 2 minutes. Remove white label marked "1". Remove the white  label marked "2". Rub patch adhesive wings for 2 additional minutes.  While looking in a mirror, press and release button in center of patch. A small green light will  flash 3-4 times. This will be your only indicator that the monitor has been turned on.  Do not shower for the first 24 hours. You may shower after the first 24 hours.  Press the button if you feel a symptom. You will hear a small click. Record Date, Time and  Symptom in the Patient Logbook.  When you are ready to remove the patch, follow instructions on the last 2 pages of Patient  Logbook. Stick patch monitor onto the last page of Patient Logbook.  Place Patient Logbook in the blue and white box. Use locking tab on box and tape box closed  securely. The blue and white box has prepaid postage on it. Please place it in the mailbox as  soon as possible. Your physician should have your test results approximately 7 days after the  monitor has been mailed back to [facility].  Call [place] at [phone] if you have questions regarding  your ZIO  XT patch monitor. Call them immediately if you see an orange light blinking on your  monitor.  If your monitor falls off in less than 4 days, contact our Monitor department at [phone].  If your monitor becomes loose or falls off after 4 days call [employer] at [phone] for  suggestions on securing your monitor    Follow-Up: At [facility], you and your health needs are our priority.  As part of our continuing mission to provide you with exceptional heart care, we  have created designated Provider Care Teams.  These Care Teams include your primary Cardiologist (physician) and Advanced Practice Providers (APPs -  Physician Assistants and Nurse Practitioners) who all work together to provide you with the care you need, when you need it.  We recommend signing up for the patient portal called "MyChart".  Sign up information is provided on this After Visit Summary.  MyChart is used to connect with patients for Virtual Visits (Telemedicine).  Patients are able to view lab/test results, encounter notes, upcoming appointments, etc.  Non-urgent messages can be sent to your provider as well.   To learn more about what you can do with MyChart, go to [email or web address].    Your next appointment:   6 month(s)  The format for your next appointment:   In Person  Provider:   Minus Breeding, MD

## 2021-03-28 NOTE — Progress Notes (Unsigned)
Enrolled patient for a 3 day Zio XT monitor to be mailed to patients home  

## 2021-03-31 DIAGNOSIS — M79672 Pain in left foot: Secondary | ICD-10-CM

## 2021-03-31 DIAGNOSIS — I493 Ventricular premature depolarization: Secondary | ICD-10-CM

## 2021-03-31 DIAGNOSIS — I420 Dilated cardiomyopathy: Secondary | ICD-10-CM

## 2021-03-31 DIAGNOSIS — E118 Type 2 diabetes mellitus with unspecified complications: Secondary | ICD-10-CM

## 2021-03-31 DIAGNOSIS — M79671 Pain in right foot: Secondary | ICD-10-CM | POA: Diagnosis not present

## 2021-05-08 ENCOUNTER — Ambulatory Visit: Payer: Medicare Other | Admitting: Cardiology

## 2021-06-16 DIAGNOSIS — R42 Dizziness and giddiness: Secondary | ICD-10-CM | POA: Insufficient documentation

## 2021-06-16 NOTE — Progress Notes (Signed)
?  ?Cardiology Office Note ? ? ?Date:  06/18/2021  ? ?ID:  Arthur Klein, DOB [birth year], MRN [id number] ? ?PCP:  Velna Hatchet, MD  ?Cardiologist:   Minus Breeding, MD ?Referring:  Velna Hatchet, MD ? ? ?Chief Complaint  ?Patient presents with  ? Cardiomyopathy  ? ? ?  ?History of Present Illness: ?Arthur Klein is a 76 y.o. male who presents for evaluation of bradycardia.  Velna Hatchet, MD .  The patient recently was noted to have palpitations and an abnormal EKG.  He previously had PVCs noted in 2017.  At that time he had an abnormal stress test suggesting reduced ejection fraction.  There was no ischemia or infarction.  A month later follow-up echo demonstrated EF of 50 - 55%.      In Dec 2022 Echo suggested that the EF was slightly lower than previous at 40 - 45% so I changed him to [place].  He has had some episodes of dizziness and had a monitor with runs of NSVT.  This was in Feb 2023.  ? ?I brought him back to discuss the fact that he had frequent runs up to 20 beats of nonsustained ventricular tachycardia and he really does not feel this.  He does not feel palpitations, presyncope or syncope.  He does not have any chest pressure, neck or arm discomfort.  He only gets tired if he says that he works hard although he does fall asleep easily.  He does not sleep as well at night as he used to.  He is not describing PND or orthopnea.  There is really only a little snoring per his wife. ? ? ?Past Medical History:  ?Diagnosis Date  ? Arthritis   ? Complication of anesthesia   ? doesn't wake up well,combative until wife talks with him  ? Diabetes mellitus without complication ([place])   ? diet controlled   ? Esophageal reflux   ? Gout   ? hx of  ? H/O hiatal hernia   ? History of kidney stones   ? Hypertension   ? borderline HTNt  ? Plantar fasciitis   ? no problems now  ? PONV (postoperative nausea and vomiting)   ? Prostate cancer ([place]) 08/27/2012  ? gleason 6, volume 59.3 cc  ? PVC (premature ventricular  contraction)   ? hx of   ? Skin cancer 2011  ? melanoma on back, resected   ? Undescended left testes   ? ? ?Past Surgical History:  ?Procedure Laterality Date  ? CYSTOSCOPY WITH RETROGRADE PYELOGRAM, URETEROSCOPY AND STENT PLACEMENT Left 02/18/2016  ? Procedure: CYSTOSCOPY WITH LEFT RETROGRADE PYELOGRAM, URETEROSCOPY  AND URETERAL  STENT PLACEMENT;  Surgeon: Raynelle Bring, MD;  Location: [facility];  Service: Urology;  Laterality: Left;  ? HERNIA REPAIR    ? INGUINAL HERNIA REPAIR Bilateral 03/16/2015  ? Procedure: LAPAROSCOPIC REPAIR OF BILATERAL FEMEROL AND  BILATERAL INGUINAL HERNIAS WITH MESH;  Surgeon: Michael Boston, MD;  Location: [facility];  Service: General;  Laterality: Bilateral;  ? INSERTION OF MESH Right 03/16/2015  ? Procedure: INSERTION OF MESH;  Surgeon: Michael Boston, MD;  Location: [facility];  Service: General;  Laterality: Right;  ? KNEE ARTHROPLASTY  05/02/2011  ? Procedure: COMPUTER ASSISTED TOTAL KNEE ARTHROPLASTY;  Surgeon: Alta Corning, MD;  Location: [facility];  Service: Orthopedics;  Laterality: Right;  ? KNEE ARTHROSCOPY    ? right  ? LAPAROSCOPIC LYSIS OF ADHESIONS  03/16/2015  ? Procedure: LAPAROSCOPIC LYSIS OF ADHESIONS;  Surgeon: Michael Boston,  MD;  Location: [facility];  Service: General;;  ? MELANOMA EXCISION    ? stage 3+ lower left lumbar region with axillary node removal  ? MOHS SURGERY    ? mose surgery    ? 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 PROSTATE BIOPSY  08/27/12  ? ROBOT ASSISTED LAPAROSCOPIC RADICAL PROSTATECTOMY N/A 11/04/2012  ? Procedure: ROBOTIC ASSISTED LAPAROSCOPIC RADICAL PROSTATECTOMY LEVEL 1;  Surgeon: Dutch Gray, MD;  Location: [facility];  Service: Urology;  Laterality: N/A;  ? ROTATOR CUFF REPAIR    ? right  ? ROTATOR CUFF REPAIR    ? left  ? SHOULDER ARTHROSCOPY    ? left  ? ? ? ?Current Outpatient Medications  ?Medication Sig Dispense Refill  ? acetaminophen (TYLENOL) 500 MG tablet Take 500-1,000 mg by mouth every 6 (six) hours as needed for fever (for pain.).     ? amoxicillin  (AMOXIL) 500 MG capsule Take 2,000 mg by mouth as directed. Take 4 capsules (2000 mg) by prior to dental appointment.  4  ? B Complex Vitamins (VITAMIN B COMPLEX) TABS Take 1 tablet by mouth daily as needed (low energy).    ? B-D UF III MINI PEN NEEDLES 31G X 5 MM MISC See admin instructions.    ? BAYER CONTOUR NEXT TEST test strip 1 each by Other route daily.   2  ? BAYER MICROLET LANCETS lancets 1 each by Other route daily.   2  ? Blood Glucose Monitoring Suppl (CONTOUR NEXT EZ MONITOR) w/Device KIT 1 Device by Other route daily.   0  ? chlorhexidine (PERIDEX) 0.12 % solution SMARTSIG:1 Capful(s) By Mouth Twice Daily    ? Cholecalciferol (VITAMIN D-3) 1000 UNITS CAPS Take 1,000 Units by mouth daily.     ? colchicine 0.6 MG tablet     ? diphenhydrAMINE (BENADRYL) 12.5 MG/5ML elixir Take 3.125 mg by mouth 4 (four) times daily as needed (for allergies.).    ? Insulin Pen Needle (PEN NEEDLES) 31G X 6 MM MISC use for ozempic as directed. ICD10: E11.65    ? Multiple Vitamins-Minerals (ICAPS AREDS 2 PO) Take 1 capsule by mouth 2 (two) times daily.    ? Nutritional Supplements (JUICE PLUS FIBRE PO) Take 2 capsules by mouth daily.    ? Probiotic Product (PROBIOTIC DAILY PO) Take 1 tablet by mouth daily.    ? sacubitril-valsartan (ENTRESTO) 24-26 MG Take 1 tablet by mouth 2 (two) times daily. 60 tablet 11  ? Semaglutide,0.25 or 0.5MG/DOS, (OZEMPIC, 0.25 OR 0.5 MG/DOSE,) 2 MG/1.5ML SOPN inject 0.81m once per week x 1 month, then advance to 0.549monce per week. ICD10 : E11.65    ? ?No current facility-administered medications for this visit.  ? ? ?Allergies:   Erythrocin, Lisinopril, Nsaids, and Oxycodone  ? ? ?ROS:  Please see the history of present illness.   Otherwise, review of systems are positive for none.   All other systems are reviewed and negative.  ? ? ?PHYSICAL EXAM: ?VS:  BP 132/70 (BP Location: Left Arm, Patient Position: Sitting, Cuff Size: Normal)   Pulse (!) 57   Ht 5' 8.5" (1.74 m)   Wt 203 lb (92.1 kg)    BMI 30.42 kg/m?  , BMI Body mass index is 30.42 kg/m?. ?GENERAL:  Well appearing ?NECK:  No jugular venous distention, waveform within normal limits, carotid upstroke brisk and symmetric, no bruits, no thyromegaly ?LUNGS:  Clear to auscultation bilaterally ?CHEST:  Unremarkable ?HEART:  PMI not displaced or sustained,S1  and S2 within normal limits, no S3, no S4, no clicks, no rubs, soft apical systolic murmur nonradiating, no diastolic murmurs ?ABD:  Flat, positive bowel sounds normal in frequency in pitch, no bruits, no rebound, no guarding, no midline pulsatile mass, no hepatomegaly, no splenomegaly ?EXT:  2 plus pulses throughout, no edema, no cyanosis no clubbing ? ? ? ? ? ?EKG:  EKG is not ordered today. ?NA  ? ? ?Recent Labs: ?02/14/2021: TSH 2.340 ?03/21/2021: BUN 15; Creatinine, Ser 1.12; Potassium 4.0; Sodium 140  ? ? ?Lipid Panel ?No results found for: CHOL, TRIG, HDL, CHOLHDL, VLDL, LDLCALC, LDLDIRECT ?  ? ?Wt Readings from Last 3 Encounters:  ?06/18/21 203 lb (92.1 kg)  ?03/28/21 206 lb 12.8 oz (93.8 kg)  ?02/07/21 202 lb (91.6 kg)  ?  ? ? ?Other studies Reviewed: ?Additional studies/ records that were reviewed today include:  Monitor, labs ?Review of the above records demonstrates:  Please see elsewhere in the note.   ? ? ?ASSESSMENT AND PLAN: ? ?PVCs:   There was no etiology for his dizziness and he is not really describing this now.  It does not seem to be related to the ectopy that I am describing.  However, given the nonsustained ventricular tachycardia and the slightly reduced ejection fraction I need to exclude obstructive coronary disease.  I am going to try to order a coronary CT though I understand that he has a lot of ectopy on the day of the study we might have to abort this and I would do a perfusion study.  ? ?FEET NUMBNESS:   ABIs were normal.  No further work-up  ? ?REDUCED EF:   This will be evaluated as above.  I am going to likely try to titrate beta-blockers after the above study as  he might have an arrhythmia induced cardiomyopathy if there is no obstructive coronary disease.  ? ?DM: His A1c is 6.9 and I am proud of this.  ? ?Current medicines are reviewed at length with the patient toda

## 2021-06-18 ENCOUNTER — Encounter: Payer: Self-pay | Admitting: Cardiology

## 2021-06-18 ENCOUNTER — Ambulatory Visit: Payer: Medicare Other | Admitting: Cardiology

## 2021-06-18 VITALS — BP 132/70 | HR 57 | Ht 68.5 in | Wt 203.0 lb

## 2021-06-18 DIAGNOSIS — R42 Dizziness and giddiness: Secondary | ICD-10-CM | POA: Diagnosis not present

## 2021-06-18 DIAGNOSIS — I420 Dilated cardiomyopathy: Secondary | ICD-10-CM

## 2021-06-18 NOTE — Patient Instructions (Signed)
Medication Instructions:  ?No changes ?*If you need a refill on your cardiac medications before your next appointment, please call your pharmacy* ? ? ?Lab Work: ?Your provider would like for you to have the following labs today: BMET for the CT ? ?If you have labs (blood work) drawn today and your tests are completely normal, you will receive your results only by: ?MyChart Message (if you have MyChart) OR ?A paper copy in the mail ?If you have any lab test that is abnormal or we need to change your treatment, we will call you to review the results. ? ?Follow-Up: ?At [facility], you and your health needs are our priority.  As part of our continuing mission to provide you with exceptional heart care, we have created designated Provider Care Teams.  These Care Teams include your primary Cardiologist (physician) and Advanced Practice Providers (APPs -  Physician Assistants and Nurse Practitioners) who all work together to provide you with the care you need, when you need it. ? ?We recommend signing up for the patient portal called "MyChart".  Sign up information is provided on this After Visit Summary.  MyChart is used to connect with patients for Virtual Visits (Telemedicine).  Patients are able to view lab/test results, encounter notes, upcoming appointments, etc.  Non-urgent messages can be sent to your provider as well.   ?To learn more about what you can do with MyChart, go to [email or web address].   ? ?Your next appointment:   ?2 month(s) ? ?The format for your next appointment:   ?In Person ? ?Provider:   ?Minus Breeding, MD { ? ? ?Other Instructions ? ? ?Your cardiac CT will be scheduled at one of the below locations:  ? ?[facility] ?[address] ?[place], [place] [address] ?[phone] ? ?OR ? ?[place] ?[address] ?[address] ?[place], [place] [address] ?[phone] ? ?If scheduled at [facility], please arrive at the [facility] and  Children's Entrance (Entrance C2) of [facility] 30 minutes prior to test start time. ?You can use the FREE valet parking offered at entrance C (encouraged to control the heart rate for the test)  ?Proceed to the [facility] Radiology Department (first floor) to check-in and test prep. ? ?All radiology patients and guests should use entrance C2 at [facility], accessed from [facility], even though the hospital's physical address listed is [address]. ? ? ? ?If scheduled at [facility], please arrive 15 mins early for check-in and test prep. ? ?Please follow these instructions carefully (unless otherwise directed): ? ?Hold all erectile dysfunction medications at least 3 days (72 hrs) prior to test. ? ?On the Night Before the Test: ?Be sure to Drink plenty of water. ?Do not consume any caffeinated/decaffeinated beverages or chocolate 12 hours prior to your test. ?Do not take any antihistamines 12 hours prior to your test. ?If the patient has contrast allergy: ? ?On the Day of the Test: ?Drink plenty of water until 1 hour prior to the test. ?Do not eat any food 4 hours prior to the test. ?You may take your regular medications prior to the test.  ?Take metoprolol (Lopressor) two hours prior to test. ?HOLD Furosemide/Hydrochlorothiazide morning of the test ? ?     ?After the Test: ?Drink plenty of water. ?After receiving IV contrast, you may experience a mild flushed feeling. This is normal. ?On occasion, you may experience a mild rash up to 24  hours after the test. This is not dangerous. If this occurs, you can take Benadryl 25 mg and increase your fluid intake. ?If you experience trouble breathing, this can be serious. If it is severe call 911 IMMEDIATELY. If it is mild, please call our office. ?If you take any of these medications: Glipizide/Metformin, Avandament, Glucavance, please do not take 48 hours after completing test unless otherwise  instructed. ? ?We will call to schedule your test 2-4 weeks out understanding that some insurance companies will need an authorization prior to the service being performed.  ? ?For non-scheduling related questions, please contact the cardiac imaging nurse navigator should you have any questions/concerns: ?Marchia Bond, Cardiac Imaging Nurse Navigator ?Gordy Clement, Cardiac Imaging Nurse Navigator ?[facility] ?Direct Office Dial: [phone]  ? ?For scheduling needs, including cancellations and rescheduling, please call [place], [phone]. ? ? ? ? ?

## 2021-06-19 LAB — BASIC METABOLIC PANEL
BUN/Creatinine Ratio: 15 (ref 10–24)
BUN: 18 mg/dL (ref 8–27)
CO2: 21 mmol/L (ref 20–29)
Calcium: 9.9 mg/dL (ref 8.6–10.2)
Chloride: 102 mmol/L (ref 96–106)
Creatinine, Ser: 1.2 mg/dL (ref 0.76–1.27)
Glucose: 122 mg/dL — ABNORMAL HIGH (ref 70–99)
Potassium: 4.5 mmol/L (ref 3.5–5.2)
Sodium: 139 mmol/L (ref 134–144)
eGFR: 64 mL/min/{1.73_m2} (ref >59)

## 2021-06-20 ENCOUNTER — Other Ambulatory Visit: Payer: Self-pay | Admitting: *Deleted

## 2021-06-20 MED ORDER — METOPROLOL TARTRATE 100 MG PO TABS: ORAL_TABLET | ORAL | 0 refills | Status: DC

## 2021-06-20 NOTE — Progress Notes (Signed)
Patient called and notified that he should take 100 mg of Metoprolol Tartrate 2 hours before the cardiac ct, per Dr. Percival Spanish.  ?

## 2021-06-27 ENCOUNTER — Encounter: Payer: Self-pay | Admitting: *Deleted

## 2021-06-28 ENCOUNTER — Telehealth (HOSPITAL_COMMUNITY): Payer: Self-pay | Admitting: Emergency Medicine

## 2021-06-28 NOTE — Telephone Encounter (Signed)
Reaching out to patient to offer assistance regarding upcoming cardiac imaging study; pt verbalizes understanding of appt date/time, parking situation and where to check in, pre-test NPO status and medications ordered, and verified current allergies; name and call back number provided for further questions should they arise ?Marchia Bond RN Navigator Cardiac Imaging ?[facility] ?[phone] office ?[phone] cell ? ? ?'100mg'$  metoprolol tartrate  ?Arrival 800 ? ?

## 2021-07-01 ENCOUNTER — Telehealth (HOSPITAL_COMMUNITY): Payer: Self-pay | Admitting: *Deleted

## 2021-07-01 ENCOUNTER — Telehealth: Payer: Self-pay | Admitting: *Deleted

## 2021-07-01 ENCOUNTER — Ambulatory Visit (HOSPITAL_COMMUNITY)
Admission: RE | Admit: 2021-07-01 | Discharge: 2021-07-01 | Disposition: A | Discharge status: Home / Self Care | Payer: Medicare Other | Source: Ambulatory Visit | Attending: Cardiology | Admitting: Cardiology

## 2021-07-01 ENCOUNTER — Encounter (HOSPITAL_COMMUNITY): Payer: Self-pay

## 2021-07-01 ENCOUNTER — Encounter (HOSPITAL_COMMUNITY): Payer: Self-pay | Admitting: Cardiology

## 2021-07-01 DIAGNOSIS — I420 Dilated cardiomyopathy: Secondary | ICD-10-CM

## 2021-07-01 DIAGNOSIS — I493 Ventricular premature depolarization: Secondary | ICD-10-CM

## 2021-07-01 MED ORDER — NITROGLYCERIN 0.4 MG SL SUBL: 0.8000 mg | SUBLINGUAL_TABLET | Freq: Once | SUBLINGUAL | Status: DC

## 2021-07-01 NOTE — Progress Notes (Signed)
Pt arrived for CT Heart scan. BP 131/64 HR 95-105. Rhythm bigeminy/trigeminy will lots of unifocal ventricular ectopy. Pt was instructed not to take Metoprolol '100mg'$  prior to coming if pulse less than 70. Pt states his wife checked his pulse several times this morning prior to coming and his pulse was in the 50's. Dr. Aundra Dubin made aware and scan canceled. Marchia Bond, RN notified of cancellation.   ?

## 2021-07-01 NOTE — Telephone Encounter (Signed)
Patient given detailed instructions per Myocardial Perfusion Study Information Sheet for the test on 07/03/21 Patient notified to arrive 15 minutes early and that it is imperative to arrive on time for appointment to keep from having the test rescheduled. ? If you need to cancel or reschedule your appointment, please call the office within 24 hours of your appointment. . Patient verbalized understanding. Arthur Klein ? ? ?

## 2021-07-01 NOTE — Telephone Encounter (Signed)
-----   Message from Minus Breeding, MD sent at 07/01/2021  9:30 AM EDT ----- ?Let us do a [place] Myoview please.  Call and schedule.  ?----- Message ----- ?From: Lorenza Evangelist, RN ?Sent: 07/01/2021   8:36 AM EDT ?To: Minus Breeding, MD, Ricci Barker, RN ? ?Good morning Dr. Percival Spanish,  ? ?This patient came in for his scan today and his HR was too fast and had too much ectopy for a good scan. His appt was cancelled. Please follow up with him regarding next steps.  ? ?Thank you and sorry for the inconvenience ?Marchia Bond ?----- Message ----- ?From: Minus Breeding, MD ?Sent: 06/18/2021   5:50 PM EDT ?To: Lorenza Evangelist, RN ? ?Thanks..  I also let him know. ? ?----- Message ----- ?From: Lorenza Evangelist, RN ?Sent: 06/18/2021   2:24 PM EDT ?To: Minus Breeding, MD, Ricci Barker, RN ? ?This guy might be a challenge to get good images with all the PVCs/VT hes been having. We can try '100mg'$  metoprolol tart one time 2 hr prior to scan. When I call him to review instructions- I will make him aware that we will check his rhythm on arrival to the CT appt and we will decide whether or not to proceed. (Just want to set that expectation with the patient) ? ?We will give it the good old college try though.  ?Alford Highland ?----- Message ----- ?From: Ricci Barker, RN ?Sent: 06/18/2021   2:12 PM EDT ?To: Lorenza Evangelist, RN ? ?Hello Sarah, ? ?Dr. Percival Spanish has ordered a Cardiac CT on this patient. He has cardiomyopathy and ventricular ectopy. He wanted to reach out to you for advice on the beta blocker dosage.  ? ?Thank you, ?Lattie Haw ? ? ? ? ? ?

## 2021-07-01 NOTE — Telephone Encounter (Signed)
Spoke with pt, Aware of dr hochrein's recommendations.  ?Order placed  ?

## 2021-07-03 ENCOUNTER — Ambulatory Visit (HOSPITAL_COMMUNITY): Payer: Medicare Other | Attending: Cardiology

## 2021-07-03 DIAGNOSIS — I493 Ventricular premature depolarization: Secondary | ICD-10-CM | POA: Insufficient documentation

## 2021-07-03 LAB — MYOCARDIAL PERFUSION IMAGING
LV dias vol: 213 mL (ref 62–150)
LV sys vol: 171 mL
Nuc Stress EF: 19 %
Peak HR: 98 {beats}/min
Rest HR: 83 {beats}/min
Rest Nuclear Isotope Dose: 9.8 mCi
SDS: 8
SRS: 2
SSS: 11
ST Depression (mm): 0 mm
Stress Nuclear Isotope Dose: 31.4 mCi
TID: 1.07

## 2021-07-03 MED ORDER — TECHNETIUM TC 99M TETROFOSMIN IV KIT
31.4000 | PACK | Freq: Once | INTRAVENOUS | Status: AC | PRN
  Administered 2021-07-03: 31.4 via INTRAVENOUS
  Filled 2021-07-03: qty 32

## 2021-07-03 MED ORDER — TECHNETIUM TC 99M TETROFOSMIN IV KIT
9.8000 | PACK | Freq: Once | INTRAVENOUS | Status: AC | PRN
  Administered 2021-07-03: 9.8 via INTRAVENOUS
  Filled 2021-07-03: qty 10

## 2021-07-03 MED ORDER — REGADENOSON 0.4 MG/5ML IV SOLN
0.4000 mg | Freq: Once | INTRAVENOUS | Status: AC
  Administered 2021-07-03: 0.4 mg via INTRAVENOUS

## 2021-07-04 ENCOUNTER — Encounter: Payer: Self-pay | Admitting: Cardiology

## 2021-07-08 ENCOUNTER — Telehealth: Payer: Self-pay | Admitting: Cardiology

## 2021-07-08 DIAGNOSIS — I420 Dilated cardiomyopathy: Secondary | ICD-10-CM

## 2021-07-08 NOTE — Telephone Encounter (Signed)
Spoke with pt, follow up with dr hochrein moved up. ?

## 2021-07-08 NOTE — Telephone Encounter (Signed)
Called patient, spoke about having the ECHO completed- per [employer], Dr.Hochrein would like to have ECHO completed, and move up appointment with him to May.  ? ?Scheduler> please schedule ECHO ASAP- order is placed as STAT. ? ?Debra> I wanted to have the ECHO ordered first, and then move the appointment up to after this so we could make sure it was completed before the appointment with Dr.Hochrein, but do what works best for you in review of his schedule. ? ? ?Thanks! ?

## 2021-07-08 NOTE — Telephone Encounter (Signed)
Wife of patient called. She wanted to know how soon Dr. Percival Spanish wants the patient to have the Echocardiogram. The patient would like to go out of town. Please advise ?

## 2021-07-16 ENCOUNTER — Ambulatory Visit (HOSPITAL_COMMUNITY): Payer: Medicare Other | Attending: Internal Medicine

## 2021-07-16 DIAGNOSIS — I420 Dilated cardiomyopathy: Secondary | ICD-10-CM | POA: Diagnosis present

## 2021-07-16 LAB — ECHOCARDIOGRAM COMPLETE
AV Mean grad: 16.5 mmHg
AV Peak grad: 30.4 mmHg
Ao pk vel: 2.76 m/s
Area-P 1/2: 4.17 cm2
S' Lateral: 4.7 cm

## 2021-07-25 NOTE — Progress Notes (Signed)
  Cardiology Office Note   Date:  07/26/2021   ID:  Arthur Klein, DOB 12/12/1945, MRN [id number]  PCP:  Holwerda, Scott, MD  Cardiologist:   James Hochrein, MD Referring:  Holwerda, Scott, MD   Chief Complaint  Patient presents with   Palpitations      History of Present Illness: Arthur Klein is a 75 y.o. male who presents for evaluation of bradycardia.  Holwerda, Scott, MD .  The patient recently was noted to have palpitations and an abnormal EKG.  He previously had PVCs noted in 2017.  At that time he had an abnormal stress test suggesting reduced ejection fraction.  There was no ischemia or infarction.  A month later follow-up echo demonstrated EF of 50 - 55%.      In Dec 2022 Echo suggested that the EF was slightly lower than previous at 40 - 45% so I changed him to Entresto.  He has had some episodes of dizziness and had a monitor with runs of NSVT.  This was in Feb 2023.   I tried to have him gt a CT but he had too many PVCs.  He had a negative perfusion study.   He had a mildly reduced EF on echo.  His biggest complaint continues to be fatigue.  He does not really feel the palpitations.  He is not having any presyncope or syncope.  He falls asleep easily.  He says he can work hard without bringing on any cardiovascular symptoms.  He has not had any weight gain or edema.     Past Medical History:  Diagnosis Date   Arthritis    Complication of anesthesia    doesn't wake up well,combative until wife talks with him   Diabetes mellitus without complication (HCC)    diet controlled    Esophageal reflux    Gout    hx of   H/O hiatal hernia    History of kidney stones    Hypertension    borderline HTNt   Plantar fasciitis    no problems now   PONV (postoperative nausea and vomiting)    Prostate cancer (HCC) 08/27/2012   gleason 6, volume 59.3 cc   PVC (premature ventricular contraction)    hx of    Skin cancer 2011   melanoma on back, resected    Undescended left  testes     Past Surgical History:  Procedure Laterality Date   CYSTOSCOPY WITH RETROGRADE PYELOGRAM, URETEROSCOPY AND STENT PLACEMENT Left 02/18/2016   Procedure: CYSTOSCOPY WITH LEFT RETROGRADE PYELOGRAM, URETEROSCOPY  AND URETERAL  STENT PLACEMENT;  Surgeon: Lester Borden, MD;  Location: [facility];  Service: Urology;  Laterality: Left;   HERNIA REPAIR     INGUINAL HERNIA REPAIR Bilateral 03/16/2015   Procedure: LAPAROSCOPIC REPAIR OF BILATERAL FEMEROL AND  BILATERAL INGUINAL HERNIAS WITH MESH;  Surgeon: Steven Gross, MD;  Location: [facility];  Service: General;  Laterality: Bilateral;   INSERTION OF MESH Right 03/16/2015   Procedure: INSERTION OF MESH;  Surgeon: Steven Gross, MD;  Location: [facility];  Service: General;  Laterality: Right;   KNEE ARTHROPLASTY  05/02/2011   Procedure: COMPUTER ASSISTED TOTAL KNEE ARTHROPLASTY;  Surgeon: John L Graves, MD;  [facility];  Service: Orthopedics;  Laterality: Right;   KNEE ARTHROSCOPY     right   LAPAROSCOPIC LYSIS OF ADHESIONS  03/16/2015   Procedure: LAPAROSCOPIC LYSIS OF ADHESIONS;  Surgeon: Steven Gross, MD;  [facility]: [facility];  Service: General;;   MELANOMA EXCISION       stage 3+ lower left lumbar region with axillary node removal   MOHS SURGERY     mose surgery     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      amoxicillin (AMOXIL) 500 MG capsule Take 2,000 mg by mouth as directed. Take 4 capsules (2000 mg) by prior to  dental appointment.  4   B Complex Vitamins (VITAMIN B COMPLEX) TABS Take 1 tablet by mouth daily as needed (low energy).     B-D UF III MINI PEN NEEDLES 31G X 5 MM MISC See admin instructions.     BAYER CONTOUR NEXT TEST test strip 1 each by Other route daily.   2   BAYER MICROLET LANCETS lancets 1 each by Other route daily.   2   Blood Glucose Monitoring Suppl (CONTOUR NEXT EZ MONITOR) w/Device KIT 1 Device by Other route daily.   0   chlorhexidine (PERIDEX) 0.12 % solution SMARTSIG:1 Capful(s) By Mouth Twice Daily     Cholecalciferol (VITAMIN D-3) 1000 UNITS CAPS Take 1,000 Units by mouth daily.      colchicine 0.6 MG tablet      diphenhydrAMINE (BENADRYL) 12.5 MG/5ML elixir Take 3.125 mg by mouth 4 (four) times daily as needed (for allergies.).     Insulin Pen Needle (PEN NEEDLES) 31G X 6 MM MISC use for ozempic as directed. ICD10: E11.65     metoprolol succinate (TOPROL XL) 25 MG 24 hr tablet Take 1 tablet (25 mg total) by mouth daily. 90 tablet 3   Multiple Vitamins-Minerals (ICAPS AREDS 2 PO) Take 1 capsule by mouth 2 (two) times daily.     Nutritional Supplements (JUICE PLUS FIBRE PO) Take 2 capsules by mouth daily.     Probiotic Product (PROBIOTIC DAILY PO) Take 1 tablet by mouth daily.     sacubitril-valsartan (ENTRESTO) 24-26 MG Take 1 tablet by mouth 2 (two) times daily. 60 tablet 11   Semaglutide,0.25 or 0.5MG/DOS, (OZEMPIC, 0.25 OR 0.5 MG/DOSE,) 2 MG/1.5ML SOPN inject 0.86m once per week x 1 month, then advance to 0.587monce per week. ICD10 : E11.65     No current facility-administered medications for this visit.    Allergies:   Erythrocin, Erythromycin, Lisinopril, Nsaids, and Oxycodone    ROS:  Please see the history of present illness.   Otherwise, review of systems are positive for none.   All other systems are reviewed and negative.    PHYSICAL EXAM: VS:  BP 120/80 (BP Location: Left Arm)   Pulse 84   Ht 5' 8.5" (1.74 m)   Wt 207 lb (93.9 kg)   SpO2 97%   BMI 31.02  kg/m  , BMI Body mass index is 31.02 kg/m. GENERAL:  Well appearing NECK:  No jugular venous distention, waveform within normal limits, carotid upstroke brisk and symmetric, no bruits, no thyromegaly LUNGS:  Clear to auscultation bilaterally CHEST:  Unremarkable HEART:  PMI not displaced or sustained,S1 and S2 within normal limits, no S3, no S4, no clicks, no rubs, soft apical systolic murmur radiating slightly at the aortic outflow tract, no diastolic murmurs ABD:  Flat, positive bowel sounds normal in frequency in pitch, no bruits, no rebound, no guarding, no midline pulsatile mass, no hepatomegaly, no splenomegaly EXT:  2 plus pulses throughout, no edema, no cyanosis no clubbing'   EKG:  EKG is not ordered today. NA    Recent Labs: 02/14/2021: TSH 2.340 06/18/2021: BUN 18; Creatinine, Ser 1.20; Potassium 4.5; Sodium 139    Lipid Panel No results found for: CHOL, TRIG, HDL, CHOLHDL, VLDL, LDLCALC, LDLDIRECT    Wt Readings from Last 3 Encounters:  07/26/21 207 lb (93.9 kg)  07/03/21 203 lb (92.1 kg)  06/18/21 203 lb (92.1 kg)      Other studies Reviewed: Additional studies/ records that were reviewed today include:  Monitor, labs Review of the above records demonstrates:  Please see elsewhere in the note.     ASSESSMENT AND PLAN:  PVCs:   He has had an extensive work-up now with no suggestion of ischemia.  His EF is mildly reduced.  At this point I will refer him to EP for consideration of ablation.  He has greater than 25% burden of PVCs.  This could be contributing to the mildly reduced ejection fraction.  I will try Toprol-XL 25 mg daily.  His wife was concerned about this because they recorded his heart rate to be low but this is because of the ectopy.  Monitor never demonstrated him to have a heart rate below 50.   REDUCED EF:   At this point he will continue the meds as listed.  He is tolerating the current dose of Entresto.  Because of some lightheadedness I will avoid  further med titration at this point.    DM: His A1c is 6.9.  No change in therapy.   Current medicines are reviewed at length with the patient today.  The patient does not have concerns regarding medicines.  The following changes have been made: As above  Labs/ tests ordered today include:   Orders Placed This Encounter  Procedures   Ambulatory referral to Cardiac Electrophysiology      Disposition:   FU with me after the EP consultation.    Signed, James Hochrein, MD  07/26/2021 1:20 PM    [facility]     

## 2021-07-26 ENCOUNTER — Encounter: Payer: Self-pay | Admitting: Cardiology

## 2021-07-26 ENCOUNTER — Ambulatory Visit: Payer: Medicare Other | Admitting: Cardiology

## 2021-07-26 VITALS — BP 120/80 | HR 84 | Ht 68.5 in | Wt 207.0 lb

## 2021-07-26 DIAGNOSIS — I493 Ventricular premature depolarization: Secondary | ICD-10-CM | POA: Diagnosis not present

## 2021-07-26 DIAGNOSIS — I429 Cardiomyopathy, unspecified: Secondary | ICD-10-CM | POA: Diagnosis not present

## 2021-07-26 MED ORDER — METOPROLOL SUCCINATE ER 25 MG PO TB24: 25.0000 mg | ORAL_TABLET | Freq: Every day | ORAL | 3 refills | Status: DC

## 2021-07-26 NOTE — Patient Instructions (Addendum)
Medication Instructions:  Your physician has recommended you make the following change in your medication: START: Metoprolol succinate (Toprol- XL) 25 mg once daily *If you need a refill on your cardiac medications before your next appointment, please call your pharmacy*   Lab Work: None If you have labs (blood work) drawn today and your tests are completely normal, you will receive your results only by: [place] (if you have MyChart) OR A paper copy in the mail If you have any lab test that is abnormal or we need to change your treatment, we will call you to review the results.   Testing/Procedures: None   Follow-Up: At [facility], you and your health needs are our priority.  As part of our continuing mission to provide you with exceptional heart care, we have created designated Provider Care Teams.  These Care Teams include your primary Cardiologist (physician) and Advanced Practice Providers (APPs -  Physician Assistants and Nurse Practitioners) who all work together to provide you with the care you need, when you need it.  We recommend signing up for the patient portal called "MyChart".  Sign up information is provided on this After Visit Summary.  MyChart is used to connect with patients for Virtual Visits (Telemedicine).  Patients are able to view lab/test results, encounter notes, upcoming appointments, etc.  Non-urgent messages can be sent to your provider as well.   To learn more about what you can do with MyChart, go to [email or web address].    Your next appointment:   1 month(s) (After EP)   The format for your next appointment:   In Person  Provider:   Minus Breeding, MD     Other Instructions   Important Information About Sugar

## 2021-08-26 ENCOUNTER — Ambulatory Visit: Payer: Medicare Other | Admitting: Cardiology

## 2021-08-26 ENCOUNTER — Encounter: Payer: Self-pay | Admitting: Cardiology

## 2021-08-26 VITALS — BP 120/76 | HR 91 | Ht 68.5 in | Wt 205.0 lb

## 2021-08-26 DIAGNOSIS — I493 Ventricular premature depolarization: Secondary | ICD-10-CM

## 2021-08-26 MED ORDER — FLECAINIDE ACETATE 100 MG PO TABS: 100.0000 mg | ORAL_TABLET | Freq: Two times a day (BID) | ORAL | 3 refills | Status: DC

## 2021-09-10 ENCOUNTER — Ambulatory Visit (INDEPENDENT_AMBULATORY_CARE_PROVIDER_SITE_OTHER): Payer: Medicare Other

## 2021-09-10 VITALS — BP 126/84 | HR 87 | Ht 69.0 in | Wt 198.0 lb

## 2021-09-10 DIAGNOSIS — I493 Ventricular premature depolarization: Secondary | ICD-10-CM

## 2021-09-10 DIAGNOSIS — Z79899 Other long term (current) drug therapy: Secondary | ICD-10-CM

## 2021-09-10 NOTE — Progress Notes (Signed)
   Nurse Visit   Date of Encounter: 09/10/2021 ID: Les Pou, DOB [birth year], MRN [id number]  PCP:  Velna Hatchet, MD   [facility] Providers Cardiologist:  Minus Breeding, MD {    Visit Details   VS: See Rooming information for vitals.  EKG has been placed for scanning.  Pt reports he is doing well on Flecainide.  He has had a couple episodes of lightheadedness on standing since starting medication.  He reports lightheadedness resolved quickly.  Wt Readings from Last 3 Encounters:  08/26/21 205 lb (93 kg)  07/26/21 207 lb (93.9 kg)  07/03/21 203 lb (92.1 kg)     Reason for visit: EKG for Flecainide start Performed today: Vitals, Education, EKG and consulted with Dr Gwyndolyn Kaufman Changes (medications, testing, etc.) : No changes Length of Visit: 25 minutes    Medications Adjustments/Labs and Tests Ordered: None ordered.  Discussed changing positions slowly with pt.  Pt advised to continue Flecainide and keep scheduled follow up with Dr Curt Bears per Dr Johney Frame.    Signed, Thora Lance, RN  09/10/2021 11:25 AM

## 2021-09-10 NOTE — Patient Instructions (Signed)
Medication Instructions:  Your physician recommends that you continue on your current medications as directed. Please refer to the Current Medication list given to you today.  *If you need a refill on your cardiac medications before your next appointment, please call your pharmacy*   Lab Work: None ordered.  If you have labs (blood work) drawn today and your tests are completely normal, you will receive your results only by: [place] (if you have MyChart) OR A paper copy in the mail If you have any lab test that is abnormal or we need to change your treatment, we will call you to review the results.   Testing/Procedures: None ordered.    Follow-Up: At [facility], you and your health needs are our priority.  As part of our continuing mission to provide you with exceptional heart care, we have created designated Provider Care Teams.  These Care Teams include your primary Cardiologist (physician) and Advanced Practice Providers (APPs -  Physician Assistants and Nurse Practitioners) who all work together to provide you with the care you need, when you need it.  We recommend signing up for the patient portal called "MyChart".  Sign up information is provided on this After Visit Summary.  MyChart is used to connect with patients for Virtual Visits (Telemedicine).  Patients are able to view lab/test results, encounter notes, upcoming appointments, etc.  Non-urgent messages can be sent to your provider as well.   To learn more about what you can do with MyChart, go to [email or web address].    Your next appointment:   Follow up as scheduled with Dr Curt Bears  Important Information About Sugar

## 2021-09-23 ENCOUNTER — Institutional Professional Consult (permissible substitution): Payer: Medicare Other | Admitting: Cardiology

## 2021-10-10 NOTE — Progress Notes (Signed)
Cardiology Office Note   Date:  10/11/2021   ID:  Arthur Klein, DOB [birth year], MRN [id number]  PCP:  Velna Hatchet, MD  Cardiologist:   Minus Breeding, MD Referring:  Velna Hatchet, MD   Chief Complaint  Patient presents with   PVCs      History of Present Illness: Arthur Klein is a 76 y.o. male who presents for evaluation of bradycardia.  Velna Hatchet, MD .  The patient recently was noted to have palpitations and an abnormal EKG.  He previously had PVCs noted in 2017.  At that time he had an abnormal stress test suggesting reduced ejection fraction.  There was no ischemia or infarction.  A month later follow-up echo demonstrated EF of 50 - 55%.      In Dec 2022 Echo suggested that the EF was slightly lower than previous at 40 - 45% so I changed him to [place].  He has had some episodes of dizziness and had a monitor with runs of NSVT.  This was in Feb 2023.   He had a perfusion study to rule out obstructive CAD.  He had no ischemia.  He did have an appt with Dr. Curt Bears as he had a high burden of ventricular ectopy on a monitor and is now started on flecainide.    When he came back to the EP he had no ectopy on EKG.  He has not felt this palpitations and has not had any presyncope or syncope.  He is having less dizziness.  He climbed a ladder the other day and felt that that was a mechanical fall.  He is not having any new chest pressure, neck or arm discomfort.  He is not having any new shortness of breath, PND or orthopnea.  He has had no weight gain or edema.   Past Medical History:  Diagnosis Date   Arthritis    Complication of anesthesia    doesn't wake up well,combative until wife talks with him   Diabetes mellitus without complication ([place])    diet controlled    Esophageal reflux    Gout    hx of   H/O hiatal hernia    History of kidney stones    Hypertension    borderline HTNt   Plantar fasciitis    no problems now   PONV (postoperative nausea and  vomiting)    Prostate cancer ([place]) 08/27/2012   gleason 6, volume 59.3 cc   PVC (premature ventricular contraction)    hx of    Skin cancer 2011   melanoma on back, resected    Undescended left testes     Past Surgical History:  Procedure Laterality Date   CYSTOSCOPY WITH RETROGRADE PYELOGRAM, URETEROSCOPY AND STENT PLACEMENT Left 02/18/2016   Procedure: CYSTOSCOPY WITH LEFT RETROGRADE PYELOGRAM, URETEROSCOPY  AND URETERAL  STENT PLACEMENT;  Surgeon: Raynelle Bring, MD;  [facility]: [facility];  Service: Urology;  Laterality: Left;   HERNIA REPAIR     INGUINAL HERNIA REPAIR Bilateral 03/16/2015   Procedure: LAPAROSCOPIC REPAIR OF BILATERAL FEMEROL AND  BILATERAL INGUINAL HERNIAS WITH MESH;  Surgeon: Michael Boston, MD;  [facility]: [facility];  Service: General;  Laterality: Bilateral;   INSERTION OF MESH Right 03/16/2015   Procedure: INSERTION OF MESH;  Surgeon: Michael Boston, MD;  [facility];  Service: General;  Laterality: Right;   KNEE ARTHROPLASTY  05/02/2011   Procedure: COMPUTER ASSISTED TOTAL KNEE ARTHROPLASTY;  Surgeon: Alta Corning, MD;  Location: [facility];  Service: Orthopedics;  Laterality: Right;   KNEE ARTHROSCOPY     right   LAPAROSCOPIC LYSIS OF ADHESIONS  03/16/2015   Procedure: LAPAROSCOPIC LYSIS OF ADHESIONS;  Surgeon: Michael Boston, MD;  [facility];  Service: General;;   MELANOMA EXCISION     stage 3+ lower left lumbar region with axillary node removal   MOHS SURGERY     mose surgery     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      B Complex Vitamins (VITAMIN B COMPLEX) TABS Take 1 tablet by mouth daily as needed (low energy).     B-D UF III MINI PEN NEEDLES 31G X 5 MM MISC See admin instructions.     BAYER CONTOUR NEXT TEST test strip 1 each by Other route daily.   2   BAYER MICROLET LANCETS lancets 1 each by Other route daily.   2   Blood Glucose Monitoring Suppl (CONTOUR NEXT EZ MONITOR) w/Device KIT 1 Device by Other route daily.   0   chlorhexidine (PERIDEX) 0.12 % solution SMARTSIG:1 Capful(s) By Mouth Twice Daily     Cholecalciferol (VITAMIN D-3) 1000 UNITS CAPS Take 1,000 Units by mouth daily.      diphenhydrAMINE (BENADRYL) 12.5 MG/5ML elixir Take 3.125 mg by mouth 4 (four) times daily as needed (for allergies.).     flecainide (TAMBOCOR) 100 MG tablet Take 1 tablet (100 mg total) by mouth 2 (two) times daily. 60 tablet 3   Insulin Pen Needle (PEN NEEDLES) 31G X 6 MM MISC use for ozempic as directed. ICD10: E11.65     Multiple Vitamins-Minerals (ICAPS AREDS 2 PO) Take 1 capsule by mouth 2 (two) times daily.     Nutritional Supplements (JUICE PLUS FIBRE PO) Take 2 capsules by mouth daily.     Probiotic Product (PROBIOTIC DAILY PO) Take 1 tablet by mouth daily.     sacubitril-valsartan (ENTRESTO) 49-51 MG Take 1 tablet by mouth 2 (two) times daily. 90 tablet 3   Semaglutide,0.25 or 0.5MG/DOS, (OZEMPIC, 0.25 OR 0.5 MG/DOSE,) 2 MG/1.5ML SOPN inject 0.55m once per week x 1 month, then advance to 0.557monce per week. ICD10 : E11.65     amoxicillin (AMOXIL) 500 MG capsule Take 2,000 mg by mouth as directed. Take 4 capsules (2000 mg) by prior to dental appointment. (Patient not taking: Reported on 10/11/2021)  4   colchicine 0.6 MG tablet  (Patient not taking: Reported on 10/11/2021)     No current facility-administered medications for this visit.    Allergies:   Erythrocin, Erythromycin, Lisinopril, Nsaids, and Oxycodone    ROS:   Please see the history of present illness.   Otherwise, review of systems are positive for none.   All other systems are reviewed and negative.    PHYSICAL EXAM: VS:  BP 129/88   Pulse 79   Ht 5' 8"  (1.727 m)   Wt 206 lb (93.4  kg)   SpO2 97%   BMI 31.32 kg/m  , BMI Body mass index is 31.32 kg/m. GENERAL:  Well appearing NECK:  No jugular venous distention, waveform within normal limits, carotid upstroke brisk and symmetric, no bruits, no thyromegaly LUNGS:  Clear to auscultation bilaterally CHEST:  Unremarkable HEART:  PMI not displaced or sustained,S1 and S2 within normal limits, no S3, no S4, no clicks, no rubs, very brief apical systolic murmur nonradiating, no diastolic murmurs ABD:  Flat, positive bowel sounds normal in frequency in pitch, no bruits, no rebound, no guarding, no midline pulsatile mass, no hepatomegaly, no splenomegaly EXT:  2 plus pulses throughout, no edema, no cyanosis no clubbing   EKG:  EKG is not ordered today.    Recent Labs: 02/14/2021: TSH 2.340 06/18/2021: BUN 18; Creatinine, Ser 1.20; Potassium 4.5; Sodium 139    Lipid Panel No results found for: "CHOL", "TRIG", "HDL", "CHOLHDL", "VLDL", "LDLCALC", "LDLDIRECT"    Wt Readings from Last 3 Encounters:  10/11/21 206 lb (93.4 kg)  09/10/21 198 lb (89.8 kg)  08/26/21 205 lb (93 kg)      Other studies Reviewed: Additional studies/ records that were reviewed today include:  EP records Review of the above records demonstrates:  Please see elsewhere in the note.     ASSESSMENT AND PLAN:  PVCs:   He is on flecainide.  I will leave him on this and wait for Dr. Curt Bears to consider follow-up monitoring when he sees him in 3 months.   REDUCED EF: I will increase his Entresto to 49/51 twice daily.  I do not think he will tolerate increased beta-blocker because of his previous bradycardia.  DM: His A1c is 6.3.  No change in therapy.  Current medicines are reviewed at length with the patient today.   The patient does not have concerns regarding medicines.  The following changes have been made: As above  Labs/ tests ordered today include: None  No orders of the defined types were placed in this encounter.     Disposition:   FU with me in 3 months   Signed, Minus Breeding, MD  10/11/2021 10:22 AM    [facility]

## 2021-10-11 ENCOUNTER — Ambulatory Visit: Payer: Medicare Other | Admitting: Cardiology

## 2021-10-11 ENCOUNTER — Encounter: Payer: Self-pay | Admitting: Cardiology

## 2021-10-11 VITALS — BP 129/88 | HR 79 | Ht 68.0 in | Wt 206.0 lb

## 2021-10-11 DIAGNOSIS — I429 Cardiomyopathy, unspecified: Secondary | ICD-10-CM

## 2021-10-11 DIAGNOSIS — E118 Type 2 diabetes mellitus with unspecified complications: Secondary | ICD-10-CM

## 2021-10-11 DIAGNOSIS — I493 Ventricular premature depolarization: Secondary | ICD-10-CM | POA: Diagnosis not present

## 2021-10-11 DIAGNOSIS — I420 Dilated cardiomyopathy: Secondary | ICD-10-CM

## 2021-10-11 MED ORDER — ENTRESTO 49-51 MG PO TABS: 1.0000 | ORAL_TABLET | Freq: Two times a day (BID) | ORAL | 3 refills | Status: DC

## 2021-10-11 NOTE — Patient Instructions (Signed)
Medication Instructions:  INCREASE: Entresto to 49-51 mg twice a day  *If you need a refill on your cardiac medications before your next appointment, please call your pharmacy*   Lab Work: None ordered today   Testing/Procedures: None ordered today   Follow-Up: At [facility], you and your health needs are our priority.  As part of our continuing mission to provide you with exceptional heart care, we have created designated Provider Care Teams.  These Care Teams include your primary Cardiologist (physician) and Advanced Practice Providers (APPs -  Physician Assistants and Nurse Practitioners) who all work together to provide you with the care you need, when you need it.  We recommend signing up for the patient portal called "MyChart".  Sign up information is provided on this After Visit Summary.  MyChart is used to connect with patients for Virtual Visits (Telemedicine).  Patients are able to view lab/test results, encounter notes, upcoming appointments, etc.  Non-urgent messages can be sent to your provider as well.   To learn more about what you can do with MyChart, go to [email or web address].    Your next appointment:   3 month(s)  The format for your next appointment:   In Person  Provider:   Minus Breeding, MD {

## 2021-11-19 ENCOUNTER — Other Ambulatory Visit: Payer: Self-pay | Admitting: Orthopaedic Surgery

## 2021-11-19 DIAGNOSIS — M25512 Pain in left shoulder: Secondary | ICD-10-CM

## 2021-12-02 ENCOUNTER — Ambulatory Visit: Payer: Medicare Other

## 2021-12-02 ENCOUNTER — Ambulatory Visit: Payer: Medicare Other | Attending: Cardiology | Admitting: Cardiology

## 2021-12-02 ENCOUNTER — Encounter: Payer: Self-pay | Admitting: Cardiology

## 2021-12-02 ENCOUNTER — Ambulatory Visit: Payer: Medicare Other | Attending: Cardiology

## 2021-12-02 VITALS — BP 132/82 | HR 86 | Ht 68.5 in | Wt 203.0 lb

## 2021-12-02 DIAGNOSIS — I493 Ventricular premature depolarization: Secondary | ICD-10-CM | POA: Diagnosis not present

## 2021-12-02 DIAGNOSIS — I5022 Chronic systolic (congestive) heart failure: Secondary | ICD-10-CM

## 2021-12-02 NOTE — Patient Instructions (Addendum)
Medication Instructions:  Your physician recommends that you continue on your current medications as directed. Please refer to the Current Medication list given to you today.  *If you need a refill on your cardiac medications before your next appointment, please call your pharmacy*   Lab Work: None ordered    Testing/Procedures:                           ZIO XT- Long Term Monitor Instructions  Your physician has requested you wear a ZIO patch monitor for 7 days.  This is a single patch monitor. [employer] supplies one patch monitor per enrollment. Additional stickers are not available. Please do not apply patch if you will be having a Nuclear Stress Test,  Echocardiogram, Cardiac CT, MRI, or Chest Xray during the period you would be wearing the  monitor. The patch cannot be worn during these tests. You cannot remove and re-apply the  ZIO XT patch monitor.  Your ZIO patch monitor will be mailed 3 day USPS to your address on file. It may take 3-5 days  to receive your monitor after you have been enrolled.  Once you have received your monitor, please review the enclosed instructions. Your monitor  has already been registered assigning a specific monitor serial # to you.  Billing and Patient Assistance Program Information  We have supplied [employer] with any of your insurance information on file for billing purposes. [employer] offers a sliding scale Patient Assistance Program for patients that do not have  insurance, or whose insurance does not completely cover the cost of the ZIO monitor.  You must apply for the Patient Assistance Program to qualify for this discounted rate.  To apply, please call [employer] at [phone], select option 4, select option 2, ask to apply for  Patient Assistance Program. Theodore Demark will ask your household income, and how many people  are in your household. They will quote your out-of-pocket cost based on that information.  [employer] will also be able to set up a  55-month interest-free payment plan if needed.  Applying the monitor   Shave hair from upper left chest.  Hold abrader disc by orange tab. Rub abrader in 40 strokes over the upper left chest as  indicated in your monitor instructions.  Clean area with 4 enclosed alcohol pads. Let dry.  Apply patch as indicated in monitor instructions. Patch will be placed under collarbone on left  side of chest with arrow pointing upward.  Rub patch adhesive wings for 2 minutes. Remove white label marked "1". Remove the white  label marked "2". Rub patch adhesive wings for 2 additional minutes.  While looking in a mirror, press and release button in center of patch. A small green light will  flash 3-4 times. This will be your only indicator that the monitor has been turned on.  Do not shower for the first 24 hours. You may shower after the first 24 hours.  Press the button if you feel a symptom. You will hear a small click. Record Date, Time and  Symptom in the Patient Logbook.  When you are ready to remove the patch, follow instructions on the last 2 pages of Patient  Logbook. Stick patch monitor onto the last page of Patient Logbook.  Place Patient Logbook in the blue and white box. Use locking tab on box and tape box closed  securely. The blue and white box has prepaid postage on it. Please place it in  the mailbox as  soon as possible. Your physician should have your test results approximately 7 days after the  monitor has been mailed back to [facility].  Call [facility] at [phone] if you have questions regarding  your ZIO XT patch monitor. Call them immediately if you see an orange light blinking on your  monitor.  If your monitor falls off in less than 4 days, contact our Monitor department at [phone].  If your monitor becomes loose or falls off after 4 days call [employer] at [phone] for  suggestions on securing your monitor     Follow-Up: At [facility], you and your health needs are our priority.  As part of our continuing mission to provide you with exceptional heart care, we have created designated Provider Care Teams.  These Care Teams include your primary Cardiologist (physician) and Advanced Practice Providers (APPs -  Physician Assistants and Nurse Practitioners) who all work together to provide you with the care you need, when you need it.  Your next appointment:   To be  determined after monitor is complete and reviewed by MD  The format for your next appointment:   In Person  Provider:   Allegra Lai, MD    Thank you for choosing [facility]!!   Trinidad Curet, RN [phone]  Other Instructions   Important Information About Sugar

## 2021-12-02 NOTE — Progress Notes (Signed)
Electrophysiology Office Note   Date:  12/02/2021   ID:  Arthur Klein, DOB [birth year], MRN [id number]  PCP:  Alysia Penna, MD  Cardiologist:  [facility] Primary Electrophysiologist:  Mossie Gilder Jorja Loa, MD    Chief Complaint: PVC   History of Present Illness: Arthur Klein is a 76 y.o. male who is being seen today for the evaluation of PVC at the request of Alysia Penna, MD. Presenting today for electrophysiology evaluation.  He has a history seen for diabetes and PVCs.  He went to his primary physician's office and was noted to be bradycardic.  Subsequent ECG shows PVCs.  He had an echo in 2017 that showed a normal ejection fraction.  Repeat echo showed an EF of 40 to 45%.  He was started on Entresto.  He has weakness, fatigue, shortness of breath.  He had been taking more naps than usual.  At his last visit he was started on flecainide.  Today, denies symptoms of palpitations, chest pain, orthopnea, PND, lower extremity edema, claudication, dizziness, presyncope, syncope, bleeding, or neurologic sequela. The patient is tolerating medications without difficulties.  Unfortunately is continued to have fatigue and shortness of breath.  He is able to do all of his daily activities, but has noted that he is having to do them more slowly and is sleeping or resting more often.  He did have a fall off of a ladder that sounds to be a mechanical fall, and he has changed his activities due to this.  Past Medical History:  Diagnosis Date   Arthritis    Complication of anesthesia    doesn't wake up well,combative until wife talks with him   Diabetes mellitus without complication (HCC)    diet controlled    Esophageal reflux    Gout    hx of   H/O hiatal hernia    History of kidney stones    Hypertension    borderline HTNt   Plantar fasciitis    no problems now   PONV (postoperative nausea and vomiting)    Prostate cancer (HCC) 08/27/2012   gleason 6, volume 59.3 cc   PVC (premature  ventricular contraction)    hx of    Skin cancer 2011   melanoma on back, resected    Undescended left testes    Past Surgical History:  Procedure Laterality Date   CYSTOSCOPY WITH RETROGRADE PYELOGRAM, URETEROSCOPY AND STENT PLACEMENT Left 02/18/2016   Procedure: CYSTOSCOPY WITH LEFT RETROGRADE PYELOGRAM, URETEROSCOPY  AND URETERAL  STENT PLACEMENT;  Surgeon: Heloise Purpura, MD;  Location: [facility];  Service: Urology;  Laterality: Left;   HERNIA REPAIR     INGUINAL HERNIA REPAIR Bilateral 03/16/2015   Procedure: LAPAROSCOPIC REPAIR OF BILATERAL FEMEROL AND  BILATERAL INGUINAL HERNIAS WITH MESH;  Surgeon: Karie Soda, MD;  Location: [facility];  Service: General;  Laterality: Bilateral;   INSERTION OF MESH Right 03/16/2015   Procedure: INSERTION OF MESH;  Surgeon: Karie Soda, MD;  Location: [facility];  Service: General;  Laterality: Right;   KNEE ARTHROPLASTY  05/02/2011   Procedure: COMPUTER ASSISTED TOTAL KNEE ARTHROPLASTY;  Surgeon: Harvie Junior, MD;  Location: [facility];  Service: Orthopedics;  Laterality: Right;   KNEE ARTHROSCOPY     right   LAPAROSCOPIC LYSIS OF ADHESIONS  03/16/2015   Procedure: LAPAROSCOPIC LYSIS OF ADHESIONS;  Surgeon: Karie Soda, MD;  Location: [facility];  Service: General;;   MELANOMA EXCISION     stage 3+ lower left lumbar region with axillary node removal  MOHS SURGERY     mose surgery     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       amoxicillin (AMOXIL) 500 MG capsule Take 2,000 mg by mouth as directed. Take 4 capsules (2000 mg) by prior to dental appointment.  4   B Complex Vitamins (VITAMIN B COMPLEX) TABS Take 1 tablet by mouth daily as needed (low energy).     B-D UF III MINI PEN NEEDLES 31G X 5 MM MISC See admin instructions.     BAYER CONTOUR NEXT TEST test strip 1 each by Other route daily.   2   BAYER MICROLET LANCETS lancets 1 each by Other route daily.   2   Blood Glucose Monitoring Suppl (CONTOUR NEXT EZ MONITOR) w/Device KIT 1 Device by Other route daily.   0   chlorhexidine (PERIDEX) 0.12 % solution SMARTSIG:1 Capful(s) By Mouth Twice Daily     Cholecalciferol (VITAMIN D-3) 1000 UNITS CAPS Take 1,000 Units by mouth daily.      colchicine 0.6 MG tablet      diphenhydrAMINE (BENADRYL) 12.5 MG/5ML elixir Take 3.125 mg by mouth 4 (four) times daily as needed (for allergies.).     flecainide (TAMBOCOR) 100 MG tablet Take 1 tablet (100 mg total) by mouth 2 (two) times daily. 60 tablet 3   Insulin Pen Needle (PEN NEEDLES) 31G X 6 MM MISC use for ozempic as directed. ICD10: E11.65     Multiple Vitamins-Minerals (ICAPS AREDS 2 PO) Take 1 capsule by mouth 2 (two) times daily.     Nutritional Supplements (JUICE PLUS FIBRE PO) Take 2 capsules by mouth daily.     Probiotic Product (PROBIOTIC DAILY PO) Take 1 tablet by mouth daily.     sacubitril-valsartan (ENTRESTO) 49-51 MG Take 1 tablet by mouth 2 (two) times daily. 90 tablet 3   Semaglutide,0.25 or 0.5MG /DOS, (OZEMPIC, 0.25 OR 0.5 MG/DOSE,) 2 MG/1.5ML SOPN inject 0.5mg  once per week x 1 month, then advance to 0.5mg  once per week. ICD10 : E11.65     No current facility-administered medications for this visit.    Allergies:   Erythrocin, Erythromycin, Lisinopril, Nsaids, and Oxycodone   Social History:  The patient  reports that he has never smoked. His smokeless tobacco use includes chew. He reports that he does not drink alcohol and does not use drugs.   Family  History:  The patient's family history includes Cancer in his father; Cancer (age of onset: 28) in his brother; Congestive Heart Failure in his mother; Diabetes in his father and mother; Hypertension in his father and mother; Stroke in his father.    ROS:  Please see the history of present illness.   Otherwise, review of systems is positive for none.   All other systems are reviewed and negative.   PHYSICAL EXAM: VS:  BP 132/82   Pulse 86   Ht 5' 8.5" (1.74 m)  Wt 203 lb (92.1 kg)   SpO2 97%   BMI 30.42 kg/m  , BMI Body mass index is 30.42 kg/m. GEN: Well nourished, well developed, in no acute distress  HEENT: normal  Neck: no JVD, carotid bruits, or masses Cardiac: RRR; no murmurs, rubs, or gallops,no edema  Respiratory:  clear to auscultation bilaterally, normal work of breathing GI: soft, nontender, nondistended, + BS MS: no deformity or atrophy  Skin: warm and dry Neuro:  Strength and sensation are intact Psych: euthymic mood, full affect  EKG:  EKG is ordered today. Personal review of the ekg ordered shows this rhythm, PVCs  Recent Labs: 02/14/2021: TSH 2.340 06/18/2021: BUN 18; Creatinine, Ser 1.20; Potassium 4.5; Sodium 139    Lipid Panel  No results found for: "CHOL", "TRIG", "HDL", "CHOLHDL", "VLDL", "LDLCALC", "LDLDIRECT"   Wt Readings from Last 3 Encounters:  12/02/21 203 lb (92.1 kg)  10/11/21 206 lb (93.4 kg)  09/10/21 198 lb (89.8 kg)      Other studies Reviewed: Additional studies/ records that were reviewed today include: TTE 07/16/21  Review of the above records today demonstrates:   1. Left ventricular ejection fraction, by estimation, is 40 to 45%. Left  ventricular ejection fraction by PLAX is 41 %. The left ventricle has  mildly decreased function. The left ventricle demonstrates global  hypokinesis. The left ventricular internal  cavity size was moderately dilated. There is moderate asymmetric left  ventricular hypertrophy of the basal-septal  segment. Left ventricular  diastolic function could not be evaluated.   2. Right ventricular systolic function is normal. The right ventricular  size is normal. Tricuspid regurgitation signal is inadequate for assessing  PA pressure.   3. Left atrial size was moderately dilated.   4. The mitral valve is abnormal. Mild to moderate mitral valve  regurgitation.   5. The aortic valve is tricuspid. Aortic valve regurgitation is not  visualized. Mild aortic valve stenosis. Aortic valve mean gradient  measures 16.5 mmHg. Aortic valve Vmax measures 2.76 m/s.   6. The inferior vena cava is normal in size with <50% respiratory  variability, suggesting right atrial pressure of 8 mmHg.   Cardiac monitor 04/14/2021 personally reviewed  Predominant rhythm was sinus rhythm 28% PVC burden Multiple runs of VT, longest 20 beats  ASSESSMENT AND PLAN:  PVCs: Elevated burden at 28%.  Has symptoms of weakness and fatigue.  Ejection fraction mildly reduced.  Currently on flecainide 100 mg twice daily.  High risk medication monitoring for flecainide.  ECG today shows normal QRS.  He is continuing to have weakness and fatigue.  I am concerned that he is having more frequent PVCs and that the flecainide is not suppressing them.  I Yer Castello have him wear a 7-day monitor.  If the monitor shows a high burden of PVCs, he may benefit from ablation versus adjusting medications.  2.  Chronic systolic heart failure: Mildly reduced ejection fraction.  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  Currently on Entresto.  Plan per primary cardiology.   Current medicines are reviewed at length with the patient today.   The patient does not have concerns regarding his medicines.  The following changes were made today: None  Labs/ tests ordered today include:  Orders Placed This Encounter  Procedures   LONG TERM MONITOR (3-14 DAYS)   EKG 12-Lead     Disposition:   FU with Landra Howze 6 months  Signed, Kerwin Augustus Meredith Leeds,  MD  12/02/2021 9:17 AM  [place] [address] [place] [address] [phone] (office) [phone] (fax)

## 2021-12-02 NOTE — Progress Notes (Unsigned)
Enrolled for [employer] to mail a ZIO XT long term holter monitor to the patients address on file.  

## 2021-12-05 ENCOUNTER — Ambulatory Visit: Payer: Medicare Other | Admitting: Podiatry

## 2021-12-05 ENCOUNTER — Encounter: Payer: Self-pay | Admitting: Podiatry

## 2021-12-05 DIAGNOSIS — L600 Ingrowing nail: Secondary | ICD-10-CM | POA: Diagnosis not present

## 2021-12-05 DIAGNOSIS — I493 Ventricular premature depolarization: Secondary | ICD-10-CM | POA: Diagnosis not present

## 2021-12-05 MED ORDER — DOXYCYCLINE HYCLATE 100 MG PO TABS: 100.0000 mg | ORAL_TABLET | Freq: Two times a day (BID) | ORAL | 0 refills | Status: AC

## 2021-12-05 NOTE — Patient Instructions (Signed)

## 2021-12-05 NOTE — Progress Notes (Signed)
Subjective:  Patient ID: Arthur Klein, male    DOB: [birth year],  MRN: [id number]  Chief Complaint  Patient presents with   Diabetes    76 y.o. male presents with the above complaint.  Patient presents with right medial second digit paronychia with underlying ingrown.  Patient states pain for touch is progressive gotten worse worse with ambulation he is a diabetic his A1c is well controlled to 5.7 he has not seen and was prior to seeing me denies any other acute complaints hurts with ambulation hurts with pressure.   Review of Systems: Negative except as noted in the HPI. Denies N/V/F/Ch.  Past Medical History:  Diagnosis Date   Arthritis    Complication of anesthesia    doesn't wake up well,combative until wife talks with him   Diabetes mellitus without complication ([place])    diet controlled    Esophageal reflux    Gout    hx of   H/O hiatal hernia    History of kidney stones    Hypertension    borderline HTNt   Plantar fasciitis    no problems now   PONV (postoperative nausea and vomiting)    Prostate cancer ([place]) 08/27/2012   gleason 6, volume 59.3 cc   PVC (premature ventricular contraction)    hx of    Skin cancer 2011   melanoma on back, resected    Undescended left testes     Current Outpatient Medications:    doxycycline (VIBRA-TABS) 100 MG tablet, Take 1 tablet (100 mg total) by mouth 2 (two) times daily for 10 days., Disp: 20 tablet, Rfl: 0   acetaminophen (TYLENOL) 500 MG tablet, Take 500-1,000 mg by mouth every 6 (six) hours as needed for fever (for pain.). , Disp: , Rfl:    amoxicillin (AMOXIL) 500 MG capsule, Take 2,000 mg by mouth as directed. Take 4 capsules (2000 mg) by prior to dental appointment., Disp: , Rfl: 4   B Complex Vitamins (VITAMIN B COMPLEX) TABS, Take 1 tablet by mouth daily as needed (low energy)., Disp: , Rfl:    B-D UF III MINI PEN NEEDLES 31G X 5 MM MISC, See admin instructions., Disp: , Rfl:    BAYER CONTOUR NEXT TEST test strip, 1 each  by Other route daily. , Disp: , Rfl: 2   BAYER MICROLET LANCETS lancets, 1 each by Other route daily. , Disp: , Rfl: 2   Blood Glucose Monitoring Suppl (CONTOUR NEXT EZ MONITOR) w/Device KIT, 1 Device by Other route daily. , Disp: , Rfl: 0   chlorhexidine (PERIDEX) 0.12 % solution, SMARTSIG:1 Capful(s) By Mouth Twice Daily, Disp: , Rfl:    Cholecalciferol (VITAMIN D-3) 1000 UNITS CAPS, Take 1,000 Units by mouth daily. , Disp: , Rfl:    colchicine 0.6 MG tablet, , Disp: , Rfl:    diphenhydrAMINE (BENADRYL) 12.5 MG/5ML elixir, Take 3.125 mg by mouth 4 (four) times daily as needed (for allergies.)., Disp: , Rfl:    flecainide (TAMBOCOR) 100 MG tablet, Take 1 tablet (100 mg total) by mouth 2 (two) times daily., Disp: 60 tablet, Rfl: 3   Insulin Pen Needle (PEN NEEDLES) 31G X 6 MM MISC, use for ozempic as directed. ICD10: E11.65, Disp: , Rfl:    Multiple Vitamins-Minerals (ICAPS AREDS 2 PO), Take 1 capsule by mouth 2 (two) times daily., Disp: , Rfl:    Nutritional Supplements (JUICE PLUS FIBRE PO), Take 2 capsules by mouth daily., Disp: , Rfl:    Probiotic Product (PROBIOTIC DAILY PO), Take  1 tablet by mouth daily., Disp: , Rfl:    sacubitril-valsartan (ENTRESTO) 49-51 MG, Take 1 tablet by mouth 2 (two) times daily., Disp: 90 tablet, Rfl: 3   Semaglutide,0.25 or 0.5MG/DOS, (OZEMPIC, 0.25 OR 0.5 MG/DOSE,) 2 MG/1.5ML SOPN, inject 0.3m once per week x 1 month, then advance to 0.510monce per week. ICD10 : E11.65, Disp: , Rfl:   Social History   Tobacco Use  Smoking Status Never  Smokeless Tobacco Current   Types: Chew  Tobacco Comments   Patient declines materials to quit chewing tobacco.    Allergies  Allergen Reactions   Erythrocin Diarrhea   Erythromycin Diarrhea   Lisinopril Cough   Nsaids Other (See Comments)    Gi bleeding Has tolerated limited, small amounts of Meloxicam   Oxycodone Other (See Comments)    Became a "wild person" and had total amnesia   Objective:  There were no  vitals filed for this visit. There is no height or weight on file to calculate BMI. Constitutional Well developed. Well nourished.  Vascular Dorsalis pedis pulses palpable bilaterally. Posterior tibial pulses palpable bilaterally. Capillary refill normal to all digits.  No cyanosis or clubbing noted. Pedal hair growth normal.  Neurologic Normal speech. Oriented to person, place, and time. Epicritic sensation to light touch grossly present bilaterally.  Dermatologic Painful ingrowing nail at medial nail borders of the second toe nail right. No other open wounds. No skin lesions.  Orthopedic: Normal joint ROM without pain or crepitus bilaterally. No visible deformities. No bony tenderness.   Radiographs: None Assessment:   1. Ingrown nail of second toe of right foot    Plan:  Patient was evaluated and treated and all questions answered.  Ingrown Nail, right -Patient elects to proceed with minor surgery to remove ingrown toenail removal today. Consent reviewed and signed by patient. -Ingrown nail excised. See procedure note. -Educated on post-procedure care including soaking. Written instructions provided and reviewed. -Patient to follow up in 2 weeks for nail check.  Procedure: Excision of Ingrown Toenail Location: Right 2nd toe medial nail borders. Anesthesia: Lidocaine 1% plain; 1.5 mL and Marcaine 0.5% plain; 1.5 mL, digital block. Skin Prep: Betadine. Dressing: Silvadene; telfa; dry, sterile, compression dressing. Technique: Following skin prep, the toe was exsanguinated and a tourniquet was secured at the base of the toe. The affected nail border was freed, split with a nail splitter, and excised. Chemical matrixectomy was then performed with phenol and irrigated out with alcohol. The tourniquet was then removed and sterile dressing applied. Disposition: Patient tolerated procedure well. Patient to return in 2 weeks for follow-up.   No follow-ups on file.

## 2022-01-12 ENCOUNTER — Telehealth: Payer: Self-pay

## 2022-01-12 NOTE — Telephone Encounter (Signed)
   Pre-operative Risk Assessment    Patient Name: Arthur Klein  DOB: 1945/12/26 MRN: [id number]      Request for Surgical Clearance    Procedure:   left reverse total shoulder replacement  Date of Surgery:  Clearance TBD                                 Surgeon:  Dr. Ophelia Charter Surgeon's Group or Practice Name:  [facility] Phone number:  [phone] ext: Solomon Fax number:  [phone]    Type of Clearance Requested:   - Medical    Type of Anesthesia:  General  with block   Additional requests/questions:    Erin Hearing   01/12/2022, 5:04 PM

## 2022-01-12 NOTE — Progress Notes (Unsigned)
Cardiology Office Note   Date:  01/13/2022   ID:  Arthur Klein, DOB [birth year], MRN [id number]  PCP:  Velna Hatchet, MD  Cardiologist:   Minus Breeding, MD Referring:  Velna Hatchet, MD   Chief Complaint  Patient presents with   Palpitations      History of Present Illness: Arthur Klein is a 76 y.o. male who presents for evaluation of bradycardia.  The patient recently was noted to have palpitations and an abnormal EKG.  He previously had PVCs noted in 2017.  At that time he had an abnormal stress test suggesting reduced ejection fraction.  There was no ischemia or infarction.  A month later follow-up echo demonstrated EF of 50 - 55%.      In Dec 2022 Echo suggested that the EF was slightly lower than previous at 40 - 45% so I changed him to War.  He has had some episodes of dizziness and had a monitor with runs of NSVT.  This was in Feb 2023.   He had a perfusion study to rule out obstructive CAD.  He had no ischemia.  He did have an appt with Dr. Curt Bears as he had a high burden of ventricular ectopy on a monitor and was started on flecainide.   Monitor demonstrated a significantly reduced PVC burden.  At the last visit I increased Entresto.    He continues to have multiple complaints.  He still gets occasional dizziness but somewhat better.  Does not correlate necessarily with his palpitations.  He is not having any new chest pressure, neck or arm discomfort.  He seems to have tolerated the increased dose of Entresto but it is hard to tell whether any of his dizziness is related to low heart rates.  That has not been recorded.  He complains of foot pain and cold feet.  He denies any new shortness of breath, PND or orthopnea.  He has had no new weight gain or edema.   Past Medical History:  Diagnosis Date   Arthritis    Complication of anesthesia    doesn't wake up well,combative until wife talks with him   Diabetes mellitus without complication ([place])    diet controlled     Esophageal reflux    Gout    hx of   H/O hiatal hernia    History of kidney stones    Hypertension    borderline HTNt   Plantar fasciitis    no problems now   PONV (postoperative nausea and vomiting)    Prostate cancer ([place]) 08/27/2012   gleason 6, volume 59.3 cc   PVC (premature ventricular contraction)    hx of    Skin cancer 2011   melanoma on back, resected    Undescended left testes     Past Surgical History:  Procedure Laterality Date   CYSTOSCOPY WITH RETROGRADE PYELOGRAM, URETEROSCOPY AND STENT PLACEMENT Left 02/18/2016   Procedure: CYSTOSCOPY WITH LEFT RETROGRADE PYELOGRAM, URETEROSCOPY  AND URETERAL  STENT PLACEMENT;  Surgeon: Raynelle Bring, MD;  Location: [facility];  Service: Urology;  Laterality: Left;   HERNIA REPAIR     INGUINAL HERNIA REPAIR Bilateral 03/16/2015   Procedure: LAPAROSCOPIC REPAIR OF BILATERAL FEMEROL AND  BILATERAL INGUINAL HERNIAS WITH MESH;  Surgeon: Michael Boston, MD;  [facility]: [facility];  Service: General;  Laterality: Bilateral;   INSERTION OF MESH Right 03/16/2015   Procedure: INSERTION OF MESH;  Surgeon: Michael Boston, MD;  [facility];  Service: General;  Laterality: Right;  KNEE ARTHROPLASTY  05/02/2011   Procedure: COMPUTER ASSISTED TOTAL KNEE ARTHROPLASTY;  Surgeon: Alta Corning, MD;  Location: [facility];  Service: Orthopedics;  Laterality: Right;   KNEE ARTHROSCOPY     right   LAPAROSCOPIC LYSIS OF ADHESIONS  03/16/2015   Procedure: LAPAROSCOPIC LYSIS OF ADHESIONS;  Surgeon: Michael Boston, MD;  [facility];  Service: General;;   MELANOMA EXCISION     stage 3+ lower left lumbar region with axillary node removal   MOHS SURGERY     mose surgery     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      amoxicillin (AMOXIL) 500 MG capsule Take 2,000 mg by mouth as directed. Take 4 capsules (2000 mg) by prior to dental appointment.  4   B Complex Vitamins (VITAMIN B COMPLEX) TABS Take 1 tablet by mouth daily as needed (low energy).     B-D UF III MINI PEN NEEDLES 31G X 5 MM MISC See admin instructions.     BAYER CONTOUR NEXT TEST test strip 1 each by Other route daily.   2   BAYER MICROLET LANCETS lancets 1 each by Other route daily.   2   Blood Glucose Monitoring Suppl (CONTOUR NEXT EZ MONITOR) w/Device KIT 1 Device by Other route daily.   0   chlorhexidine (PERIDEX) 0.12 % solution SMARTSIG:1 Capful(s) By Mouth Twice Daily     Cholecalciferol (VITAMIN D-3) 1000 UNITS CAPS Take 1,000 Units by mouth daily.      colchicine 0.6 MG tablet      diphenhydrAMINE (BENADRYL) 12.5 MG/5ML elixir Take 3.125 mg by mouth 4 (four) times daily as needed (for allergies.).     flecainide (TAMBOCOR) 100 MG tablet Take 1 tablet (100 mg total) by mouth 2 (two) times daily. 60 tablet 3   Insulin Pen Needle (PEN NEEDLES) 31G X 6 MM MISC use for ozempic as directed. ICD10: E11.65     Multiple Vitamins-Minerals (ICAPS AREDS 2 PO) Take 1 capsule by mouth 2 (two) times daily.     Nutritional Supplements (JUICE PLUS FIBRE PO) Take 2 capsules by mouth daily.     Probiotic Product (PROBIOTIC DAILY PO) Take 1 tablet by mouth daily.     sacubitril-valsartan (ENTRESTO) 49-51 MG Take 1 tablet by mouth 2 (two) times daily. 90 tablet 3   Semaglutide,0.25 or 0.5MG/DOS, (OZEMPIC, 0.25 OR 0.5 MG/DOSE,) 2 MG/1.5ML SOPN inject 0.32m once per week x 1 month, then advance to 0.529monce per week. ICD10 : E11.65     No current facility-administered  medications for this visit.    Allergies:   Erythrocin, Erythromycin, Lisinopril, Nsaids, and Oxycodone    ROS:  Please see the history of present illness.   Otherwise, review of systems are positive for left shoulder pain with degenerative joint.   All other systems are reviewed and negative.    PHYSICAL EXAM: VS:  BP (!) 142/78   Pulse 87   Ht _0  (1.727 m)   Wt 206 lb 9.6 oz (93.7 kg)   SpO2 98%   BMI 31.41 kg/m  , BMI Body mass index is 31.41 kg/m. GENERAL:  Well appearing NECK:  No jugular venous distention, waveform within normal limits, carotid upstroke brisk and symmetric, left carotid bruits, no thyromegaly LUNGS:  Clear to auscultation bilaterally CHEST:  Unremarkable HEART:  PMI not displaced or sustained,S1 and S2 within normal limits, no S3, no S4, no clicks, no rubs, 2 out of 6 apical systolic murmur radiating slightly at the aortic outflow tract, no diastolic murmurs ABD:  Flat, positive bowel sounds normal in frequency in pitch, no bruits, no rebound, no guarding, no midline pulsatile mass, no hepatomegaly, no splenomegaly EXT:  2 plus pulses throughout, no edema, no cyanosis no clubbing   EKG:  EKG is  ordered today. Sinus rhythm, rate 86, axis within normal limits, intervals within normal limits, premature ventricular contractions in a bigeminal pattern.   Recent Labs: 02/14/2021: TSH 2.340 06/18/2021: BUN 18; Creatinine, Ser 1.20; Potassium 4.5; Sodium 139    Lipid Panel No results found for: "CHOL", "TRIG", "HDL", "CHOLHDL", "VLDL", "LDLCALC", "LDLDIRECT"    Wt Readings from Last 3 Encounters:  01/13/22 206 lb 9.6 oz (93.7 kg)  12/02/21 203 lb (92.1 kg)  10/11/21 206 lb (93.4 kg)      Other studies Reviewed: Additional studies/ records that were reviewed today include:  EP notes Review of the above records demonstrates:  Please see elsewhere in the note.     ASSESSMENT AND PLAN:  PVCs:   He is doing better on flecainide.  Continue current  management.  REDUCED EF:   He has lots of dizziness and is hard to sort this out as to whether this might be slight intolerance of his medications.  I am not going to titrate further for now.  He has had bradycardia so I do not think adding a beta-blocker would be tolerated.  Of note he also has some moderate left asymmetric septal hypertrophy.  I will follow-up with future echocardiograms.  DM: His A1c was 6.3.  No change in therapy.  BRUIT: I will check carotid Dopplers.  FOOT PAIN: We will check ABIs.  Current medicines are reviewed at length with the patient today.  The patient does not have concerns regarding medicines.  The following changes have been made: None  Labs/ tests ordered today include: None   Orders Placed This Encounter  Procedures   EKG 12-Lead   VAS US CAROTID   VAS [place] ABI WITH/WO TBI     Disposition:   FU with me in six months   Signed, Minus Breeding, MD  01/13/2022 1:22 PM    [facility]

## 2022-01-13 ENCOUNTER — Encounter: Payer: Self-pay | Admitting: Cardiology

## 2022-01-13 ENCOUNTER — Ambulatory Visit: Payer: Medicare Other | Attending: Cardiology | Admitting: Cardiology

## 2022-01-13 VITALS — BP 142/78 | HR 87 | Ht 68.0 in | Wt 206.6 lb

## 2022-01-13 DIAGNOSIS — I429 Cardiomyopathy, unspecified: Secondary | ICD-10-CM

## 2022-01-13 DIAGNOSIS — I493 Ventricular premature depolarization: Secondary | ICD-10-CM | POA: Diagnosis not present

## 2022-01-13 DIAGNOSIS — M79606 Pain in leg, unspecified: Secondary | ICD-10-CM

## 2022-01-13 DIAGNOSIS — R0989 Other specified symptoms and signs involving the circulatory and respiratory systems: Secondary | ICD-10-CM

## 2022-01-13 DIAGNOSIS — E118 Type 2 diabetes mellitus with unspecified complications: Secondary | ICD-10-CM | POA: Diagnosis not present

## 2022-01-13 NOTE — Telephone Encounter (Signed)
Pt just saw Dr. Percival Spanish today.

## 2022-01-13 NOTE — Telephone Encounter (Signed)
   Name: Arthur Klein  DOB: [birth year]  MRN: [id number]  Primary Cardiologist: Minus Breeding, MD  Chart reviewed as part of pre-operative protocol coverage. Because of [name]'s past medical history and time since last visit, he will require a follow-up telephone visit in order to better assess preoperative cardiovascular risk.  Pre-op covering staff: - Please schedule appointment and call patient to inform them. If patient already had an upcoming appointment within acceptable timeframe, please add "pre-op clearance" to the appointment notes so provider is aware. - Please contact requesting surgeon's office via preferred method (i.e, phone, fax) to inform them of need for appointment prior to surgery.  No medications indicated as needing held   Elgie Collard, [place]  01/13/2022, 8:17 AM

## 2022-01-13 NOTE — Patient Instructions (Signed)
Medication Instructions:  Your physician recommends that you continue on your current medications as directed. Please refer to the Current Medication list given to you today.  *If you need a refill on your cardiac medications before your next appointment, please call your pharmacy*  Lab Work: NONE ordered at this time of appointment   If you have labs (blood work) drawn today and your tests are completely normal, you will receive your results only by: [place] (if you have MyChart) OR A paper copy in the mail If you have any lab test that is abnormal or we need to change your treatment, we will call you to review the results.  Testing/Procedures: Your physician has requested that you have an ankle brachial index (ABI). During this test an ultrasound and blood pressure cuff are used to evaluate the arteries that supply the arms and legs with blood. Allow thirty minutes for this exam. There are no restrictions or special instructions.  Your physician has requested that you have a carotid duplex. This test is an ultrasound of the carotid arteries in your neck. It looks at blood flow through these arteries that supply the brain with blood. Allow one hour for this exam. There are no restrictions or special instructions.  Follow-Up: At [facility], you and your health needs are our priority.  As part of our continuing mission to provide you with exceptional heart care, we have created designated Provider Care Teams.  These Care Teams include your primary Cardiologist (physician) and Advanced Practice Providers (APPs -  Physician Assistants and Nurse Practitioners) who all work together to provide you with the care you need, when you need it.  We recommend signing up for the patient portal called "MyChart".  Sign up information is provided on this After Visit Summary.  MyChart is used to connect with patients for Virtual Visits (Telemedicine).  Patients are able to view lab/test  results, encounter notes, upcoming appointments, etc.  Non-urgent messages can be sent to your provider as well.   To learn more about what you can do with MyChart, go to [email or web address].    Your next appointment:   6 month(s)  The format for your next appointment:   In Person  Provider:   Minus Breeding, MD     Other Instructions   Important Information About Sugar

## 2022-01-14 NOTE — Telephone Encounter (Signed)
   Patient Name: Arthur Klein  DOB: 09-27-1945 MRN: [id number]  Primary Cardiologist: Minus Breeding, MD  Chart reviewed as part of pre-operative protocol coverage. Pre-op clearance already addressed by colleagues in earlier phone notes. To summarize recommendations:  - I have not approved him for an elective procedure while he is undergoing cardiac work up.  I talked to him and his wife about not scheduling the surgery  -Dr. Percival Spanish  Will route this bundled recommendation to requesting provider via Epic fax function and remove from pre-op pool. Please call with questions.  Elgie Collard, PA-C 01/14/2022, 8:53 AM

## 2022-01-29 ENCOUNTER — Ambulatory Visit: Payer: Medicare Other

## 2022-02-18 ENCOUNTER — Ambulatory Visit (INDEPENDENT_AMBULATORY_CARE_PROVIDER_SITE_OTHER): Payer: Medicare Other

## 2022-02-18 ENCOUNTER — Ambulatory Visit: Payer: Medicare Other | Attending: Cardiology

## 2022-02-18 DIAGNOSIS — M79605 Pain in left leg: Secondary | ICD-10-CM | POA: Diagnosis not present

## 2022-02-18 DIAGNOSIS — M79606 Pain in leg, unspecified: Secondary | ICD-10-CM

## 2022-02-18 DIAGNOSIS — M79604 Pain in right leg: Secondary | ICD-10-CM | POA: Diagnosis not present

## 2022-02-18 DIAGNOSIS — R0989 Other specified symptoms and signs involving the circulatory and respiratory systems: Secondary | ICD-10-CM | POA: Diagnosis not present

## 2022-03-04 ENCOUNTER — Encounter: Payer: Self-pay | Admitting: *Deleted

## 2022-04-19 ENCOUNTER — Other Ambulatory Visit: Payer: Self-pay | Admitting: Cardiology

## 2022-04-21 ENCOUNTER — Telehealth: Payer: Self-pay | Admitting: *Deleted

## 2022-04-21 NOTE — Telephone Encounter (Signed)
S/w pt does not want to pursue surgery at this time. Pt will hold off till upcoming appointments in April and May with cardiologists. Will send to Surgeons office.

## 2022-04-21 NOTE — Telephone Encounter (Signed)
Primary Cardiologist:James Hochrein, MD   Preoperative team, please contact this patient and set up a phone call appointment for further preoperative risk assessment. Please obtain consent and complete medication review. Thank you for your help.   I confirm that guidance regarding antiplatelet and oral anticoagulation therapy has been completed and, if necessary, noted below (none requested).   Emmaline Life, NP-C  04/21/2022, 12:51 PM [address] Office [phone] Fax [phone]

## 2022-04-21 NOTE — Telephone Encounter (Signed)
   Pre-operative Risk Assessment    Patient Name: Arthur Klein  DOB: 02-Sep-1945 MRN: [id number]      Request for Surgical Clearance    Procedure:   LEFT REVERSE TOTAL SHOULDER REPLACEMENT  Date of Surgery:  Clearance TBD                                 Surgeon:  DR. Ophelia Charter Surgeon's Group or Practice Name:  [facility] Phone number:  [phone] ATTN: Arthur Klein EXT [id number] Fax number:  [phone]   Type of Clearance Requested:   - Medical ; NO MEDICATIONS ARE LISTED AS NEEDING TO BE HELD   Type of Anesthesia:  General WITH INTERSCALENE BLOCK   Additional requests/questions:    Arthur Klein   04/21/2022, 9:17 AM

## 2022-06-16 ENCOUNTER — Ambulatory Visit: Payer: Medicare Other | Admitting: Cardiology

## 2022-06-20 ENCOUNTER — Ambulatory Visit: Payer: Medicare Other | Attending: Cardiology | Admitting: Cardiology

## 2022-06-20 ENCOUNTER — Encounter: Payer: Self-pay | Admitting: Cardiology

## 2022-06-20 VITALS — BP 102/70 | HR 96 | Ht 68.0 in | Wt 203.0 lb

## 2022-06-20 DIAGNOSIS — I5022 Chronic systolic (congestive) heart failure: Secondary | ICD-10-CM | POA: Diagnosis not present

## 2022-06-20 DIAGNOSIS — I493 Ventricular premature depolarization: Secondary | ICD-10-CM

## 2022-06-20 DIAGNOSIS — Z79899 Other long term (current) drug therapy: Secondary | ICD-10-CM | POA: Diagnosis not present

## 2022-06-20 DIAGNOSIS — R0602 Shortness of breath: Secondary | ICD-10-CM | POA: Diagnosis not present

## 2022-06-20 NOTE — Progress Notes (Signed)
Electrophysiology Office Note   Date:  06/20/2022   ID:  Arthur Klein, DOB 09-01-1945, MRN [id number]  PCP:  Alysia Penna, MD  Cardiologist:  Hochrein Primary Electrophysiologist:  Aala Ransom Jorja Loa, MD    Chief Complaint: PVC   History of Present Illness: Arthur Klein is a 77 y.o. male who is being seen today for the evaluation of PVC at the request of Alysia Penna, MD. Presenting today for electrophysiology evaluation.  He has a history significant for diabetes and PVCs.  He went to his primary physician's office and was noted to be bradycardic.  EKG showed PVCs.  He wore a cardiac monitor that showed a 28% burden.  He had an echo that showed an ejection fraction of 40 to 45%.  He was started on flecainide with improvement in his PVC burden to 4.9%.  Today, denies symptoms of palpitations, chest pain, orthopnea, PND, lower extremity edema, claudication, dizziness, presyncope, syncope, bleeding, or neurologic sequela. The patient is tolerating medications without difficulties.  Since being seen, he has a myriad of complaints.  His main issue is shortness of breath and fatigue.  He finds it difficult to do his daily activities due to his level of shortness of breath.  He feels at times okay when he is at rest, but does get short of breath at rest as well.  He has no chest pain associated.  Per his wife, she hears him breathing rapidly when he is sleeping as well.   Past Medical History:  Diagnosis Date   Arthritis    Complication of anesthesia    doesn't wake up well,combative until wife talks with him   Diabetes mellitus without complication    diet controlled    Esophageal reflux    Gout    hx of   H/O hiatal hernia    History of kidney stones    Hypertension    borderline HTNt   Plantar fasciitis    no problems now   PONV (postoperative nausea and vomiting)    Prostate cancer 08/27/2012   gleason 6, volume 59.3 cc   PVC (premature ventricular contraction)    hx  of    Skin cancer 2011   melanoma on back, resected    Undescended left testes    Past Surgical History:  Procedure Laterality Date   CYSTOSCOPY WITH RETROGRADE PYELOGRAM, URETEROSCOPY AND STENT PLACEMENT Left 02/18/2016   Procedure: CYSTOSCOPY WITH LEFT RETROGRADE PYELOGRAM, URETEROSCOPY  AND URETERAL  STENT PLACEMENT;  Surgeon: Heloise Purpura, MD;  [facility];  Service: Urology;  Laterality: Left;   HERNIA REPAIR     INGUINAL HERNIA REPAIR Bilateral 03/16/2015   Procedure: LAPAROSCOPIC REPAIR OF BILATERAL FEMEROL AND  BILATERAL INGUINAL HERNIAS WITH MESH;  Surgeon: Karie Soda, MD;  [facility];  Service: General;  Laterality: Bilateral;   INSERTION OF MESH Right 03/16/2015   Procedure: INSERTION OF MESH;  Surgeon: Karie Soda, MD;  [facility];  Service: General;  Laterality: Right;   KNEE ARTHROPLASTY  05/02/2011   Procedure: COMPUTER ASSISTED TOTAL KNEE ARTHROPLASTY;  Surgeon: Harvie Junior, MD;  [facility];  Service: Orthopedics;  Laterality: Right;   KNEE ARTHROSCOPY     right   LAPAROSCOPIC LYSIS OF ADHESIONS  03/16/2015   Procedure: LAPAROSCOPIC LYSIS OF ADHESIONS;  Surgeon: Karie Soda, MD;  [facility];  Service: General;;   MELANOMA EXCISION     stage 3+ lower left lumbar region with axillary node removal   MOHS SURGERY  mose surgery     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      B Complex Vitamins (VITAMIN B  COMPLEX) TABS Take 1 tablet by mouth daily as needed (low energy).     chlorhexidine (PERIDEX) 0.12 % solution SMARTSIG:1 Capful(s) By Mouth Twice Daily     Cholecalciferol (VITAMIN D-3) 1000 UNITS CAPS Take 1,000 Units by mouth daily.      colchicine 0.6 MG tablet Take 0.6 mg by mouth 2 (two) times daily as needed.     diphenhydrAMINE (BENADRYL) 12.5 MG/5ML elixir Take 3.125 mg by mouth 4 (four) times daily as needed (for allergies.).     flecainide (TAMBOCOR) 100 MG tablet TAKE 1 TABLET(100 MG) BY MOUTH TWICE DAILY 60 tablet 9   Multiple Vitamins-Minerals (ICAPS AREDS 2 PO) Take 1 capsule by mouth 2 (two) times daily.     Nutritional Supplements (JUICE PLUS FIBRE PO) Take 2 capsules by mouth daily.     Probiotic Product (PROBIOTIC DAILY PO) Take 1 tablet by mouth daily.     sacubitril-valsartan (ENTRESTO) 49-51 MG TAKE 1 TABLET BY MOUTH TWICE DAILY 90 tablet 1   Semaglutide,0.25 or 0.5MG /DOS, (OZEMPIC, 0.25 OR 0.5 MG/DOSE,) 2 MG/1.5ML SOPN inject 0.5mg  once per week x 1 month, then advance to 0.5mg  once per week. ICD10 : E11.65     amoxicillin (AMOXIL) 500 MG capsule Take 2,000 mg by mouth as directed. Take 4 capsules (2000 mg) by prior to dental appointment.  4   B-D UF III MINI PEN NEEDLES 31G X 5 MM MISC See admin instructions.     BAYER CONTOUR NEXT TEST test strip 1 each by Other route daily.   2   BAYER MICROLET LANCETS lancets 1 each by Other route daily.   2   Blood Glucose Monitoring Suppl (CONTOUR NEXT EZ MONITOR) w/Device KIT 1 Device by Other route daily.   0   Insulin Pen Needle (PEN NEEDLES) 31G X 6 MM MISC use for ozempic as directed. ICD10: E11.65     No current facility-administered medications for this visit.    Allergies:   Erythrocin, Erythromycin, Lisinopril, Nsaids, and Oxycodone   Social History:  The patient  reports that he has never smoked. His smokeless tobacco use includes chew. He reports that he does not drink alcohol and does not use drugs.   Family History:   The patient's family history includes Cancer in his father; Cancer (age of onset: 14) in his brother; Congestive Heart Failure in his mother; Diabetes in his father and mother; Hypertension in his father and mother; Stroke in his father.    ROS:  Please see the history of present illness.   Otherwise, review of systems is positive for none.   All other systems are reviewed and negative.   PHYSICAL EXAM: VS:  BP 102/70   Pulse 96   Ht 5\' 8"  (1.727 m)  Wt 203 lb (92.1 kg)   BMI 30.87 kg/m  , BMI Body mass index is 30.87 kg/m. GEN: Well nourished, well developed, in no acute distress  HEENT: normal  Neck: no JVD, carotid bruits, or masses Cardiac: RRR; no murmurs, rubs, or gallops,no edema  Respiratory:  clear to auscultation bilaterally, normal work of breathing GI: soft, nontender, nondistended, + BS MS: no deformity or atrophy  Skin: warm and dry Neuro:  Strength and sensation are intact Psych: euthymic mood, full affect  EKG:  EKG is ordered today. Personal review of the ekg ordered shows sinus rhythm, incomplete left bundle branch block  Recent Labs: No results found for requested labs within last 365 days.    Lipid Panel  No results found for: "CHOL", "TRIG", "HDL", "CHOLHDL", "VLDL", "LDLCALC", "LDLDIRECT"   Wt Readings from Last 3 Encounters:  06/20/22 203 lb (92.1 kg)  01/13/22 206 lb 9.6 oz (93.7 kg)  12/02/21 203 lb (92.1 kg)      Other studies Reviewed: Additional studies/ records that were reviewed today include: TTE 07/16/21  Review of the above records today demonstrates:   1. Left ventricular ejection fraction, by estimation, is 40 to 45%. Left  ventricular ejection fraction by PLAX is 41 %. The left ventricle has  mildly decreased function. The left ventricle demonstrates global  hypokinesis. The left ventricular internal  cavity size was moderately dilated. There is moderate asymmetric left  ventricular hypertrophy of the basal-septal segment. Left  ventricular  diastolic function could not be evaluated.   2. Right ventricular systolic function is normal. The right ventricular  size is normal. Tricuspid regurgitation signal is inadequate for assessing  PA pressure.   3. Left atrial size was moderately dilated.   4. The mitral valve is abnormal. Mild to moderate mitral valve  regurgitation.   5. The aortic valve is tricuspid. Aortic valve regurgitation is not  visualized. Mild aortic valve stenosis. Aortic valve mean gradient  measures 16.5 mmHg. Aortic valve Vmax measures 2.76 m/s.   6. The inferior vena cava is normal in size with <50% respiratory  variability, suggesting right atrial pressure of 8 mmHg.   Cardiac monitor 04/14/2021 personally reviewed  Predominant rhythm was sinus rhythm 28% PVC burden Multiple runs of VT, longest 20 beats  ASSESSMENT AND PLAN:  1.  PVCs: Burden at 28%.  Ejection fraction mildly reduced.  Currently on flecainide.  Repeat monitor shows a 4.9% PVC burden.  He has having significant shortness of breath and fatigue.  I am concerned about his flecainide as this may be contributing.  Arthur Klein have him hold flecainide for 1 week to see if this improves his symptoms.  2.  Chronic systolic heart failure: Ejection fraction mildly reduced.  Currently on optimal medical therapy per primary cardiology.  He is having fatigue and shortness of breath.  Glenford Garis check a BNP.  May need to update his echo.  3.  High risk medication monitoring: Currently on flecainide for PVCs.  QRS remains narrow.   Current medicines are reviewed at length with the patient today.   The patient does not have concerns regarding his medicines.  The following changes were made today: none  Labs/ tests ordered today include:  Orders Placed This Encounter  Procedures   Pro b natriuretic peptide (BNP)   EKG 12-Lead     Disposition:   FU 6 months  Signed, Cartina Brousseau Jorja Loa, MD  06/20/2022 11:26 AM     [facility] [address] [employer]  [address] [place] [address] [phone] (office) [phone] (fax)

## 2022-06-20 NOTE — Patient Instructions (Signed)
Medication Instructions:  Your physician recommends that you continue on your current medications as directed. Please refer to the Current Medication list given to you today.  Hold your Flecainide for a week.  Please call the office and let us know how you are doing after holding  *If you need a refill on your cardiac medications before your next appointment, please call your pharmacy*   Lab Work: Today:  BNP If you have labs (blood work) drawn today and your tests are completely normal, you will receive your results only by: MyChart Message (if you have MyChart) OR A paper copy in the mail If you have any lab test that is abnormal or we need to change your treatment, we will call you to review the results.   Testing/Procedures: None ordered   Follow-Up: At [facility], you and your health needs are our priority.  As part of our continuing mission to provide you with exceptional heart care, we have created designated Provider Care Teams.  These Care Teams include your primary Cardiologist (physician) and Advanced Practice Providers (APPs -  Physician Assistants and Nurse Practitioners) who all work together to provide you with the care you need, when you need it.   Your next appointment:   To be  determined  The format for your next appointment:   In Person  Provider:   Loman Brooklyn, MD    Thank you for choosing [facility]!!   Dory Horn, RN [phone]

## 2022-06-21 LAB — PRO B NATRIURETIC PEPTIDE: NT-Pro BNP: 4042 pg/mL — ABNORMAL HIGH (ref 0–376)

## 2022-06-23 ENCOUNTER — Encounter (HOSPITAL_COMMUNITY): Payer: Self-pay

## 2022-06-23 ENCOUNTER — Inpatient Hospital Stay (HOSPITAL_COMMUNITY)
Admission: EM | Admit: 2022-06-23 | Discharge: 2022-06-26 | DRG: 286 | Disposition: A | Discharge status: Home / Self Care | Payer: Medicare Other | Attending: Internal Medicine | Admitting: Internal Medicine

## 2022-06-23 ENCOUNTER — Telehealth: Payer: Self-pay | Admitting: Cardiology

## 2022-06-23 ENCOUNTER — Emergency Department (HOSPITAL_COMMUNITY): Payer: Medicare Other

## 2022-06-23 ENCOUNTER — Other Ambulatory Visit: Payer: Self-pay

## 2022-06-23 DIAGNOSIS — I420 Dilated cardiomyopathy: Secondary | ICD-10-CM | POA: Diagnosis present

## 2022-06-23 DIAGNOSIS — I110 Hypertensive heart disease with heart failure: Secondary | ICD-10-CM | POA: Diagnosis not present

## 2022-06-23 DIAGNOSIS — Z883 Allergy status to other anti-infective agents status: Secondary | ICD-10-CM

## 2022-06-23 DIAGNOSIS — Z8249 Family history of ischemic heart disease and other diseases of the circulatory system: Secondary | ICD-10-CM

## 2022-06-23 DIAGNOSIS — Z87891 Personal history of nicotine dependence: Secondary | ICD-10-CM

## 2022-06-23 DIAGNOSIS — Z7985 Long-term (current) use of injectable non-insulin antidiabetic drugs: Secondary | ICD-10-CM

## 2022-06-23 DIAGNOSIS — Z96651 Presence of right artificial knee joint: Secondary | ICD-10-CM | POA: Diagnosis present

## 2022-06-23 DIAGNOSIS — Z886 Allergy status to analgesic agent status: Secondary | ICD-10-CM

## 2022-06-23 DIAGNOSIS — I509 Heart failure, unspecified: Secondary | ICD-10-CM | POA: Diagnosis not present

## 2022-06-23 DIAGNOSIS — M109 Gout, unspecified: Secondary | ICD-10-CM | POA: Insufficient documentation

## 2022-06-23 DIAGNOSIS — Z85820 Personal history of malignant melanoma of skin: Secondary | ICD-10-CM

## 2022-06-23 DIAGNOSIS — I493 Ventricular premature depolarization: Secondary | ICD-10-CM | POA: Diagnosis not present

## 2022-06-23 DIAGNOSIS — I5023 Acute on chronic systolic (congestive) heart failure: Secondary | ICD-10-CM

## 2022-06-23 DIAGNOSIS — Z823 Family history of stroke: Secondary | ICD-10-CM

## 2022-06-23 DIAGNOSIS — Z79899 Other long term (current) drug therapy: Secondary | ICD-10-CM

## 2022-06-23 DIAGNOSIS — Z8546 Personal history of malignant neoplasm of prostate: Secondary | ICD-10-CM

## 2022-06-23 DIAGNOSIS — I2510 Atherosclerotic heart disease of native coronary artery without angina pectoris: Secondary | ICD-10-CM | POA: Diagnosis present

## 2022-06-23 DIAGNOSIS — I5043 Acute on chronic combined systolic (congestive) and diastolic (congestive) heart failure: Secondary | ICD-10-CM | POA: Diagnosis present

## 2022-06-23 DIAGNOSIS — Z833 Family history of diabetes mellitus: Secondary | ICD-10-CM

## 2022-06-23 DIAGNOSIS — K219 Gastro-esophageal reflux disease without esophagitis: Secondary | ICD-10-CM | POA: Diagnosis present

## 2022-06-23 DIAGNOSIS — Z888 Allergy status to other drugs, medicaments and biological substances status: Secondary | ICD-10-CM

## 2022-06-23 DIAGNOSIS — I5033 Acute on chronic diastolic (congestive) heart failure: Secondary | ICD-10-CM | POA: Insufficient documentation

## 2022-06-23 DIAGNOSIS — Z1152 Encounter for screening for COVID-19: Secondary | ICD-10-CM

## 2022-06-23 DIAGNOSIS — E785 Hyperlipidemia, unspecified: Secondary | ICD-10-CM | POA: Diagnosis present

## 2022-06-23 DIAGNOSIS — E1165 Type 2 diabetes mellitus with hyperglycemia: Secondary | ICD-10-CM | POA: Diagnosis present

## 2022-06-23 LAB — BASIC METABOLIC PANEL
Anion gap: 10 (ref 5–15)
BUN: 12 mg/dL (ref 8–23)
CO2: 24 mmol/L (ref 22–32)
Calcium: 8.9 mg/dL (ref 8.9–10.3)
Chloride: 103 mmol/L (ref 98–111)
Creatinine, Ser: 1.03 mg/dL (ref 0.61–1.24)
GFR, Estimated: >60 mL/min (ref >60)
Glucose, Bld: 129 mg/dL — ABNORMAL HIGH (ref 70–99)
Potassium: 4.1 mmol/L (ref 3.5–5.1)
Sodium: 137 mmol/L (ref 135–145)

## 2022-06-23 LAB — CBC
HCT: 40 % (ref 39.0–52.0)
Hemoglobin: 13.2 g/dL (ref 13.0–17.0)
MCH: 29.2 pg (ref 26.0–34.0)
MCHC: 33 g/dL (ref 30.0–36.0)
MCV: 88.5 fL (ref 80.0–100.0)
Platelets: 265 10*3/uL (ref 150–400)
RBC: 4.52 MIL/uL (ref 4.22–5.81)
RDW: 13.3 % (ref 11.5–15.5)
WBC: 7.9 10*3/uL (ref 4.0–10.5)
nRBC: 0 % (ref 0.0–0.2)

## 2022-06-23 LAB — SARS CORONAVIRUS 2 BY RT PCR: SARS Coronavirus 2 by RT PCR: NEGATIVE

## 2022-06-23 LAB — TROPONIN I (HIGH SENSITIVITY)
Troponin I (High Sensitivity): 17 ng/L (ref <18)
Troponin I (High Sensitivity): 16 ng/L (ref <18)

## 2022-06-23 LAB — BRAIN NATRIURETIC PEPTIDE: B Natriuretic Peptide: 1408.5 pg/mL — ABNORMAL HIGH (ref 0.0–100.0)

## 2022-06-23 MED ORDER — RISAQUAD PO CAPS
1.0000 | ORAL_CAPSULE | Freq: Every day | ORAL | Status: DC
  Administered 2022-06-24 – 2022-06-26 (×3): 1 via ORAL
  Filled 2022-06-23 (×4): qty 1

## 2022-06-23 MED ORDER — SACUBITRIL-VALSARTAN 49-51 MG PO TABS
1.0000 | ORAL_TABLET | Freq: Two times a day (BID) | ORAL | Status: DC
  Administered 2022-06-24 – 2022-06-26 (×5): 1 via ORAL
  Filled 2022-06-23 (×6): qty 1

## 2022-06-23 MED ORDER — ENOXAPARIN SODIUM 40 MG/0.4ML IJ SOSY
40.0000 mg | PREFILLED_SYRINGE | INTRAMUSCULAR | Status: DC
  Administered 2022-06-23 – 2022-06-25 (×3): 40 mg via SUBCUTANEOUS
  Filled 2022-06-23 (×3): qty 0.4

## 2022-06-23 MED ORDER — ACETAMINOPHEN 325 MG PO TABS: 650.0000 mg | ORAL_TABLET | ORAL | Status: DC | PRN

## 2022-06-23 MED ORDER — SODIUM CHLORIDE 0.9% FLUSH: 3.0000 mL | INTRAVENOUS | Status: DC | PRN

## 2022-06-23 MED ORDER — ONDANSETRON HCL 4 MG/2ML IJ SOLN
4.0000 mg | Freq: Four times a day (QID) | INTRAMUSCULAR | Status: DC | PRN
  Administered 2022-06-25: 4 mg via INTRAVENOUS
  Filled 2022-06-23: qty 2

## 2022-06-23 MED ORDER — SODIUM CHLORIDE 0.9 % IV SOLN: 250.0000 mL | INTRAVENOUS | Status: DC | PRN

## 2022-06-23 MED ORDER — ACETAMINOPHEN 500 MG PO TABS: 500.0000 mg | ORAL_TABLET | Freq: Four times a day (QID) | ORAL | Status: DC | PRN

## 2022-06-23 MED ORDER — SODIUM CHLORIDE 0.9% FLUSH
3.0000 mL | Freq: Two times a day (BID) | INTRAVENOUS | Status: DC
  Administered 2022-06-23 – 2022-06-25 (×5): 3 mL via INTRAVENOUS

## 2022-06-23 MED ORDER — COLCHICINE 0.6 MG PO TABS: 0.6000 mg | ORAL_TABLET | Freq: Two times a day (BID) | ORAL | Status: DC | PRN

## 2022-06-23 MED ORDER — DIPHENHYDRAMINE HCL 12.5 MG/5ML PO ELIX: 3.1250 mg | ORAL_SOLUTION | Freq: Four times a day (QID) | ORAL | Status: DC | PRN

## 2022-06-23 MED ORDER — GUAIFENESIN ER 600 MG PO TB12
1200.0000 mg | ORAL_TABLET | Freq: Two times a day (BID) | ORAL | Status: DC
  Administered 2022-06-23 – 2022-06-26 (×6): 1200 mg via ORAL
  Filled 2022-06-23 (×6): qty 2

## 2022-06-23 MED ORDER — FUROSEMIDE 10 MG/ML IJ SOLN
40.0000 mg | Freq: Once | INTRAMUSCULAR | Status: AC
  Administered 2022-06-23: 40 mg via INTRAVENOUS
  Filled 2022-06-23: qty 4

## 2022-06-23 NOTE — ED Triage Notes (Signed)
Pt was seen at [facility] office and a blood test was elevated and was told to come here. Pt's wife not sure what the blood work was. Pt was seen due to tachycardia and having several PVCs and tachypeic. Pt is eupneic at present. Pt c/o chest heaviness at 0730 today. Pt's wife states pt was restless throughout the night.

## 2022-06-23 NOTE — H&P (Signed)
History and Physical    Arthur Klein ZOX:[id number] DOB: 02/09/1946 DOA: 06/23/2022  PCP: Alysia Penna, MD (Confirm with patient/family/NH records and if not entered, this has to be entered at [facility] point of entry) Patient coming from: Home  I have personally briefly reviewed patient's old medical records in [facility]  Chief Complaint: SOB  HPI: Arthur Klein is a 77 y.o. 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  Patient has established history of frequent PVCs has been on flecainide about 7-8 months ago.  According to cardiologist outpatient monitoring data, his PVC burden has been reduced from 28% to 4.9%.  Last 2-3 weeks patient has been having increasing exertional dyspnea and cough, dry, denies any fever chills.  Patient went to see cardiology 3 days ago, at that point, cardiologist concern is his symptoms likely related to chronic flecainide use and he was instructed to stop flecainide which he did last Friday.  However, his breathing symptoms not much improved over the weekend.  And last 2 nights patient also developed orthopnea unable to sleep flat and has to increase pillow to 2-3.  This morning he also woke up with some pressure-like chest pain centrally located 2-3/10 associated with worsening shortness of breath and some sensation palpitations.  Family called cardiology's office and was told to come to the hospital because Friday blood work showed BMP elevated of 4500.  ED Course: Borderline tachycardia with frequent PVCs in the telemonitoring blood pressure 118/89 O2 saturation 95% on room air.  Chest x-ray showed cardiomegaly and bilateral pulmonary congestion versus bilateral infiltrates.  WBC 7.9 with normal differential, creatinine 1.0 K4.1 bicarb 24.  Review of Systems: As per HPI otherwise 14 point review of systems negative.    Past  Medical History:  Diagnosis Date   Arthritis    Complication of anesthesia    doesn't wake up well,combative until wife talks with him   Diabetes mellitus without complication    diet controlled    Esophageal reflux    Gout    hx of   H/O hiatal hernia    History of kidney stones    Hypertension    borderline HTNt   Plantar fasciitis    no problems now   PONV (postoperative nausea and vomiting)    Prostate cancer 08/27/2012   gleason 6, volume 59.3 cc   PVC (premature ventricular contraction)    hx of    Skin cancer 2011   melanoma on back, resected    Undescended left testes     Past Surgical History:  Procedure Laterality Date   CYSTOSCOPY WITH RETROGRADE PYELOGRAM, URETEROSCOPY AND STENT PLACEMENT Left 02/18/2016   Procedure: CYSTOSCOPY WITH LEFT RETROGRADE PYELOGRAM, URETEROSCOPY  AND URETERAL  STENT PLACEMENT;  Surgeon: Heloise Purpura, MD;  Location: [facility];  Service: Urology;  Laterality: Left;   HERNIA REPAIR     INGUINAL HERNIA REPAIR Bilateral 03/16/2015   Procedure: LAPAROSCOPIC REPAIR OF BILATERAL FEMEROL AND  BILATERAL INGUINAL HERNIAS WITH MESH;  Surgeon: Karie Soda, MD;  Location: [facility];  Service: General;  Laterality: Bilateral;   INSERTION OF MESH Right 03/16/2015   Procedure: INSERTION OF MESH;  Surgeon: Karie Soda, MD;  Location: [facility];  Service: General;  Laterality: Right;   KNEE ARTHROPLASTY  05/02/2011   Procedure: COMPUTER ASSISTED TOTAL KNEE ARTHROPLASTY;  Surgeon: Harvie Junior, MD;  Location: [facility];  Service: Orthopedics;  Laterality: Right;   KNEE ARTHROSCOPY     right   LAPAROSCOPIC LYSIS OF ADHESIONS  03/16/2015   Procedure: LAPAROSCOPIC LYSIS OF ADHESIONS;  Surgeon: Karie Soda, MD;  Location: [facility];  Service: General;;   MELANOMA EXCISION     stage 3+ lower left lumbar region with axillary node removal   MOHS SURGERY     mose surgery     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 His smokeless tobacco use includes chew. He reports that he does not drink alcohol and does not use drugs.  Allergies  Allergen Reactions   Erythrocin Diarrhea   Erythromycin Diarrhea   Lisinopril Cough   Nsaids Other (See Comments)    Gi bleeding Has tolerated limited, small amounts of Meloxicam   Oxycodone Other (See Comments)    Became a "wild person" and had total amnesia    Family History  Problem Relation Age of Onset   Congestive Heart Failure Mother    Diabetes Mother    Hypertension Mother    Diabetes Father    Hypertension Father    Stroke Father    Cancer Father        prostate, pancreas   Cancer Brother 32       prostate- xrt, seed implant   Anesthesia problems Neg Hx    Hypotension Neg Hx    Malignant hyperthermia Neg Hx    Pseudochol deficiency Neg Hx      Prior to Admission medications   Medication Sig Start Date End Date Taking? Authorizing Provider  acetaminophen (TYLENOL) 500 MG tablet Take 500-1,000 mg by mouth every 6 (six) hours as needed for fever (for pain.).    Yes [provider]  B Complex Vitamins (VITAMIN B COMPLEX) TABS Take 1 tablet by mouth daily as needed (low energy).   Yes [provider]  Cholecalciferol (VITAMIN D-3) 1000 UNITS CAPS Take 1,000 Units by mouth daily.    Yes [provider]  colchicine 0.6 MG tablet Take 0.6 mg by mouth 2 (two) times daily as needed. 02/16/20  Yes [provider]  diphenhydrAMINE (BENADRYL) 12.5 MG/5ML elixir Take 3.125 mg by mouth 4 (four) times daily as needed (for allergies.).   Yes [provider]  Multiple Vitamins-Minerals (ICAPS  AREDS 2 PO) Take 1 capsule by mouth 2 (two) times daily.   Yes [provider]  Probiotic Product (PROBIOTIC DAILY PO) Take 1 tablet by mouth daily.   Yes [provider]  sacubitril-valsartan (ENTRESTO) 49-51 MG TAKE 1 TABLET BY MOUTH TWICE DAILY 04/21/22  Yes Rollene Rotunda, MD  Semaglutide,0.25 or 0.5MG /DOS, (OZEMPIC, 0.25 OR 0.5 MG/DOSE,) 2 MG/1.5ML SOPN Inject 0.5 mg into the skin once a week.   Yes [provider]  flecainide (TAMBOCOR) 100 MG tablet TAKE 1 TABLET(100 MG) BY MOUTH TWICE DAILY Patient not taking: Reported on 06/23/2022 04/21/22   Regan Lemming, MD    Physical Exam: Vitals:   06/23/22 1230 06/23/22 1445 06/23/22 1500 06/23/22 1507  BP: 120/81 105/87 123/82  Pulse: 90 89 92   Resp: 18 16 (!) 22   Temp:    98.2 F (36.8 C)  TempSrc:    Oral  SpO2: 95% 98% 93%   Weight:      Height:        Constitutional: NAD, calm, comfortable Vitals:   06/23/22 1230 06/23/22 1445 06/23/22 1500 06/23/22 1507  BP: 120/81 105/87 123/82   Pulse: 90 89 92   Resp: 18 16 (!) 22   Temp:    98.2 F (36.8 C)  TempSrc:    Oral  SpO2: 95% 98% 93%   Weight:      Height:       Eyes: PERRL, lids and conjunctivae normal ENMT: Mucous membranes are moist. Posterior pharynx clear of any exudate or lesions.Normal dentition.  Neck: normal, supple, no masses, no thyromegaly Respiratory: clear to auscultation bilaterally, no wheezing, scattered crackles on bilateral lower bases, increasing breathing effort. No accessory muscle use.  Cardiovascular: Regular rate and rhythm, no murmurs / rubs / gallops. No extremity edema. 2+ pedal pulses. No carotid bruits.  Abdomen: no tenderness, no masses palpated. No hepatosplenomegaly. Bowel sounds positive.  Musculoskeletal: no clubbing / cyanosis. No joint deformity upper and lower extremities. Good ROM, no contractures. Normal muscle tone.  Skin: no rashes, lesions, ulcers. No induration Neurologic: CN 2-12 grossly  intact. Sensation intact, DTR normal. Strength 5/5 in all 4.  Psychiatric: Normal judgment and insight. Alert and oriented x 3. Normal mood.     Labs on Admission: I have personally reviewed following labs and imaging studies  CBC: Recent Labs  Lab 06/23/22 1028  WBC 7.9  HGB 13.2  HCT 40.0  MCV 88.5  PLT 265   Basic Metabolic Panel: Recent Labs  Lab 06/23/22 1028  NA 137  K 4.1  CL 103  CO2 24  GLUCOSE 129*  BUN 12  CREATININE 1.03  CALCIUM 8.9   GFR: Estimated Creatinine Clearance: 69.3 mL/min (by C-G formula based on SCr of 1.03 mg/dL). Liver Function Tests: No results for input(s): "AST", "ALT", "ALKPHOS", "BILITOT", "PROT", "ALBUMIN" in the last 168 hours. No results for input(s): "LIPASE", "AMYLASE" in the last 168 hours. No results for input(s): "AMMONIA" in the last 168 hours. Coagulation Profile: No results for input(s): "INR", "PROTIME" in the last 168 hours. Cardiac Enzymes: No results for input(s): "CKTOTAL", "CKMB", "CKMBINDEX", "TROPONINI" in the last 168 hours. BNP (last 3 results) Recent Labs    06/20/22 1130  PROBNP 4,042*   HbA1C: No results for input(s): "HGBA1C" in the last 72 hours. CBG: No results for input(s): "GLUCAP" in the last 168 hours. Lipid Profile: No results for input(s): "CHOL", "HDL", "LDLCALC", "TRIG", "CHOLHDL", "LDLDIRECT" in the last 72 hours. Thyroid Function Tests: No results for input(s): "TSH", "T4TOTAL", "FREET4", "T3FREE", "THYROIDAB" in the last 72 hours. Anemia Panel: No results for input(s): "VITAMINB12", "FOLATE", "FERRITIN", "TIBC", "IRON", "RETICCTPCT" in the last 72 hours. Urine analysis:    Component Value Date/Time   COLORURINE YELLOW 05/08/2011 1632   APPEARANCEUR CLEAR 05/08/2011 1632   LABSPEC 1.017 05/08/2011 1632   PHURINE 6.0 05/08/2011 1632   GLUCOSEU 100 (A) 05/08/2011 1632   HGBUR NEGATIVE 05/08/2011 1632   BILIRUBINUR NEGATIVE 05/08/2011 1632   KETONESUR NEGATIVE 05/08/2011 1632    PROTEINUR NEGATIVE 05/08/2011 1632   UROBILINOGEN 1.0 05/08/2011 1632   NITRITE NEGATIVE 05/08/2011 1632   LEUKOCYTESUR NEGATIVE 05/08/2011 1632    Radiological Exams on Admission: DG Chest 2 View  Result Date: 06/23/2022 CLINICAL DATA:  Shortness of breath EXAM: CHEST - 2 VIEW COMPARISON:  10/27/2012 FINDINGS: Upper normal heart size. Mediastinal contours and pulmonary vascularity normal. Patchy BILATERAL pulmonary infiltrates asymmetrically greater on LEFT consistent with multifocal pneumonia. No pleural effusion or pneumothorax. Osseous demineralization. IMPRESSION: Patchy BILATERAL pulmonary infiltrates greater on LEFT consistent with multifocal pneumonia. Electronically Signed   By: Ulyses Southward M.D.   On: 06/23/2022 11:00    EKG: Independently reviewed.  Sinus rhythm with frequent PVCs.  Assessment/Plan Principal Problem:   CHF (congestive heart failure) Active Problems:   PVC's (premature ventricular contractions)   Dilated cardiomyopathy   CHF (congestive heart failure), NYHA class II  (please populate well all problems here in Problem List. (For example, if patient is on BP meds at home and you resume or decide to hold them, it is a problem that needs to be her. Same for CAD, COPD, HLD and so on)   Acute on chronic HFrEF decompensation -Has symptoms signs of volume overloaded, start diuresis Lasix 40 mg IV daily -Case discussed with on-call cardiology -Echocardiogram -Repeat x-ray in AM and Daily BMP -Other Ddx, bilateral multifocal pneumonia unlikely given the clinical presentation symptoms ongoing for 2+ weeks, with symptoms signs of fluid overload more compatible with CHF, normal CBC and differential and afebrile arguing against multifocal pneumonia.  Hold off antibiotics.  Repeat chest x-ray tomorrow after diuresis. -Continue Entresto  Frequent PVCs -Continue to hold flecainide, cardiology will decide further management plan. -TSH  DVT prophylaxis: Lovenox Code Status:  Full code Family Communication: Wife at bedside Disposition Plan: Expect less than 2 midnight hospital stay Consults called: Cardiology Admission status: Telemetry observation   Emeline General MD [facility] Pager [phone]  06/23/2022, 3:30 PM

## 2022-06-23 NOTE — ED Provider Notes (Signed)
[place] EMERGENCY DEPARTMENT AT [facility] Provider Note   CSN: [id number] Arrival date & time: 06/23/22  4098     History  Chief Complaint  Patient presents with   Shortness of Breath   Chest Pain    Arthur Klein is a 77 y.o. male.   Shortness of Breath Associated symptoms: chest pain   Chest Pain Associated symptoms: shortness of breath   Patient presents with shortness of breath.  Has had for the last few months but worse last few days.  Has been on flecainide for PVCs.  Saw EP yesterday and had proBNP that was elevated.  States he is unable to sleep flat.  States more shortness of breath.  Is had a cough with out much sputum production.     Home Medications Prior to Admission medications   Medication Sig Start Date End Date Taking? Authorizing Provider  acetaminophen (TYLENOL) 500 MG tablet Take 500-1,000 mg by mouth every 6 (six) hours as needed for fever (for pain.).    Yes [provider]  B Complex Vitamins (VITAMIN B COMPLEX) TABS Take 1 tablet by mouth daily as needed (low energy).   Yes [provider]  Cholecalciferol (VITAMIN D-3) 1000 UNITS CAPS Take 1,000 Units by mouth daily.    Yes [provider]  colchicine 0.6 MG tablet Take 0.6 mg by mouth 2 (two) times daily as needed. 02/16/20  Yes [provider]  diphenhydrAMINE (BENADRYL) 12.5 MG/5ML elixir Take 3.125 mg by mouth 4 (four) times daily as needed (for allergies.).   Yes [provider]  Multiple Vitamins-Minerals (ICAPS AREDS 2 PO) Take 1 capsule by mouth 2 (two) times daily.   Yes [provider]  Probiotic Product (PROBIOTIC DAILY PO) Take 1 tablet by mouth daily.   Yes [provider]  sacubitril-valsartan (ENTRESTO) 49-51 MG TAKE 1 TABLET BY MOUTH TWICE DAILY 04/21/22  Yes Rollene Rotunda, MD  Semaglutide,0.25 or 0.5MG /DOS, (OZEMPIC, 0.25 OR 0.5 MG/DOSE,) 2 MG/1.5ML SOPN Inject 0.5 mg into the skin once a week.   Yes [provider]  flecainide (TAMBOCOR) 100 MG tablet TAKE 1 TABLET(100 MG) BY MOUTH TWICE DAILY Patient not taking: Reported on 06/23/2022 04/21/22   Regan Lemming, MD      Allergies    Erythrocin, Erythromycin, Lisinopril, Nsaids, and Oxycodone    Review of Systems   Review of Systems  Respiratory:  Positive for shortness of breath.   Cardiovascular:  Positive for chest pain.    Physical Exam Updated Vital Signs BP 120/81   Pulse 90   Temp 98 F (36.7 C) (Oral)   Resp 18   Ht  (1.727 m)   Wt 92.1 kg   SpO2 95%   BMI 30.87 kg/m  Physical Exam Vitals and nursing note reviewed.  Cardiovascular:     Rate and Rhythm: Regular rhythm.  Pulmonary:     Comments: Diffuse harsh breath sounds without focal rales or rhonchi. Chest:     Chest wall: No tenderness.  Musculoskeletal:     Right lower leg: No edema.     Left lower leg: No edema.  Skin:    General: Skin is warm.     Capillary Refill: Capillary refill takes less than 2 seconds.  Neurological:     Mental Status: He is alert and oriented to person, place, and time.     ED Results / Procedures / Treatments   Labs (all labs ordered are listed, but only abnormal results  are displayed) Labs Reviewed  BASIC METABOLIC PANEL - Abnormal; Notable for the following components:      Result Value   Glucose, Bld 129 (*)    All other components within normal limits  BRAIN NATRIURETIC PEPTIDE - Abnormal; Notable for the following components:   B Natriuretic Peptide 1,408.5 (*)    All other components within normal limits  SARS CORONAVIRUS 2 BY RT PCR  CBC  TROPONIN I (HIGH SENSITIVITY)  TROPONIN I (HIGH SENSITIVITY)    EKG EKG Interpretation  Date/Time:  Monday June 23 2022 10:21:35 EDT Ventricular Rate:  93 PR Interval:    QRS Duration: 88 QT Interval:  360 QTC Calculation: 447 R Axis:   41 Text Interpretation: sinus with PAcs Nonspecific ST and T wave abnormality Abnormal ECG When compared with ECG of  15-Feb-2016 08:39,PACS new Confirmed by Benjiman Core [phone]) on 06/23/2022 2:40:09 PM  Radiology DG Chest 2 View  Result Date: 06/23/2022 CLINICAL DATA:  Shortness of breath EXAM: CHEST - 2 VIEW COMPARISON:  10/27/2012 FINDINGS: Upper normal heart size. Mediastinal contours and pulmonary vascularity normal. Patchy BILATERAL pulmonary infiltrates asymmetrically greater on LEFT consistent with multifocal pneumonia. No pleural effusion or pneumothorax. Osseous demineralization. IMPRESSION: Patchy BILATERAL pulmonary infiltrates greater on LEFT consistent with multifocal pneumonia. Electronically Signed   By: Ulyses Southward M.D.   On: 06/23/2022 11:00    Procedures Procedures    Medications Ordered in ED Medications - No data to display  ED Course/ Medical Decision Making/ A&P                             Medical Decision Making Amount and/or Complexity of Data Reviewed Labs: ordered. Radiology: ordered.   Patient shortness of breath cough and feeling bad.  Has had for weeks to months.  However more short of breath.  Went to see his new electrophysiologist and had BNP done that was elevated.  Sent in.  Has difficulty laying flat due to difficulty breathing.  Cannot do the same activity as before.  Chest x-ray shows pneumonia versus potentially edema.  With some dyspnea and elevated BNP I feel he will benefit from admission to the hospital for further workup.  Will discussed with hospitalist.        Final Clinical Impression(s) / ED Diagnoses Final diagnoses:  Congestive heart failure, unspecified HF chronicity, unspecified heart failure type    Rx / DC Orders ED Discharge Orders     None         Benjiman Core, MD 06/23/22 1441

## 2022-06-23 NOTE — Telephone Encounter (Signed)
Spoke to patient's wife she stated husband is sob.Stated he saw Dr.Camnitz at the [place] office this past Friday.Stated she is worried about him.Hard for him to walk from room to room without being sob.No chest pain.Stated BNP level elevated.Advised he needs to go to [facility] ED to be evaluated.I will make Dr.Camnitz aware.

## 2022-06-23 NOTE — ED Notes (Signed)
Family updated on plan of care per provider notes. Family will be staying with the patient tonight. Patient comfortable at this time.

## 2022-06-23 NOTE — Telephone Encounter (Signed)
Pt's wife is requesting call back to discuss labs and medication. Please advise.

## 2022-06-23 NOTE — Consult Note (Signed)
Cardiology Consultation   Patient ID: Arthur Klein MRN: [id number]; DOB: 1945-10-07  Admit date: 06/23/2022 Date of Consult: 06/23/2022  PCP:  Alysia Penna, MD   [facility] Providers Cardiologist:  Rollene Rotunda, MD  Electrophysiologist:  Will Jorja Loa, MD       Patient Profile:   Arthur Klein is a 77 y.o. male with a hx of DM-2, PVCs bradycardia and with cardiac monitor he had 28% burden of PVCs. Echo was done and flecainide given for PVCs with improvement  who is being seen 06/23/2022 for the evaluation of CHF at the request of Dr. Chipper Herb.  History of Present Illness:   Arthur Klein with above hx and last nuc 07/2021 with no ishemia EF low but on Echo it was better.   Echo 07/16/21 with EF 40-45%, global hypokinesis, LV internal mod dilated.  Moderate asymmetric LVH of basal septal segment. RV normal. LA mod dilated.  Mild to mod MR stable echo.   On remote CTA in 2013 there was calcification within the RCA and LAD  Last saw Dr. Elberta Fortis 06/20/22 burden of PVCs with flecainide at 4.9 %  pt had increased SOB and Dr. Elberta Fortis concerned it was due to flecainide so flecainide held for 1 week. Pt with hx of chronic systolic HF   pro BNP on the 19th was 4,042.   Today he presents to ER after results of pro BNP noted.  Pt had been restless throughout the night and complained of some chest heaviness.   EKG:  The EKG was personally reviewed and demonstrates:  SR with freq PACs HR 93 Telemetry:  Telemetry was personally reviewed and demonstrates:  SR with PACs  Na 137 K+ 4.1 glucose 129 Cr 1.03  BNP 1,408 Hs troponin 17 and 16 Hgb 13.2 WBC 7.9 plts 265 Neg COVID   BP 110/72 P 97 R 22 afebrile. Past Medical History:  Diagnosis Date   Arthritis    Complication of anesthesia    doesn't wake up well,combative until wife talks with him   Diabetes mellitus without complication    diet controlled    Esophageal reflux    Gout    hx of   H/O hiatal hernia    History of  kidney stones    Hypertension    borderline HTNt   Plantar fasciitis    no problems now   PONV (postoperative nausea and vomiting)    Prostate cancer 08/27/2012   gleason 6, volume 59.3 cc   PVC (premature ventricular contraction)    hx of    Skin cancer 2011   melanoma on back, resected    Undescended left testes     Past Surgical History:  Procedure Laterality Date   CYSTOSCOPY WITH RETROGRADE PYELOGRAM, URETEROSCOPY AND STENT PLACEMENT Left 02/18/2016   Procedure: CYSTOSCOPY WITH LEFT RETROGRADE PYELOGRAM, URETEROSCOPY  AND URETERAL  STENT PLACEMENT;  Surgeon: Heloise Purpura, MD;  Location: [facility];  Service: Urology;  Laterality: Left;   HERNIA REPAIR     INGUINAL HERNIA REPAIR Bilateral 03/16/2015   Procedure: LAPAROSCOPIC REPAIR OF BILATERAL FEMEROL AND  BILATERAL INGUINAL HERNIAS WITH MESH;  Surgeon: Karie Soda, MD;  Location: [facility];  Service: General;  Laterality: Bilateral;   INSERTION OF MESH Right 03/16/2015   Procedure: INSERTION OF MESH;  Surgeon: Karie Soda, MD;  Location: [facility];  Service: General;  Laterality: Right;   KNEE ARTHROPLASTY  05/02/2011   Procedure: COMPUTER ASSISTED TOTAL KNEE ARTHROPLASTY;  Surgeon: Harvie Junior, MD;  Location: [facility];  Service: Orthopedics;  Laterality: Right;   KNEE ARTHROSCOPY     right   LAPAROSCOPIC LYSIS OF ADHESIONS  03/16/2015   Procedure: LAPAROSCOPIC LYSIS OF ADHESIONS;  Surgeon: Karie Soda, MD;  Location: [facility];  Service: General;;   MELANOMA EXCISION     stage 3+ lower left lumbar region with axillary node removal   MOHS SURGERY     mose surgery     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 Authorizing Provider  acetaminophen (TYLENOL) 500 MG tablet Take 500-1,000 mg by mouth every 6 (six) hours as needed for fever (for pain.).    Yes [provider]  B Complex Vitamins (VITAMIN B COMPLEX) TABS Take 1 tablet by mouth daily as needed (low energy).   Yes [provider]  Cholecalciferol (VITAMIN D-3) 1000 UNITS CAPS Take 1,000 Units by mouth daily.    Yes [provider]  colchicine 0.6 MG tablet Take 0.6 mg by mouth 2 (two) times daily as needed. 02/16/20  Yes [provider]  diphenhydrAMINE (BENADRYL) 12.5 MG/5ML elixir Take 3.125 mg by mouth 4 (four) times daily as needed (for allergies.).   Yes [provider]  Multiple Vitamins-Minerals (ICAPS AREDS 2 PO) Take 1 capsule by mouth 2 (two) times daily.   Yes [provider]  Probiotic Product (PROBIOTIC DAILY PO) Take 1 tablet by mouth daily.   Yes [provider]  sacubitril-valsartan (ENTRESTO) 49-51 MG TAKE 1 TABLET BY MOUTH TWICE DAILY 04/21/22  Yes Rollene Rotunda, MD  Semaglutide,0.25 or 0.5MG /DOS, (OZEMPIC, 0.25 OR 0.5 MG/DOSE,) 2 MG/1.5ML SOPN Inject 0.5 mg into the skin once a week.   Yes [provider]  flecainide (TAMBOCOR) 100 MG tablet TAKE 1 TABLET(100 MG) BY MOUTH TWICE DAILY Patient not taking: Reported on 06/23/2022 04/21/22   Regan Lemming, MD    Inpatient Medications: Scheduled Meds:  [START ON 06/24/2022] acidophilus  1 capsule Oral Daily   enoxaparin (LOVENOX) injection  40 mg Subcutaneous Q24H   guaiFENesin  1,200 mg Oral BID   sacubitril-valsartan  1 tablet Oral BID   sodium chloride flush  3 mL Intravenous Q12H   Continuous Infusions:  sodium chloride     PRN Meds: sodium chloride, acetaminophen, colchicine, diphenhydrAMINE, ondansetron (ZOFRAN) IV, sodium chloride flush  Allergies:    Allergies   Allergen Reactions   Erythrocin Diarrhea   Erythromycin Diarrhea   Lisinopril Cough   Nsaids Other (See Comments)    Gi bleeding Has tolerated limited, small amounts of Meloxicam   Oxycodone Other (See Comments)    Became a "wild person" and had total amnesia    Social History:   Social History   Socioeconomic History   Marital status: Married    Spouse name: Not on file   Number of children: Not on file   Years of education: Not on file   Highest education level: Not on file  Occupational History   Not on file  Tobacco Use   Smoking status: Never   Smokeless tobacco: Current    Types: Chew   Tobacco comments:    Patient declines materials to quit chewing tobacco.  Substance and Sexual Activity   Alcohol use: No   Drug use: No   Sexual activity: Yes  Other Topics Concern   Not on file  Social History Narrative   Lives with wife.   Three children.     Social Determinants of Health   Financial Resource Strain: Not on file  Food Insecurity: Not on file  Transportation Needs: Not on file  Physical Activity: Not on file  Stress: Not on file  Social Connections: Not on file  Intimate Partner Violence: Not on file    Family History:    Family History  Problem Relation Age of Onset   Congestive Heart Failure Mother    Diabetes Mother    Hypertension Mother    Diabetes Father    Hypertension Father    Stroke Father    Cancer Father        prostate, pancreas   Cancer Brother 32       prostate- xrt, seed implant   Anesthesia problems Neg Hx    Hypotension Neg Hx    Malignant hyperthermia Neg Hx    Pseudochol deficiency Neg Hx      ROS:  Please see the history of present illness.  General:no colds or fevers, no weight changes Skin:no rashes or ulcers HEENT:no blurred vision, no congestion CV:see HPI PUL:see HPI  + SOB GI:no diarrhea constipation or melena, no indigestion GU:no hematuria, no dysuria MS:no joint pain, no claudication Neuro:no syncope,  no lightheadedness Endo:no diabetes, no thyroid disease  All other ROS reviewed and negative.     Physical Exam/Data:   Vitals:   06/23/22 1500 06/23/22 1507 06/23/22 1900 06/23/22 1915  BP: 123/82  103/87 110/72  Pulse: 92   97  Resp: (!) 22   (!) 22  Temp:  98.2 F (36.8 C)  98.4 F (36.9 C)  TempSrc:  Oral    SpO2: 93%   94%  Weight:      Height:        Intake/Output Summary (Last 24 hours) at 06/23/2022 1928 Last data filed at 06/23/2022 1820 Gross per 24 hour  Intake --  Output 650 ml  Net -650 ml      06/23/2022   10:19 AM 06/20/2022   11:06 AM 01/13/2022   11:51 AM  Last 3 Weights  Weight (lbs) 203 lb 0.7 oz 203 lb 206 lb 9.6 oz  Weight (kg) 92.1 kg 92.08 kg 93.713 kg     Body mass index is 30.87 kg/m.   Exam  General:  Well nourished, well developed, in no acute distress HEENT: normal Neck: no JVD Vascular: No carotid bruits; Distal pulses 2+ bilaterally Cardiac:  normal S1, S2; RRR; no murmur  Lungs:  basilar rales   Abd: soft, nontender, no hepatomegaly  Ext: no edema Musculoskeletal:  No deformities, BUE and BLE strength normal and equal Skin: warm and dry  Neuro:  CNs 2-12 intact, no focal abnormalities noted Psych:  Normal affect   Relevant CV Studies:  Echo 07/16/21 IMPRESSIONS     1. Left ventricular ejection fraction, by estimation, is 40 to 45%. Left  ventricular ejection fraction by PLAX is 41 %. The left ventricle has  mildly decreased function. The left ventricle demonstrates global  hypokinesis. The left ventricular internal  cavity size was moderately dilated.  There is moderate asymmetric left  ventricular hypertrophy of the basal-septal segment. Left ventricular  diastolic function could not be evaluated.   2. Right ventricular systolic function is normal. The right ventricular  size is normal. Tricuspid regurgitation signal is inadequate for assessing  PA pressure.   3. Left atrial size was moderately dilated.   4. The mitral  valve is abnormal. Mild to moderate mitral valve  regurgitation.   5. The aortic valve is tricuspid. Aortic valve regurgitation is not  visualized. Mild aortic valve stenosis. Aortic valve mean gradient  measures 16.5 mmHg. Aortic valve Vmax measures 2.76 m/s.   6. The inferior vena cava is normal in size with <50% respiratory  variability, suggesting right atrial pressure of 8 mmHg.   Comparison(s): No significant change from prior study. 02/27/2021: LVEF  40-45%.   FINDINGS   Left Ventricle: Left ventricular ejection fraction, by estimation, is 40  to 45%. Left ventricular ejection fraction by PLAX is 41 %. The left  ventricle has mildly decreased function. The left ventricle demonstrates  global hypokinesis. The left  ventricular internal cavity size was moderately dilated. There is moderate  asymmetric left ventricular hypertrophy of the basal-septal segment. Left  ventricular diastolic function could not be evaluated due to indeterminate  diastolic function. Left  ventricular diastolic function could not be evaluated.   Right Ventricle: The right ventricular size is normal. No increase in  right ventricular wall thickness. Right ventricular systolic function is  normal. Tricuspid regurgitation signal is inadequate for assessing PA  pressure.   Left Atrium: Left atrial size was moderately dilated.   Right Atrium: Right atrial size was normal in size.   Pericardium: There is no evidence of pericardial effusion.   Mitral Valve: The mitral valve is abnormal. There is mild thickening of  the anterior and posterior mitral valve leaflet(s). Mild to moderate  mitral valve regurgitation.   Tricuspid Valve: The tricuspid valve is grossly normal. Tricuspid valve  regurgitation is trivial.   Aortic Valve: The aortic valve is tricuspid. Aortic valve regurgitation is  not visualized. Mild aortic stenosis is present. Aortic valve mean  gradient measures 16.5 mmHg. Aortic valve peak  gradient measures 30.4  mmHg.   Pulmonic Valve: The pulmonic valve was normal in structure. Pulmonic valve  regurgitation is not visualized.   Aorta: The aortic root and ascending aorta are structurally normal, with  no evidence of dilitation.   Venous: The inferior vena cava is normal in size with less than 50%  respiratory variability, suggesting right atrial pressure of 8 mmHg.   IAS/Shunts: No atrial level shunt detected by color flow Doppler.      Laboratory Data:  High Sensitivity Troponin:   Recent Labs  Lab 06/23/22 1028 06/23/22 1222  TROPONINIHS 17 16     Chemistry Recent Labs  Lab 06/23/22 1028  NA 137  K 4.1  CL 103  CO2 24  GLUCOSE 129*  BUN 12  CREATININE 1.03  CALCIUM 8.9  GFRNONAA >60  ANIONGAP 10    No results for input(s): "PROT", "ALBUMIN", "AST", "ALT", "ALKPHOS", "BILITOT" in the last 168 hours. Lipids No results for input(s): "CHOL", "TRIG", "HDL", "LABVLDL", "LDLCALC", "CHOLHDL" in the last 168 hours.  Hematology Recent Labs  Lab 06/23/22 1028  WBC 7.9  RBC 4.52  HGB 13.2  HCT 40.0  MCV 88.5  MCH 29.2  MCHC 33.0  RDW 13.3  PLT 265   Thyroid No results for input(s): "TSH", "FREET4" in the last 168 hours.  BNP Recent Labs  Lab 06/20/22 1130 06/23/22 1028  BNP  --  1,408.5*  PROBNP 4,042*  --     DDimer No results for input(s): "DDIMER" in the last 168 hours.   Radiology/Studies:  DG Chest 2 View  Result Date: 06/23/2022 CLINICAL DATA:  Shortness of breath EXAM: CHEST - 2 VIEW COMPARISON:  10/27/2012 FINDINGS: Upper normal heart size. Mediastinal contours and pulmonary vascularity normal. Patchy BILATERAL pulmonary infiltrates asymmetrically greater on LEFT consistent with multifocal pneumonia. No pleural effusion or pneumothorax. Osseous demineralization. IMPRESSION: Patchy BILATERAL pulmonary infiltrates greater on LEFT consistent with multifocal pneumonia. Electronically Signed   By: Ulyses Southward M.D.   On: 06/23/2022 11:00      Assessment and Plan:   Acute on chronic HFp/rEF has rec'd Lasix 40 IV  neg 650, needs echo - had mild chest pressure with neg troponin.  No  hx of cath neg nuc in 07/2021, continue diuresing, Echo ordered.  If no improvement may need cath depending on Echo. Continue Entresto  PVC;s:  Likely CHF exacerbation from Flecainide Started with known EF 40-45% will d/c EP to see in am but amiodarone likely best drug to start Suspect even though his nuclear study was negative May 2023 he would have considerable calcium on CTWill need to have repeat monitoring on alternative AAT to make sure PVCls suppressed as they may have led to initial DCM.  Alternative would be EP mapping and ablation Dr Camnitz/EP to see in am   Charlton Haws MD [facility]

## 2022-06-23 NOTE — ED Provider Triage Note (Signed)
Emergency Medicine Provider Triage Evaluation Note  Arthur Klein , a 77 y.o. male  was evaluated in triage.  Pt complains of shortness of breath.  Patient reports he has had extensive shortness of breath in the last 6 months, worse in the last 4 days.  Patient was seen at his electrophysiologist office on Friday, had BNP drawn which is in the 4000's.  Patient denies any chest pain, fevers, nausea, vomiting.  Patient denies leg swelling or abdominal distention.  Patient denies taking Lasix.  Patient reports he was seen by Dr. Elberta Fortis on Friday and taken off of his flecainide.  Patient states that if he attempted to walk from here to the parking lot he would be "slap out of breath".  Recent echocardiogram 07/16/2021 shows ejection fraction 40 to 45%.  Review of Systems  Positive:  Negative:   Physical Exam  BP 118/89 (BP Location: Right Arm)   Pulse 83   Temp 98.3 F (36.8 C)   Resp 18   Ht  (1.727 m)   Wt 92.1 kg   SpO2 95%   BMI 30.87 kg/m  Gen:   Awake, no distress   Resp:  Normal effort  MSK:   Moves extremities without difficulty  Other:    Medical Decision Making  Medically screening exam initiated at 10:37 AM.  Appropriate orders placed.  Arthur Klein was informed that the remainder of the evaluation will be completed by another provider, this initial triage assessment does not replace that evaluation, and the importance of remaining in the ED until their evaluation is complete.     Al Decant, PA-C 06/23/22 1038

## 2022-06-24 ENCOUNTER — Observation Stay (HOSPITAL_COMMUNITY): Payer: Medicare Other

## 2022-06-24 ENCOUNTER — Inpatient Hospital Stay (HOSPITAL_COMMUNITY): Payer: Medicare Other

## 2022-06-24 DIAGNOSIS — I5033 Acute on chronic diastolic (congestive) heart failure: Secondary | ICD-10-CM

## 2022-06-24 DIAGNOSIS — I2510 Atherosclerotic heart disease of native coronary artery without angina pectoris: Secondary | ICD-10-CM | POA: Diagnosis present

## 2022-06-24 DIAGNOSIS — Z888 Allergy status to other drugs, medicaments and biological substances status: Secondary | ICD-10-CM | POA: Diagnosis not present

## 2022-06-24 DIAGNOSIS — I110 Hypertensive heart disease with heart failure: Secondary | ICD-10-CM | POA: Diagnosis present

## 2022-06-24 DIAGNOSIS — Z1152 Encounter for screening for COVID-19: Secondary | ICD-10-CM | POA: Diagnosis not present

## 2022-06-24 DIAGNOSIS — Z8546 Personal history of malignant neoplasm of prostate: Secondary | ICD-10-CM | POA: Diagnosis not present

## 2022-06-24 DIAGNOSIS — Z7985 Long-term (current) use of injectable non-insulin antidiabetic drugs: Secondary | ICD-10-CM | POA: Diagnosis not present

## 2022-06-24 DIAGNOSIS — I5021 Acute systolic (congestive) heart failure: Secondary | ICD-10-CM | POA: Diagnosis not present

## 2022-06-24 DIAGNOSIS — M109 Gout, unspecified: Secondary | ICD-10-CM | POA: Diagnosis present

## 2022-06-24 DIAGNOSIS — I420 Dilated cardiomyopathy: Secondary | ICD-10-CM | POA: Diagnosis present

## 2022-06-24 DIAGNOSIS — Z823 Family history of stroke: Secondary | ICD-10-CM | POA: Diagnosis not present

## 2022-06-24 DIAGNOSIS — Z833 Family history of diabetes mellitus: Secondary | ICD-10-CM | POA: Diagnosis not present

## 2022-06-24 DIAGNOSIS — Z886 Allergy status to analgesic agent status: Secondary | ICD-10-CM | POA: Diagnosis not present

## 2022-06-24 DIAGNOSIS — Z8249 Family history of ischemic heart disease and other diseases of the circulatory system: Secondary | ICD-10-CM | POA: Diagnosis not present

## 2022-06-24 DIAGNOSIS — I5043 Acute on chronic combined systolic (congestive) and diastolic (congestive) heart failure: Secondary | ICD-10-CM | POA: Diagnosis present

## 2022-06-24 DIAGNOSIS — E785 Hyperlipidemia, unspecified: Secondary | ICD-10-CM | POA: Diagnosis present

## 2022-06-24 DIAGNOSIS — E1165 Type 2 diabetes mellitus with hyperglycemia: Secondary | ICD-10-CM | POA: Diagnosis present

## 2022-06-24 DIAGNOSIS — K219 Gastro-esophageal reflux disease without esophagitis: Secondary | ICD-10-CM | POA: Diagnosis present

## 2022-06-24 DIAGNOSIS — Z96651 Presence of right artificial knee joint: Secondary | ICD-10-CM | POA: Diagnosis present

## 2022-06-24 DIAGNOSIS — Z85820 Personal history of malignant melanoma of skin: Secondary | ICD-10-CM | POA: Diagnosis not present

## 2022-06-24 DIAGNOSIS — I509 Heart failure, unspecified: Secondary | ICD-10-CM | POA: Diagnosis present

## 2022-06-24 DIAGNOSIS — Z79899 Other long term (current) drug therapy: Secondary | ICD-10-CM | POA: Diagnosis not present

## 2022-06-24 DIAGNOSIS — Z87891 Personal history of nicotine dependence: Secondary | ICD-10-CM | POA: Diagnosis not present

## 2022-06-24 DIAGNOSIS — Z883 Allergy status to other anti-infective agents status: Secondary | ICD-10-CM | POA: Diagnosis not present

## 2022-06-24 DIAGNOSIS — I493 Ventricular premature depolarization: Secondary | ICD-10-CM | POA: Diagnosis present

## 2022-06-24 LAB — ECHOCARDIOGRAM COMPLETE
AR max vel: 1.29 cm2
AV Area VTI: 1.33 cm2
AV Area mean vel: 1.22 cm2
AV Mean grad: 8.3 mmHg
AV Peak grad: 14.8 mmHg
Ao pk vel: 1.93 m/s
Area-P 1/2: 4.31 cm2
Calc EF: 30.3 %
Height: 69 in
MV M vel: 4.43 m/s
MV Peak grad: 78.5 mmHg
MV VTI: 1.65 cm2
P 1/2 time: 434 msec
Radius: 0.5 cm
S' Lateral: 5.9 cm
Single Plane A2C EF: 32.2 %
Single Plane A4C EF: 27.5 %
Weight: 3104.08 oz

## 2022-06-24 LAB — BASIC METABOLIC PANEL
Anion gap: 11 (ref 5–15)
BUN: 15 mg/dL (ref 8–23)
CO2: 24 mmol/L (ref 22–32)
Calcium: 9 mg/dL (ref 8.9–10.3)
Chloride: 102 mmol/L (ref 98–111)
Creatinine, Ser: 0.98 mg/dL (ref 0.61–1.24)
GFR, Estimated: >60 mL/min (ref >60)
Glucose, Bld: 103 mg/dL — ABNORMAL HIGH (ref 70–99)
Potassium: 3.7 mmol/L (ref 3.5–5.1)
Sodium: 137 mmol/L (ref 135–145)

## 2022-06-24 LAB — MAGNESIUM: Magnesium: 2.3 mg/dL (ref 1.7–2.4)

## 2022-06-24 LAB — TSH: TSH: 2.937 u[IU]/mL (ref 0.350–4.500)

## 2022-06-24 MED ORDER — FUROSEMIDE 10 MG/ML IJ SOLN
60.0000 mg | Freq: Once | INTRAMUSCULAR | Status: AC
  Administered 2022-06-24: 60 mg via INTRAVENOUS
  Filled 2022-06-24: qty 6

## 2022-06-24 MED ORDER — PERFLUTREN LIPID MICROSPHERE
1.0000 mL | INTRAVENOUS | Status: AC | PRN
  Administered 2022-06-24: 4 mL via INTRAVENOUS

## 2022-06-24 MED ORDER — POTASSIUM CHLORIDE CRYS ER 20 MEQ PO TBCR
40.0000 meq | EXTENDED_RELEASE_TABLET | Freq: Once | ORAL | Status: AC
  Administered 2022-06-24: 40 meq via ORAL
  Filled 2022-06-24: qty 2

## 2022-06-24 NOTE — Progress Notes (Signed)
  Progress Note   Patient: Arthur Klein ION:[id number] DOB: December 21, 1945 DOA: 06/23/2022     0 DOS: the patient was seen and examined on 06/24/2022   Brief hospital course: Arthur Klein was admitted to the hospital with the working diagnosis of heart failure decompensation.   77 yo male with the past medical history of heart failure, hypertension, ventricular ectopy treated with flecainide and gout who presented with dyspnea. Reported 2 to 3 weeks, of dyspnea on exertion and cough. 3 days prior to hospitalization he was evaluated as outpatient by cardiology and recommended to to stop flecainide. At home his symptoms continue to worsened with progressive dyspnea, lower extremity edema, PND and orthopnea. Positive chest pressure. I called cardiology office and he was advised to come to the ED. On his initial physical examination his blood pressure was 120/81, HR 90, RR 18 and 02 saturation 93%, lungs with rales bilaterally, with increased work of breathing, heart with S1 and S2 present and rhythmic with no gallops, rubs or murmurs, abdomen with no distention, and no lower extremity edema.   Na 137, K 4,1 Cl 103 bicarbonate 24 glucose 129, bun 12 cr 1,0 BNP 1,408  High sensitive troponin 17  Wbc 7,9 hgb 13,2 plt 265  Sars covid 19 negative  Chest radiograph with bilateral hilar vascular congestion, bilateral interstitial infiltrates, predominant at lower lobes, small right pleural effusion.  EKG 93 bpm, normal axis, normal intervals, sinus rhythm with multiple PAC, with no significant ST segment or T wave changes.   Patient was placed on furosemide for diuresis.   Assessment and Plan: * Acute on chronic diastolic CHF (congestive heart failure) Echocardiogram pending, last study with mild reduction in LV systolic function 40 to 40%.  Today with improvement in volume status Urine output is documented at 2,375 ml. Systolic blood pressure is 125 to 101 mmHg.   Telemetry with occasional PAC and PVC  (personally reviewed).   Plan to continue medical therapy with entresto. This morning had one dose of furosemide IV 60 mg.  Continue blood pressure monitoring.  Continue holding dofetilide.  Follow up on echocardiogram.   Gout No acute attack, continue with allopurinol and colchicine.         Subjective: Patient is feeling well this pm, currently is getting his echocardiogram done.  No chest pain, his wife is at the bedside   Physical Exam: Vitals:   06/24/22 0835 06/24/22 0836 06/24/22 1311 06/24/22 1330  BP: 121/83   101/67  Pulse: 87   98  Resp: 19     Temp: 97.7 F (36.5 C)  (!) 97.3 F (36.3 C)   TempSrc: Oral  Oral   SpO2: 95% 95%  95%  Weight:    88 kg  Height:     (1.753 m)   Neurology awake and alert ENT with no pallor or icterus Respiratory with no rales or wheezing, no rhonchi Abdomen with no distention  No lower extremity edema  Data Reviewed:    Family Communication: I spoke with patient's wife at the bedside, we talked in detail about patient's condition, plan of care and prognosis and all questions were addressed.   Disposition: Status is: Inpatient Remains inpatient appropriate because: heart failure   Planned Discharge Destination: Home    Author: Coralie Keens, MD 06/24/2022 4:01 PM  For on call review [email or web address].

## 2022-06-24 NOTE — ED Notes (Signed)
ED TO INPATIENT HANDOFF REPORT  ED Nurse Name and Phone #: [phone]  S Name/Age/Gender Arthur Klein 77 y.o. male Room/Bed: 038C/038C  Code Status   Code Status: Full Code  Home/SNF/Other Home Patient oriented to: self, place, time, and situation Is this baseline? Yes   Triage Complete: Triage complete  Chief Complaint CHF (congestive heart failure) [I50.9]  Triage Note Pt was seen at [facility] office and a blood test was elevated and was told to come here. Pt's wife not sure what the blood work was. Pt was seen due to tachycardia and having several PVCs and tachypeic. Pt is eupneic at present. Pt c/o chest heaviness at 0730 today. Pt's wife states pt was restless throughout the night.   Allergies Allergies  Allergen Reactions   Erythrocin Diarrhea   Erythromycin Diarrhea   Lisinopril Cough   Nsaids Other (See Comments)    Gi bleeding Has tolerated limited, small amounts of Meloxicam   Oxycodone Other (See Comments)    Became a "wild person" and had total amnesia    Level of Care/Admitting Diagnosis ED Disposition     ED Disposition  Admit   Condition  --   Comment  Hospital Area: [facility] [[id number]]  Level of Care: Telemetry Cardiac [[id number]]  May place patient in observation at [facility] or [facility] if equivalent level of care is available:: No  Covid Evaluation: Asymptomatic - no recent exposure (last 10 days) testing not required  Diagnosis: CHF (congestive heart failure) [098119]  Admitting Physician: Emeline General [[id number]]  Attending Physician: Emeline General [[id number]]          B Medical/Surgery History Past Medical History:  Diagnosis Date   Arthritis    Complication of anesthesia    doesn't wake up well,combative until wife talks with him   Diabetes mellitus without complication    diet controlled    Esophageal reflux    Gout    hx of   H/O hiatal hernia    History of kidney stones    Hypertension     borderline HTNt   Plantar fasciitis    no problems now   PONV (postoperative nausea and vomiting)    Prostate cancer 08/27/2012   gleason 6, volume 59.3 cc   PVC (premature ventricular contraction)    hx of    Skin cancer 2011   melanoma on back, resected    Undescended left testes    Past Surgical History:  Procedure Laterality Date   CYSTOSCOPY WITH RETROGRADE PYELOGRAM, URETEROSCOPY AND STENT PLACEMENT Left 02/18/2016   Procedure: CYSTOSCOPY WITH LEFT RETROGRADE PYELOGRAM, URETEROSCOPY  AND URETERAL  STENT PLACEMENT;  Surgeon: Heloise Purpura, MD;  [facility];  Service: Urology;  Laterality: Left;   HERNIA REPAIR     INGUINAL HERNIA REPAIR Bilateral 03/16/2015   Procedure: LAPAROSCOPIC REPAIR OF BILATERAL FEMEROL AND  BILATERAL INGUINAL HERNIAS WITH MESH;  Surgeon: Karie Soda, MD;  [facility];  Service: General;  Laterality: Bilateral;   INSERTION OF MESH Right 03/16/2015   Procedure: INSERTION OF MESH;  Surgeon: Karie Soda, MD;  [facility];  Service: General;  Laterality: Right;   KNEE ARTHROPLASTY  05/02/2011   Procedure: COMPUTER ASSISTED TOTAL KNEE ARTHROPLASTY;  Surgeon: Harvie Junior, MD;  [facility];  Service: Orthopedics;  Laterality: Right;   KNEE ARTHROSCOPY     right   LAPAROSCOPIC LYSIS OF ADHESIONS  03/16/2015   Procedure: LAPAROSCOPIC LYSIS OF ADHESIONS;  Surgeon: Karie Soda,  MD;  [facility];  Service: General;;   MELANOMA EXCISION     stage 3+ lower left lumbar region with axillary node removal   MOHS SURGERY     mose surgery     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   PROSTATE BIOPSY  08/27/12   ROBOT ASSISTED LAPAROSCOPIC RADICAL PROSTATECTOMY N/A 11/04/2012   Procedure: ROBOTIC ASSISTED LAPAROSCOPIC RADICAL PROSTATECTOMY LEVEL 1;  Surgeon: Crecencio Mc, MD;  [facility];  Service: Urology;  Laterality: N/A;   ROTATOR CUFF REPAIR     right   ROTATOR CUFF REPAIR     left   SHOULDER ARTHROSCOPY     left     A IV  Location/Drains/Wounds Patient Lines/Drains/Airways Status     Active Line/Drains/Airways     Name Placement date Placement time Site Days   Peripheral IV 06/23/22 20 G Anterior;Distal;Right;Upper Antecubital 06/23/22  1507  Antecubital  1   Urethral Catheter Coude 18 Fr. 11/04/12  0921  Coude  3519   Ureteral Drain/Stent Left ureter 6 Fr. 02/18/16  1154  Left ureter  2318   Incision (Closed) 03/16/15 Abdomen Other (Comment) 03/16/15  1322  -- 2657   Incision (Closed) 02/18/16 Penis 02/18/16  1208  -- 2318   Incision - 3 Ports Abdomen Right Umbilicus Left 03/16/15  1316  -- 2657   Incision - 6 Ports Abdomen 1: Left;Lower;Lateral 2: Lateral;Upper 3: Superior;Umbilicus 4: Right;Lateral;Lower 5: Right;Medial;Upper 6: Right;Lateral;Upper 11/04/12  0756  -- 3519            Intake/Output Last 24 hours  Intake/Output Summary (Last 24 hours) at 06/24/2022 1133 Last data filed at 06/24/2022 1116 Gross per 24 hour  Intake --  Output 2450 ml  Net -2450 ml    Labs/Imaging Results for orders placed or performed during the hospital encounter of 06/23/22 (from the past 48 hour(s))  Basic metabolic panel     Status: Abnormal   Collection Time: 06/23/22 10:28 AM  Result Value Ref Range   Sodium 137 135 - 145 mmol/L   Potassium 4.1 3.5 - 5.1 mmol/L   Chloride 103 98 - 111 mmol/L   CO2 24 22 - 32 mmol/L   Glucose, Bld 129 (H) 70 - 99 mg/dL    Comment: Glucose reference range applies only to samples taken after fasting for at least 8 hours.   BUN 12 8 - 23 mg/dL   Creatinine, Ser 5.28 0.61 - 1.24 mg/dL   Calcium 8.9 8.9 - 41.3 mg/dL   GFR, Estimated >24 >40 mL/min    Comment: (NOTE) Calculated using the CKD-EPI Creatinine Equation (2021)    Anion gap 10 5 - 15    Comment: Performed at [facility], [address], [place], [place] [address]  CBC     Status: None   Collection Time: 06/23/22 10:28 AM  Result Value Ref Range   WBC 7.9 4.0 - 10.5 K/uL   RBC 4.52 4.22 - 5.81 MIL/uL    Hemoglobin 13.2 13.0 - 17.0 g/dL   HCT 53.6 64.4 - 03.4 %   MCV 88.5 80.0 - 100.0 fL   MCH 29.2 26.0 - 34.0 pg   MCHC 33.0 30.0 - 36.0 g/dL   RDW 74.2 59.5 - 63.8 %   Platelets 265 150 - 400 K/uL   nRBC 0.0 0.0 - 0.2 %    Comment: Performed at [facility], [address] [place], [place] [address]  Troponin I (High Sensitivity)  Status: None   Collection Time: 06/23/22 10:28 AM  Result Value Ref Range   Troponin I (High Sensitivity) 17 <18 ng/L    Comment: (NOTE) Elevated high sensitivity troponin I (hsTnI) values and significant  changes across serial measurements may suggest ACS but many other  chronic and acute conditions are known to elevate hsTnI results.  Refer to the "Links" section for chest pain algorithms and additional  guidance. Performed at [facility], [address] [place], [place] [address]   Brain natriuretic peptide     Status: Abnormal   Collection Time: 06/23/22 10:28 AM  Result Value Ref Range   B Natriuretic Peptide 1,408.5 (H) 0.0 - 100.0 pg/mL    Comment: Performed at [facility], [address], [place], [place] [address]  SARS Coronavirus 2 by RT PCR (hospital order, performed in [facility] hospital lab) *cepheid single result test* Anterior Nasal Swab     Status: None   Collection Time: 06/23/22 11:19 AM   Specimen: Anterior Nasal Swab  Result Value Ref Range   SARS Coronavirus 2 by RT PCR NEGATIVE NEGATIVE    Comment: Performed at [facility], [address] [place], [place] [address]  Troponin I (High Sensitivity)     Status: None   Collection Time: 06/23/22 12:22 PM  Result Value Ref Range   Troponin I (High Sensitivity) 16 <18 ng/L    Comment: (NOTE) Elevated high sensitivity troponin I (hsTnI) values and significant  changes across serial measurements may suggest ACS but many other  chronic and acute conditions are known to elevate hsTnI results.  Refer to the "Links" section for chest pain algorithms and  additional  guidance. Performed at [facility], [address] [place], [place] [address]   TSH     Status: None   Collection Time: 06/24/22  2:45 AM  Result Value Ref Range   TSH 2.937 0.350 - 4.500 uIU/mL    Comment: Performed by a 3rd Generation assay with a functional sensitivity of <=0.01 uIU/mL. Performed at [facility], [address], [place], [place] [address]   Basic metabolic panel     Status: Abnormal   Collection Time: 06/24/22  2:45 AM  Result Value Ref Range   Sodium 137 135 - 145 mmol/L   Potassium 3.7 3.5 - 5.1 mmol/L   Chloride 102 98 - 111 mmol/L   CO2 24 22 - 32 mmol/L   Glucose, Bld 103 (H) 70 - 99 mg/dL    Comment: Glucose reference range applies only to samples taken after fasting for at least 8 hours.   BUN 15 8 - 23 mg/dL   Creatinine, Ser 6.96 0.61 - 1.24 mg/dL   Calcium 9.0 8.9 - 29.5 mg/dL   GFR, Estimated >28 >41 mL/min    Comment: (NOTE) Calculated using the CKD-EPI Creatinine Equation (2021)    Anion gap 11 5 - 15    Comment: Performed at [facility], [address], [place], [place] [address]  Magnesium     Status: None   Collection Time: 06/24/22  9:56 AM  Result Value Ref Range   Magnesium 2.3 1.7 - 2.4 mg/dL    Comment: Performed at [facility], [address] [place], [place] [address]   DG Chest 1 View  Result Date: 06/24/2022 CLINICAL DATA:  CHF EXAM: CHEST  1 VIEW COMPARISON:  Yesterday FINDINGS: Bilateral airspace disease greater towards the left and mainly perihilar.  No Kerley lines, effusion, or pneumothorax. Normal heart size and mediastinal contours. IMPRESSION: Left more than right airspace disease with patchy appearance favoring pneumonia. Electronically Signed   By: Tiburcio Pea M.D.   On: 06/24/2022 05:48   DG Chest 2 View  Result Date: 06/23/2022 CLINICAL DATA:  Shortness of breath EXAM: CHEST - 2 VIEW COMPARISON:  10/27/2012 FINDINGS: Upper normal heart size. Mediastinal contours and pulmonary  vascularity normal. Patchy BILATERAL pulmonary infiltrates asymmetrically greater on LEFT consistent with multifocal pneumonia. No pleural effusion or pneumothorax. Osseous demineralization. IMPRESSION: Patchy BILATERAL pulmonary infiltrates greater on LEFT consistent with multifocal pneumonia. Electronically Signed   By: Ulyses Southward M.D.   On: 06/23/2022 11:00    Pending Labs Unresulted Labs (From admission, onward)     Start     Ordered   06/24/22 0910  Flecainide level  Add-on,   AD        06/24/22 0909   06/24/22 0500  Basic metabolic panel  Daily,   R     Comments: As Scheduled for 5 days    06/23/22 1519            Vitals/Pain Today's Vitals   06/24/22 0831 06/24/22 0831 06/24/22 0835 06/24/22 0836  BP:   121/83   Pulse:   87   Resp:   19   Temp:   97.7 F (36.5 C)   TempSrc:   Oral   SpO2:  96% 95% 95%  Weight:      Height:      PainSc: 0-No pain  0-No pain     Isolation Precautions No active isolations  Medications Medications  colchicine tablet 0.6 mg (has no administration in time range)  sacubitril-valsartan (ENTRESTO) 49-51 mg per tablet (1 tablet Oral Given 06/24/22 0934)  acidophilus (RISAQUAD) capsule 1 capsule (1 capsule Oral Given 06/24/22 1114)  diphenhydrAMINE (BENADRYL) 12.5 MG/5ML elixir 3.25 mg (has no administration in time range)  sodium chloride flush (NS) 0.9 % injection 3 mL (3 mLs Intravenous Given 06/24/22 0627)  sodium chloride flush (NS) 0.9 % injection 3 mL (has no administration in time range)  0.9 %  sodium chloride infusion (has no administration in time range)  acetaminophen (TYLENOL) tablet 650 mg (has no administration in time range)  ondansetron (ZOFRAN) injection 4 mg (has no administration in time range)  enoxaparin (LOVENOX) injection 40 mg (40 mg Subcutaneous Given 06/23/22 1535)  guaiFENesin (MUCINEX) 12 hr tablet 1,200 mg (1,200 mg Oral Given 06/24/22 0933)  furosemide (LASIX) injection 40 mg (40 mg Intravenous Given 06/23/22  1534)  potassium chloride SA (KLOR-CON M) CR tablet 40 mEq (40 mEq Oral Given 06/24/22 0934)  furosemide (LASIX) injection 60 mg (60 mg Intravenous Given 06/24/22 0934)    Mobility walks     Focused Assessments Cardiac Assessment Handoff:  Cardiac Rhythm: Other (Comment) (vent bigemeny) Lab Results  Component Value Date   TROPONINI <0.30 05/08/2011   Lab Results  Component Value Date   DDIMER 2.13 (H) 05/08/2011   Does the Patient currently have chest pain? No    R Recommendations: See Admitting Provider Note  Report given to:   Additional Notes: ambulated uses the urinal, been measuring his urine and gave him one time does lasix. Wife at bedside

## 2022-06-24 NOTE — Assessment & Plan Note (Addendum)
Echocardiogram pending, last study with mild reduction in LV systolic function 40 to 40%.  Today with improvement in volume status Urine output is documented at 2,375 ml. Systolic blood pressure is 125 to 101 mmHg.   Telemetry with occasional PAC and PVC (personally reviewed).   Plan to continue medical therapy with entresto. This morning had one dose of furosemide IV 60 mg.  Continue blood pressure monitoring.  Continue holding dofetilide.  Follow up on echocardiogram.

## 2022-06-24 NOTE — TOC Progression Note (Signed)
Transition of Care [facility]) - Progression Note    Patient Details  Name: Arthur Klein MRN: [id number] Date of Birth: 12-30-1945  Transition of Care [facility]) CM/SW Contact  Leone Haven, RN Phone Number: 06/24/2022, 4:14 PM  Clinical Narrative:    Patient is from home, presents with CHF, TOC followiing.        Expected Discharge Plan and Services                                               Social Determinants of Health (SDOH) Interventions SDOH Screenings   Food Insecurity: No Food Insecurity (06/24/2022)  Housing: Low Risk  (06/24/2022)  Transportation Needs: No Transportation Needs (06/24/2022)  Utilities: Not At Risk (06/24/2022)  Tobacco Use: High Risk (06/23/2022)    Readmission Risk Interventions     No data to display

## 2022-06-24 NOTE — Consult Note (Addendum)
ELECTROPHYSIOLOGY CONSULT NOTE    Patient ID: Arthur Klein MRN: [id number], DOB/AGE: 09/27/1945 77 y.o.  Admit date: 06/23/2022 Date of Consult: 06/24/2022  Primary Physician: Alysia Penna, MD Primary Cardiologist: Rollene Rotunda, MD  Electrophysiologist: Dr. Elberta Fortis   Referring Provider: Dr. Eden Emms  Patient Profile: Arthur Klein is a 77 y.o. male with a history of DM2, PVCs, and bradycardia who is being seen today for the evaluation of CHF at the request of Dr. Eden Emms.  HPI:  [name] additional history includes Stress 07/2021 with no ishemia EF low but on Echo it was better.    Echo 07/16/21 with EF 40-45%, global hypokinesis, LV internal mod dilated.  Moderate asymmetric LVH of basal septal segment. RV normal. LA mod dilated.  Mild to mod MR stable echo.    On remote CTA in 2013 there was calcification within the RCA and LAD   Last saw Dr. Elberta Fortis 06/20/22 burden of PVCs with flecainide at 4.9%. Pt noted worsening SOB, and there was some concern was 2/2 flecainide so this was held. . Pt with hx of chronic systolic HF. pro BNP on the 19th was 4,042.   Pt had continued SOB and orthopnea, and called office in light of BNP elevated. With on-going symptoms pt was encouraged to report to [facility].   Today, SOB has slightly improved with lasix. No longer coughing. Negative 1.4 L so far on IV lasix x 1 40 mg.   Labs Potassium3.7 (04/23 0245)   Creatinine, ser  0.98 (04/23 0245) PLT  265 (04/22 1028) HGB  13.2 (04/22 1028) WBC 7.9 (04/22 1028) Troponin I (High Sensitivity)16 (04/22 1222).    Past Medical History:  Diagnosis Date   Arthritis    Complication of anesthesia    doesn't wake up well,combative until wife talks with him   Diabetes mellitus without complication    diet controlled    Esophageal reflux    Gout    hx of   H/O hiatal hernia    History of kidney stones    Hypertension    borderline HTNt   Plantar fasciitis    no problems now   PONV (postoperative  nausea and vomiting)    Prostate cancer 08/27/2012   gleason 6, volume 59.3 cc   PVC (premature ventricular contraction)    hx of    Skin cancer 2011   melanoma on back, resected    Undescended left testes      Surgical History:  Past Surgical History:  Procedure Laterality Date   CYSTOSCOPY WITH RETROGRADE PYELOGRAM, URETEROSCOPY AND STENT PLACEMENT Left 02/18/2016   Procedure: CYSTOSCOPY WITH LEFT RETROGRADE PYELOGRAM, URETEROSCOPY  AND URETERAL  STENT PLACEMENT;  Surgeon: Heloise Purpura, MD;  [facility];  Service: Urology;  Laterality: Left;   HERNIA REPAIR     INGUINAL HERNIA REPAIR Bilateral 03/16/2015   Procedure: LAPAROSCOPIC REPAIR OF BILATERAL FEMEROL AND  BILATERAL INGUINAL HERNIAS WITH MESH;  Surgeon: Karie Soda, MD;  [facility];  Service: General;  Laterality: Bilateral;   INSERTION OF MESH Right 03/16/2015   Procedure: INSERTION OF MESH;  Surgeon: Karie Soda, MD;  [facility];  Service: General;  Laterality: Right;   KNEE ARTHROPLASTY  05/02/2011   Procedure: COMPUTER ASSISTED TOTAL KNEE ARTHROPLASTY;  Surgeon: Harvie Junior, MD;  Location: [facility];  Service: Orthopedics;  Laterality: Right;   KNEE ARTHROSCOPY     right   LAPAROSCOPIC LYSIS OF ADHESIONS  03/16/2015   Procedure: LAPAROSCOPIC LYSIS OF ADHESIONS;  Surgeon:  Karie Soda, MD;  [facility];  Service: General;;   MELANOMA EXCISION     stage 3+ lower left lumbar region with axillary node removal   MOHS SURGERY     mose surgery     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   PROSTATE BIOPSY  08/27/12   ROBOT ASSISTED LAPAROSCOPIC RADICAL PROSTATECTOMY N/A 11/04/2012   Procedure: ROBOTIC ASSISTED LAPAROSCOPIC RADICAL PROSTATECTOMY LEVEL 1;  Surgeon: Crecencio Mc, MD;  [facility];  Service: Urology;  Laterality: N/A;   ROTATOR CUFF REPAIR     right   ROTATOR CUFF REPAIR     left   SHOULDER ARTHROSCOPY     left     (Not in a hospital admission)   Inpatient Medications:   acidophilus   1 capsule Oral Daily   enoxaparin (LOVENOX) injection  40 mg Subcutaneous Q24H   guaiFENesin  1,200 mg Oral BID   sacubitril-valsartan  1 tablet Oral BID   sodium chloride flush  3 mL Intravenous Q12H    Allergies:  Allergies  Allergen Reactions   Erythrocin Diarrhea   Erythromycin Diarrhea   Lisinopril Cough   Nsaids Other (See Comments)    Gi bleeding Has tolerated limited, small amounts of Meloxicam   Oxycodone Other (See Comments)    Became a "wild person" and had total amnesia    Family History  Problem Relation Age of Onset   Congestive Heart Failure Mother    Diabetes Mother    Hypertension Mother    Diabetes Father    Hypertension Father    Stroke Father    Cancer Father        prostate, pancreas   Cancer Brother 43       prostate- xrt, seed implant   Anesthesia problems Neg Hx    Hypotension Neg Hx    Malignant hyperthermia Neg Hx    Pseudochol deficiency Neg Hx      Physical Exam: Vitals:   06/24/22 0630 06/24/22 0831 06/24/22 0835 06/24/22 0836  BP: (!) 122/98  121/83   Pulse: 79  87   Resp: 11  19   Temp: 98.4 F (36.9 C)  97.7 F (36.5 C)   TempSrc: Oral  Oral   SpO2: 97% 96% 95% 95%  Weight:      Height:        GEN- NAD, A&O x 3, normal affect HEENT: Normocephalic, atraumatic Lungs- CTAB, Normal effort.  Heart- Regular rate and rhythm, No M/G/R.  GI- Soft, NT, ND.  Extremities- No clubbing, cyanosis, or edema   Radiology/Studies: DG Chest 1 View  Result Date: 06/24/2022 CLINICAL DATA:  CHF EXAM: CHEST  1 VIEW COMPARISON:  Yesterday FINDINGS: Bilateral airspace disease greater towards the left and mainly perihilar. No Kerley lines, effusion, or pneumothorax. Normal heart size and mediastinal contours. IMPRESSION: Left more than right airspace disease with patchy appearance favoring pneumonia. Electronically Signed   By: Tiburcio Pea M.D.   On: 06/24/2022 05:48   DG Chest 2 View  Result Date: 06/23/2022 CLINICAL DATA:  Shortness of  breath EXAM: CHEST - 2 VIEW COMPARISON:  10/27/2012 FINDINGS: Upper normal heart size. Mediastinal contours and pulmonary vascularity normal. Patchy BILATERAL pulmonary infiltrates asymmetrically greater on LEFT consistent with multifocal pneumonia. No pleural effusion or pneumothorax. Osseous demineralization. IMPRESSION: Patchy BILATERAL pulmonary infiltrates greater on LEFT consistent with multifocal pneumonia. Electronically Signed   By: Ulyses Southward M.D.   On: 06/23/2022 11:00    EKG: on arrival showed NSR  at 1 with frequent PACs (personally reviewed)  TELEMETRY: NSR 70-80s with occasional PVCs and PACs (personally reviewed)  Assessment/Plan:  Acute on chronic HFrEF (40-45%) EF 40-45% 07/2021 and moderate asymmetric LVH noted, not previously noted.  Update echo to clarify EF and LVH. May need cMRI to further assess. BNP 1408 on arrival.  1.4 L out with IV lasix. Will re-dose at 60 mg this am and follow response.  Continue Entresto.  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 Graciela Husbands discuss with Pulmonary and could consider High Res CT to further clarify, but would recommend adequate diuresis first.   For questions or updates, please contact [facility] Please consult [email or web address] for contact info under Cardiology/STEMI.  Signed, Graciella Freer, PA-C  06/24/2022 9:50 AM  Dyspnea on exertion  Congestive heart failure acute/chronic HFmrEF  PVCs-frequent  Flecainide for the above  Aortic stenosis mild-moderate  Asymmetric septal hypertrophy  The patient presents with an elevated BNP dyspnea on exertion with some improvement with both the discontinuation of the flecainide as well and has vigorous diuresis. Other potential contributing factors include aortic stenosis so echocardiogram will be important, LV function.   Lateral also inform guideline directed therapeutic recommendations.  There is a literature-infrequent albeit, flecainide induced respiratory insufficiency with interstitial fibrosis.  I reached out to pulmonary's recommendation was to proceed with diuresis and then consider high-resolution CT thereafter depending on the symptoms.  It may will be important because flecainide, in the event that his ejection fraction is improved, would be a reasonable choice for PVC suppression  His echocardiogram also demonstrated asymmetric septal hypertrophy.  cMRI may be necessary to try to clarify whether this is real or not, not so much for the patient sake as for the family history.  Reviewed sensibly with the family.  Continue vigorous diuresis and await echocardiogram

## 2022-06-24 NOTE — Hospital Course (Signed)
Arthur Klein was admitted to the hospital with the working diagnosis of heart failure decompensation.   77 yo male with the past medical history of heart failure, hypertension, ventricular ectopy treated with flecainide and gout who presented with dyspnea. Reported 2 to 3 weeks, of dyspnea on exertion and cough. 3 days prior to hospitalization he was evaluated as outpatient by cardiology and recommended to to stop flecainide. At home his symptoms continue to worsened with progressive dyspnea, lower extremity edema, PND and orthopnea. Positive chest pressure. I called cardiology office and he was advised to come to the ED. On his initial physical examination his blood pressure was 120/81, HR 90, RR 18 and 02 saturation 93%, lungs with rales bilaterally, with increased work of breathing, heart with S1 and S2 present and rhythmic with no gallops, rubs or murmurs, abdomen with no distention, and no lower extremity edema.   Na 137, K 4,1 Cl 103 bicarbonate 24 glucose 129, bun 12 cr 1,0 BNP 1,408  High sensitive troponin 17  Wbc 7,9 hgb 13,2 plt 265  Sars covid 19 negative  Chest radiograph with bilateral hilar vascular congestion, bilateral interstitial infiltrates, predominant at lower lobes, small right pleural effusion.  EKG 93 bpm, normal axis, normal intervals, sinus rhythm with multiple PAC, with no significant ST segment or T wave changes.   Patient was placed on furosemide for diuresis.

## 2022-06-24 NOTE — Progress Notes (Signed)
Echocardiogram report reviewed. 3 interesting findings 1-no aortic stenosis 2-LV dimensions but without evidence of hypertrophic cardiomyopathy 3-LVEF 20-25%  Would consider catheterization at this juncture with known coronary artery disease will make n.p.o.

## 2022-06-24 NOTE — Progress Notes (Signed)
Progress Note  Patient Name: Arthur Klein Date of Encounter: 06/24/2022  Primary Cardiologist: Rollene Rotunda, MD   Subjective   Patient seen examined by his bedside.  His wife was in the room during my encounter with his bedside nurse.  He tells me shortness of breath is slightly improving.  Coughing has resolved.  Inpatient Medications    Scheduled Meds:  acidophilus  1 capsule Oral Daily   enoxaparin (LOVENOX) injection  40 mg Subcutaneous Q24H   furosemide  60 mg Intravenous Once   guaiFENesin  1,200 mg Oral BID   potassium chloride  40 mEq Oral Once   sacubitril-valsartan  1 tablet Oral BID   sodium chloride flush  3 mL Intravenous Q12H   Continuous Infusions:  sodium chloride     PRN Meds: sodium chloride, acetaminophen, colchicine, diphenhydrAMINE, ondansetron (ZOFRAN) IV, sodium chloride flush   Vital Signs    Vitals:   06/24/22 0630 06/24/22 0831 06/24/22 0835 06/24/22 0836  BP: (!) 122/98  121/83   Pulse: 79  87   Resp: 11  19   Temp: 98.4 F (36.9 C)  97.7 F (36.5 C)   TempSrc: Oral  Oral   SpO2: 97% 96% 95% 95%  Weight:      Height:        Intake/Output Summary (Last 24 hours) at 06/24/2022 0904 Last data filed at 06/24/2022 0840 Gross per 24 hour  Intake --  Output 1450 ml  Net -1450 ml   Filed Weights   06/23/22 1019  Weight: 92.1 kg    Telemetry    Sinus rhythm- Personally Reviewed  ECG    None today- Personally Reviewed  Physical Exam    General: Comfortable Head: Atraumatic, normal size  Eyes: PEERLA, EOMI  Neck: Supple, normal JVD Cardiac: Normal S1, S2; RRR; no murmurs, rubs, or gallops Lungs: Clear to auscultation bilaterally Abd: Soft, nontender, no hepatomegaly  Ext: warm, no edema Musculoskeletal: No deformities, BUE and BLE strength normal and equal Skin: Warm and dry, no rashes   Neuro: Alert and oriented to person, place, time, and situation, CNII-XII grossly intact, no focal deficits  Psych: Normal mood and  affect   Labs    Chemistry Recent Labs  Lab 06/23/22 1028 06/24/22 0245  NA 137 137  K 4.1 3.7  CL 103 102  CO2 24 24  GLUCOSE 129* 103*  BUN 12 15  CREATININE 1.03 0.98  CALCIUM 8.9 9.0  GFRNONAA >60 >60  ANIONGAP 10 11     Hematology Recent Labs  Lab 06/23/22 1028  WBC 7.9  RBC 4.52  HGB 13.2  HCT 40.0  MCV 88.5  MCH 29.2  MCHC 33.0  RDW 13.3  PLT 265    Cardiac EnzymesNo results for input(s): "TROPONINI" in the last 168 hours. No results for input(s): "TROPIPOC" in the last 168 hours.   BNP Recent Labs  Lab 06/20/22 1130 06/23/22 1028  BNP  --  1,408.5*  PROBNP 4,042*  --      DDimer No results for input(s): "DDIMER" in the last 168 hours.   Radiology    DG Chest 1 View  Result Date: 06/24/2022 CLINICAL DATA:  CHF EXAM: CHEST  1 VIEW COMPARISON:  Yesterday FINDINGS: Bilateral airspace disease greater towards the left and mainly perihilar. No Kerley lines, effusion, or pneumothorax. Normal heart size and mediastinal contours. IMPRESSION: Left more than right airspace disease with patchy appearance favoring pneumonia. Electronically Signed   By: Audry Riles.D.  On: 06/24/2022 05:48   DG Chest 2 View  Result Date: 06/23/2022 CLINICAL DATA:  Shortness of breath EXAM: CHEST - 2 VIEW COMPARISON:  10/27/2012 FINDINGS: Upper normal heart size. Mediastinal contours and pulmonary vascularity normal. Patchy BILATERAL pulmonary infiltrates asymmetrically greater on LEFT consistent with multifocal pneumonia. No pleural effusion or pneumothorax. Osseous demineralization. IMPRESSION: Patchy BILATERAL pulmonary infiltrates greater on LEFT consistent with multifocal pneumonia. Electronically Signed   By: Ulyses Southward M.D.   On: 06/23/2022 11:00    Cardiac Studies   Transthoracic  echocardiogram May 2023 Echo 07/16/21 IMPRESSIONS     1. Left ventricular ejection fraction, by estimation, is 40 to 45%. Left  ventricular ejection fraction by PLAX is 41 %. The  left ventricle has  mildly decreased function. The left ventricle demonstrates global  hypokinesis. The left ventricular internal  cavity size was moderately dilated. There is moderate asymmetric left  ventricular hypertrophy of the basal-septal segment. Left ventricular  diastolic function could not be evaluated.   2. Right ventricular systolic function is normal. The right ventricular  size is normal. Tricuspid regurgitation signal is inadequate for assessing  PA pressure.   3. Left atrial size was moderately dilated.   4. The mitral valve is abnormal. Mild to moderate mitral valve  regurgitation.   5. The aortic valve is tricuspid. Aortic valve regurgitation is not  visualized. Mild aortic valve stenosis. Aortic valve mean gradient  measures 16.5 mmHg. Aortic valve Vmax measures 2.76 m/s.   6. The inferior vena cava is normal in size with <50% respiratory  variability, suggesting right atrial pressure of 8 mmHg.   Comparison(s): No significant change from prior study. 02/27/2021: LVEF  40-45%.   FINDINGS   Left Ventricle: Left ventricular ejection fraction, by estimation, is 40  to 45%. Left ventricular ejection fraction by PLAX is 41 %. The left  ventricle has mildly decreased function. The left ventricle demonstrates  global hypokinesis. The left  ventricular internal cavity size was moderately dilated. There is moderate  asymmetric left ventricular hypertrophy of the basal-septal segment. Left  ventricular diastolic function could not be evaluated due to indeterminate  diastolic function. Left  ventricular diastolic function could not be evaluated.   Right Ventricle: The right ventricular size is normal. No increase in  right ventricular wall thickness. Right ventricular systolic function is  normal. Tricuspid regurgitation signal is inadequate for assessing PA  pressure.   Left Atrium: Left atrial size was moderately dilated.   Right Atrium: Right atrial size was normal  in size.   Pericardium: There is no evidence of pericardial effusion.   Mitral Valve: The mitral valve is abnormal. There is mild thickening of  the anterior and posterior mitral valve leaflet(s). Mild to moderate  mitral valve regurgitation.   Tricuspid Valve: The tricuspid valve is grossly normal. Tricuspid valve  regurgitation is trivial.   Aortic Valve: The aortic valve is tricuspid. Aortic valve regurgitation is  not visualized. Mild aortic stenosis is present. Aortic valve mean  gradient measures 16.5 mmHg. Aortic valve peak gradient measures 30.4  mmHg.   Pulmonic Valve: The pulmonic valve was normal in structure. Pulmonic valve  regurgitation is not visualized.   Aorta: The aortic root and ascending aorta are structurally normal, with  no evidence of dilitation.   Venous: The inferior vena cava is normal in size with less than 50%  respiratory variability, suggesting right atrial pressure of 8 mmHg.   IAS/Shunts: No atrial level shunt detected by color flow Doppler.  Patient Profile     77 y.o. male with history of diabetes type 2, PVCs, mildly depressed ejection fraction and CAD presenting due to shortness of breath.  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  Will monitor his output kidney function.   In terms of his antiarrhythmic for frequent PVCs his flecainide has been stopped given his depressed EF will discuss with EP for any further recommendation consideration for other antiarrhythmics. No anginal symptoms at this time no need to pursue any ischemic evaluation.  He will benefit from a repeat echocardiogram. Repeat echo pending.      For questions or updates, please contact [facility] Please consult [email or web address] for contact info under Cardiology/STEMI.       Signed, Thomasene Ripple, DO  06/24/2022, 9:04 AM

## 2022-06-24 NOTE — Progress Notes (Incomplete)
Patient Name: Arthur Klein Date of Encounter: 06/25/22  Primary Cardiologist: Rollene Rotunda, MD Electrophysiologist: Regan Lemming, MD  Interval Summary   Echo with EF 20-25%, without HCM, and no AS.   Feeling Ok this am. Better with diuresis.   Inpatient Medications    Scheduled Meds:  acidophilus  1 capsule Oral Daily   [START ON 06/26/2022] aspirin  81 mg Oral Pre-Cath   enoxaparin (LOVENOX) injection  40 mg Subcutaneous Q24H   guaiFENesin  1,200 mg Oral BID   sacubitril-valsartan  1 tablet Oral BID   sodium chloride flush  3 mL Intravenous Q12H   sodium chloride flush  3 mL Intravenous Q12H   Continuous Infusions:  sodium chloride     sodium chloride     [START ON 06/26/2022] sodium chloride     PRN Meds: sodium chloride, sodium chloride, acetaminophen, colchicine, diphenhydrAMINE, ondansetron (ZOFRAN) IV, sodium chloride flush, sodium chloride flush   Vital Signs    Vitals:   06/24/22 1330 06/24/22 1925 06/25/22 0028 06/25/22 0606  BP: 101/67 113/80 110/72 109/85  Pulse: 98 (!) 101 94 90  Resp:  Temp:  97.9 F (36.6 C) 98.2 F (36.8 C) 98 F (36.7 C)  TempSrc:  Oral Oral Oral  SpO2: 95% 95% 95% 94%  Weight: 88 kg   87.1 kg  Height:  (1.753 m)       Intake/Output Summary (Last 24 hours) at 06/25/2022 0801 Last data filed at 06/24/2022 1900 Gross per 24 hour  Intake --  Output 2975 ml  Net -2975 ml   Filed Weights   06/23/22 1019 06/24/22 1330 06/25/22 0606  Weight: 92.1 kg 88 kg 87.1 kg    Physical Exam    GEN- The patient is well appearing, alert and oriented x 3 today.   Lungs- Clear to ausculation bilaterally, normal work of breathing Cardiac- Regular rate and rhythm with occasional ectopy, no murmurs, rubs or gallops GI- soft, NT, ND, + BS Extremities- no clubbing or cyanosis. No edema  Telemetry    NSR 70-90s with occasional PVCs (personally reviewed)  Hospital Course    Holdan Bartoszek is a 77 y.o. male with a  history of DM2, PVCs, and bradycardia who is being seen today for the evaluation of CHF at the request of Dr. Eden Emms.   Pt admitted with worsening SOB and + BNP.  Assessment & Plan    Acute systolic CHF  EF 82-95% 07/2021 and moderate asymmetric LVH noted, not previously noted.  EF this admission down to 20-25%, no LVH or AS noted Brisk diuresis noted.  Continue Entresto.  Has previously not tolerated BB due to bradycardia Consider SGLT2i  Consider spiro Given new drop in EF, plan for [facility] this am Consider additional dose of lasix this afternoon pending cath numbers.   Frequent PVCs As high as 28%, more recent monitor 4.9% (12/2021) Flecainide now stopped with CHF   DOE Dr. Graciela Husbands discuss with Pulmonary and could consider High Res CT to further clarify, but would recommend adequate diuresis first.  May be able to defer this now pending cath results and more clear picture of his SOB.   For questions or updates, please contact [facility] Please consult [email or web address] for contact info under Cardiology/STEMI.  Signed, Graciella Freer, PA-C  06/25/2022, 8:01 AM   I have seen and examined this patient with [name].  Agree with above, note added to reflect my findings.  Patient feeling improved.  Net -3.6 L  since admission.  Ejection fraction on most recent echo 25%.  GEN: Well nourished, well developed, in no acute distress  HEENT: normal  Neck: no JVD, carotid bruits, or masses Cardiac: RRR; no murmurs, rubs, or gallops,no edema  Respiratory:  clear to auscultation bilaterally, normal work of breathing GI: soft, nontender, nondistended, + BS MS: no deformity or atrophy  Skin: warm and dry Neuro:  Strength and sensation are intact Psych: euthymic mood, full affect   Acute on chronic systolic heart failure: Ejection fraction 20 to 25%.  Patricia Perales continue heart failure medications per primary cardiology.  Dantre Yearwood plan for right and left heart catheterization today. PVCs: Had  previously elevated burden.  Lower burden currently.  If PVCs continue throughout hospitalization, Creed Kail need amiodarone. The patient understands that risks include but are not limited to stroke (1 in 1000), death (1 in 1000), kidney failure [usually temporary] (1 in 500), bleeding (1 in 200), allergic reaction [possibly serious] (1 in 200), and agrees to proceed.   Kambri Dismore M. Delorus Langwell MD 06/25/2022 8:10 AM

## 2022-06-24 NOTE — H&P (View-Only) (Signed)
Patient Name: Arthur Klein Date of Encounter: 06/25/22  Primary Cardiologist: Rollene Rotunda, MD Electrophysiologist: Regan Lemming, MD  Interval Summary   Echo with EF 20-25%, without HCM, and no AS.   Feeling Ok this am. Better with diuresis.   Inpatient Medications    Scheduled Meds:  acidophilus  1 capsule Oral Daily   [START ON 06/26/2022] aspirin  81 mg Oral Pre-Cath   enoxaparin (LOVENOX) injection  40 mg Subcutaneous Q24H   guaiFENesin  1,200 mg Oral BID   sacubitril-valsartan  1 tablet Oral BID   sodium chloride flush  3 mL Intravenous Q12H   sodium chloride flush  3 mL Intravenous Q12H   Continuous Infusions:  sodium chloride     sodium chloride     [START ON 06/26/2022] sodium chloride     PRN Meds: sodium chloride, sodium chloride, acetaminophen, colchicine, diphenhydrAMINE, ondansetron (ZOFRAN) IV, sodium chloride flush, sodium chloride flush   Vital Signs    Vitals:   06/24/22 1330 06/24/22 1925 06/25/22 0028 06/25/22 0606  BP: 101/67 113/80 110/72 109/85  Pulse: 98 (!) 101 94 90  Resp:  Temp:  97.9 F (36.6 C) 98.2 F (36.8 C) 98 F (36.7 C)  TempSrc:  Oral Oral Oral  SpO2: 95% 95% 95% 94%  Weight: 88 kg   87.1 kg  Height:  (1.753 m)       Intake/Output Summary (Last 24 hours) at 06/25/2022 0801 Last data filed at 06/24/2022 1900 Gross per 24 hour  Intake --  Output 2975 ml  Net -2975 ml   Filed Weights   06/23/22 1019 06/24/22 1330 06/25/22 0606  Weight: 92.1 kg 88 kg 87.1 kg    Physical Exam    GEN- The patient is well appearing, alert and oriented x 3 today.   Lungs- Clear to ausculation bilaterally, normal work of breathing Cardiac- Regular rate and rhythm with occasional ectopy, no murmurs, rubs or gallops GI- soft, NT, ND, + BS Extremities- no clubbing or cyanosis. No edema  Telemetry    NSR 70-90s with occasional PVCs (personally reviewed)  Hospital Course    Holdan Bartoszek is a 77 y.o. male with a  history of DM2, PVCs, and bradycardia who is being seen today for the evaluation of CHF at the request of Dr. Eden Emms.   Pt admitted with worsening SOB and + BNP.  Assessment & Plan    Acute systolic CHF  EF 82-95% 07/2021 and moderate asymmetric LVH noted, not previously noted.  EF this admission down to 20-25%, no LVH or AS noted Brisk diuresis noted.  Continue Entresto.  Has previously not tolerated BB due to bradycardia Consider SGLT2i  Consider spiro Given new drop in EF, plan for [facility] this am Consider additional dose of lasix this afternoon pending cath numbers.   Frequent PVCs As high as 28%, more recent monitor 4.9% (12/2021) Flecainide now stopped with CHF   DOE Dr. Graciela Husbands discuss with Pulmonary and could consider High Res CT to further clarify, but would recommend adequate diuresis first.  May be able to defer this now pending cath results and more clear picture of his SOB.   For questions or updates, please contact [facility] Please consult [email or web address] for contact info under Cardiology/STEMI.  Signed, Graciella Freer, PA-C  06/25/2022, 8:01 AM   I have seen and examined this patient with [name].  Agree with above, note added to reflect my findings.  Patient feeling improved.  Net -3.6 L  since admission.  Ejection fraction on most recent echo 25%.  GEN: Well nourished, well developed, in no acute distress  HEENT: normal  Neck: no JVD, carotid bruits, or masses Cardiac: RRR; no murmurs, rubs, or gallops,no edema  Respiratory:  clear to auscultation bilaterally, normal work of breathing GI: soft, nontender, nondistended, + BS MS: no deformity or atrophy  Skin: warm and dry Neuro:  Strength and sensation are intact Psych: euthymic mood, full affect   Acute on chronic systolic heart failure: Ejection fraction 20 to 25%.  Shaylon Aden continue heart failure medications per primary cardiology.  Thu Baggett plan for right and left heart catheterization today. PVCs: Had  previously elevated burden.  Lower burden currently.  If PVCs continue throughout hospitalization, Aleysha Meckler need amiodarone. The patient understands that risks include but are not limited to stroke (1 in 1000), death (1 in 1000), kidney failure [usually temporary] (1 in 500), bleeding (1 in 200), allergic reaction [possibly serious] (1 in 200), and agrees to proceed.   Brayli Klingbeil M. Anzal Bartnick MD 06/25/2022 8:10 AM  

## 2022-06-24 NOTE — Assessment & Plan Note (Signed)
No acute attack, continue with allopurinol and colchicine.

## 2022-06-24 NOTE — Progress Notes (Signed)
PROGRESS NOTE    Arthur Klein  ZOX:[id number] DOB: 01-Nov-1945 DOA: 06/23/2022 PCP: Alysia Penna, MD   76/M wy.o. 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 -history of frequent PVCs has been on flecainide about 7-8 months ago.  Last 2-3 weeks patient has been having increasing exertional dyspnea and cough, dry, denies any fever chills.  He was seen in cardiology clinic 3 days ago, advised to discontinue flecainide, despite this continues to have worsening dyspnea, progressed to orthopnea.  Morning of admission had chest pressure as well.  Subsequently presented to the ED, workup in the ED noted creatinine of 1.0, WBC 7.9, chest x-ray with cardiomegaly bilateral pulmonary vascular congestion versus infiltrates, multiple PVCs    Subjective: -Feels better, urinated yesterday, breathing is improving  Assessment and Plan:   Acute on chronic systolic CHF -Last echo 5/23 with EF of 40-45% moderate LVH, normal RV -Now echo with a EF down to 20-25%, grade 2 diastolic dysfunction -3.6 L negative yesterday, holding further IV Lasix today -Continue Entresto, add Jardiance -Cards following, plan for right and left heart cath today -TOC consult  Frequent PVCs -Recently taken off flecainide, EP consulting  Hyperglycemia -Check HbA1c  History of gout -On colchicine PRN   DVT prophylaxis: lovenox Code Status: Full Code Family Communication: Wife at bedside Disposition Plan: Pending cardiac workup  Consultants: Cards, EP   Procedures:   Antimicrobials:    Objective: Vitals:   06/24/22 0835 06/24/22 0836 06/24/22 1311 06/24/22 1330  BP: 121/83   101/67  Pulse: 87   98  Resp: 19     Temp: 97.7 F (36.5 C)  (!) 97.3 F (36.3 C)   TempSrc: Oral  Oral   SpO2: 95% 95%  95%  Weight:      Height:        Intake/Output Summary (Last 24 hours) at 06/24/2022 1358 Last  data filed at 06/24/2022 1310 Gross per 24 hour  Intake --  Output 3025 ml  Net -3025 ml   Filed Weights   06/23/22 1019  Weight: 92.1 kg    Examination:      Data Reviewed:   CBC: Recent Labs  Lab 06/23/22 1028  WBC 7.9  HGB 13.2  HCT 40.0  MCV 88.5  PLT 265   Basic Metabolic Panel: Recent Labs  Lab 06/23/22 1028 06/24/22 0245 06/24/22 0956  NA 137 137  --   K 4.1 3.7  --   CL 103 102  --   CO2 24 24  --   GLUCOSE 129* 103*  --   BUN 12 15  --   CREATININE 1.03 0.98  --   CALCIUM 8.9 9.0  --   MG  --   --  2.3   GFR: Estimated Creatinine Clearance: 72.9 mL/min (by C-G formula based on SCr of 0.98 mg/dL). Liver Function Tests: No results for input(s): "AST", "ALT", "ALKPHOS", "BILITOT", "PROT", "ALBUMIN" in the last 168 hours. No results for input(s): "LIPASE", "AMYLASE" in the last 168 hours. No results for input(s): "AMMONIA" in the last 168 hours. Coagulation Profile: No results for input(s): "INR", "PROTIME" in the last 168 hours. Cardiac Enzymes: No results for input(s): "CKTOTAL", "CKMB", "CKMBINDEX", "TROPONINI" in the last 168 hours. BNP (last 3 results) Recent Labs    06/20/22 1130  PROBNP 4,042*   HbA1C: No results for input(s): "HGBA1C" in the last 72 hours. CBG: No results for input(s): "  GLUCAP" in the last 168 hours. Lipid Profile: No results for input(s): "CHOL", "HDL", "LDLCALC", "TRIG", "CHOLHDL", "LDLDIRECT" in the last 72 hours. Thyroid Function Tests: Recent Labs    06/24/22 0245  TSH 2.937   Anemia Panel: No results for input(s): "VITAMINB12", "FOLATE", "FERRITIN", "TIBC", "IRON", "RETICCTPCT" in the last 72 hours. Urine analysis:    Component Value Date/Time   COLORURINE YELLOW 05/08/2011 1632   APPEARANCEUR CLEAR 05/08/2011 1632   LABSPEC 1.017 05/08/2011 1632   PHURINE 6.0 05/08/2011 1632   GLUCOSEU 100 (A) 05/08/2011 1632   HGBUR NEGATIVE 05/08/2011 1632   BILIRUBINUR NEGATIVE 05/08/2011 1632   KETONESUR  NEGATIVE 05/08/2011 1632   PROTEINUR NEGATIVE 05/08/2011 1632   UROBILINOGEN 1.0 05/08/2011 1632   NITRITE NEGATIVE 05/08/2011 1632   LEUKOCYTESUR NEGATIVE 05/08/2011 1632   Sepsis Labs: (procalcitonin:4,lacticidven:4)  ) Recent Results (from the past 240 hour(s))  SARS Coronavirus 2 by RT PCR (hospital order, performed in [facility] hospital lab) *cepheid single result test* Anterior Nasal Swab     Status: None   Collection Time: 06/23/22 11:19 AM   Specimen: Anterior Nasal Swab  Result Value Ref Range Status   SARS Coronavirus 2 by RT PCR NEGATIVE NEGATIVE Final    Comment: Performed at [facility], [address] [place], [place] [address]     Radiology Studies: DG Chest 1 View  Result Date: 06/24/2022 CLINICAL DATA:  CHF EXAM: CHEST  1 VIEW COMPARISON:  Yesterday FINDINGS: Bilateral airspace disease greater towards the left and mainly perihilar. No Kerley lines, effusion, or pneumothorax. Normal heart size and mediastinal contours. IMPRESSION: Left more than right airspace disease with patchy appearance favoring pneumonia. Electronically Signed   By: Tiburcio Pea M.D.   On: 06/24/2022 05:48   DG Chest 2 View  Result Date: 06/23/2022 CLINICAL DATA:  Shortness of breath EXAM: CHEST - 2 VIEW COMPARISON:  10/27/2012 FINDINGS: Upper normal heart size. Mediastinal contours and pulmonary vascularity normal. Patchy BILATERAL pulmonary infiltrates asymmetrically greater on LEFT consistent with multifocal pneumonia. No pleural effusion or pneumothorax. Osseous demineralization. IMPRESSION: Patchy BILATERAL pulmonary infiltrates greater on LEFT consistent with multifocal pneumonia. Electronically Signed   By: Ulyses Southward M.D.   On: 06/23/2022 11:00     Scheduled Meds:  acidophilus  1 capsule Oral Daily   enoxaparin (LOVENOX) injection  40 mg Subcutaneous Q24H   guaiFENesin  1,200 mg Oral BID   sacubitril-valsartan  1 tablet Oral BID   sodium chloride flush  3 mL  Intravenous Q12H   Continuous Infusions:  sodium chloride       LOS: 0 days    Time spent:    Zannie Cove, MD [facility]   06/24/2022, 1:58 PM

## 2022-06-25 ENCOUNTER — Other Ambulatory Visit (HOSPITAL_COMMUNITY): Payer: Self-pay

## 2022-06-25 ENCOUNTER — Encounter (HOSPITAL_COMMUNITY)
Admission: EM | Disposition: A | Discharge status: Home / Self Care | Payer: Self-pay | Source: Home / Self Care | Attending: Internal Medicine

## 2022-06-25 DIAGNOSIS — I2510 Atherosclerotic heart disease of native coronary artery without angina pectoris: Secondary | ICD-10-CM

## 2022-06-25 HISTORY — PX: RIGHT/LEFT HEART CATH AND CORONARY ANGIOGRAPHY: CATH118266

## 2022-06-25 LAB — POCT I-STAT EG7
Acid-Base Excess: 1 mmol/L (ref 0.0–2.0)
Acid-Base Excess: 1 mmol/L (ref 0.0–2.0)
Bicarbonate: 27.1 mmol/L (ref 20.0–28.0)
Bicarbonate: 27.3 mmol/L (ref 20.0–28.0)
Calcium, Ion: 1.22 mmol/L (ref 1.15–1.40)
Calcium, Ion: 1.22 mmol/L (ref 1.15–1.40)
HCT: 40 % (ref 39.0–52.0)
HCT: 41 % (ref 39.0–52.0)
Hemoglobin: 13.6 g/dL (ref 13.0–17.0)
Hemoglobin: 13.9 g/dL (ref 13.0–17.0)
O2 Saturation: 64 %
O2 Saturation: 65 %
Potassium: 4.5 mmol/L (ref 3.5–5.1)
Potassium: 4.7 mmol/L (ref 3.5–5.1)
Sodium: 138 mmol/L (ref 135–145)
Sodium: 138 mmol/L (ref 135–145)
TCO2: 29 mmol/L (ref 22–32)
TCO2: 29 mmol/L (ref 22–32)
pCO2, Ven: 47 mmHg (ref 44–60)
pCO2, Ven: 48.1 mmHg (ref 44–60)
pH, Ven: 7.362 (ref 7.25–7.43)
pH, Ven: 7.369 (ref 7.25–7.43)
pO2, Ven: 35 mmHg (ref 32–45)
pO2, Ven: 35 mmHg (ref 32–45)

## 2022-06-25 LAB — CBC
HCT: 40.4 % (ref 39.0–52.0)
Hemoglobin: 13.6 g/dL (ref 13.0–17.0)
MCH: 29.1 pg (ref 26.0–34.0)
MCHC: 33.7 g/dL (ref 30.0–36.0)
MCV: 86.5 fL (ref 80.0–100.0)
Platelets: 273 10*3/uL (ref 150–400)
RBC: 4.67 MIL/uL (ref 4.22–5.81)
RDW: 13.3 % (ref 11.5–15.5)
WBC: 7.6 10*3/uL (ref 4.0–10.5)
nRBC: 0 % (ref 0.0–0.2)

## 2022-06-25 LAB — POCT I-STAT 7, (LYTES, BLD GAS, ICA,H+H)
Acid-Base Excess: 0 mmol/L (ref 0.0–2.0)
Bicarbonate: 24.9 mmol/L (ref 20.0–28.0)
Calcium, Ion: 1.24 mmol/L (ref 1.15–1.40)
HCT: 40 % (ref 39.0–52.0)
Hemoglobin: 13.6 g/dL (ref 13.0–17.0)
O2 Saturation: 95 %
Potassium: 4.6 mmol/L (ref 3.5–5.1)
Sodium: 137 mmol/L (ref 135–145)
TCO2: 26 mmol/L (ref 22–32)
pCO2 arterial: 42.6 mmHg (ref 32–48)
pH, Arterial: 7.375 (ref 7.35–7.45)
pO2, Arterial: 79 mmHg — ABNORMAL LOW (ref 83–108)

## 2022-06-25 LAB — BASIC METABOLIC PANEL
Anion gap: 11 (ref 5–15)
BUN: 26 mg/dL — ABNORMAL HIGH (ref 8–23)
CO2: 25 mmol/L (ref 22–32)
Calcium: 9.2 mg/dL (ref 8.9–10.3)
Chloride: 100 mmol/L (ref 98–111)
Creatinine, Ser: 1.27 mg/dL — ABNORMAL HIGH (ref 0.61–1.24)
GFR, Estimated: 59 mL/min — ABNORMAL LOW (ref >60)
Glucose, Bld: 105 mg/dL — ABNORMAL HIGH (ref 70–99)
Potassium: 4.1 mmol/L (ref 3.5–5.1)
Sodium: 136 mmol/L (ref 135–145)

## 2022-06-25 SURGERY — RIGHT/LEFT HEART CATH AND CORONARY ANGIOGRAPHY
Anesthesia: LOCAL

## 2022-06-25 MED ORDER — VERAPAMIL HCL 2.5 MG/ML IV SOLN
INTRAVENOUS | Status: AC
  Filled 2022-06-25: qty 2

## 2022-06-25 MED ORDER — SODIUM CHLORIDE 0.9 % IV SOLN: INTRAVENOUS | Status: AC

## 2022-06-25 MED ORDER — LIDOCAINE HCL (PF) 1 % IJ SOLN
INTRAMUSCULAR | Status: AC
  Filled 2022-06-25: qty 30

## 2022-06-25 MED ORDER — LABETALOL HCL 5 MG/ML IV SOLN: 10.0000 mg | INTRAVENOUS | Status: AC | PRN

## 2022-06-25 MED ORDER — SODIUM CHLORIDE 0.9% FLUSH
3.0000 mL | Freq: Two times a day (BID) | INTRAVENOUS | Status: DC
  Administered 2022-06-25: 3 mL via INTRAVENOUS

## 2022-06-25 MED ORDER — SODIUM CHLORIDE 0.9 % IV SOLN: 250.0000 mL | INTRAVENOUS | Status: DC | PRN

## 2022-06-25 MED ORDER — SODIUM CHLORIDE 0.9% FLUSH: 3.0000 mL | INTRAVENOUS | Status: DC | PRN

## 2022-06-25 MED ORDER — VERAPAMIL HCL 2.5 MG/ML IV SOLN
INTRAVENOUS | Status: DC | PRN
  Administered 2022-06-25: 10 mL via INTRA_ARTERIAL

## 2022-06-25 MED ORDER — FENTANYL CITRATE (PF) 100 MCG/2ML IJ SOLN
INTRAMUSCULAR | Status: AC
  Filled 2022-06-25: qty 2

## 2022-06-25 MED ORDER — LIDOCAINE HCL (PF) 1 % IJ SOLN
INTRAMUSCULAR | Status: DC | PRN
  Administered 2022-06-25 (×2): 2 mL

## 2022-06-25 MED ORDER — ASPIRIN 81 MG PO CHEW
81.0000 mg | CHEWABLE_TABLET | ORAL | Status: AC
  Administered 2022-06-25: 81 mg via ORAL
  Filled 2022-06-25: qty 1

## 2022-06-25 MED ORDER — MIDAZOLAM HCL 2 MG/2ML IJ SOLN
INTRAMUSCULAR | Status: DC | PRN
  Administered 2022-06-25: 1 mg via INTRAVENOUS

## 2022-06-25 MED ORDER — IOHEXOL 350 MG/ML SOLN
INTRAVENOUS | Status: DC | PRN
  Administered 2022-06-25: 70 mL

## 2022-06-25 MED ORDER — HEPARIN (PORCINE) IN NACL 1000-0.9 UT/500ML-% IV SOLN
INTRAVENOUS | Status: DC | PRN
  Administered 2022-06-25 (×2): 500 mL

## 2022-06-25 MED ORDER — SODIUM CHLORIDE 0.9% FLUSH
3.0000 mL | Freq: Two times a day (BID) | INTRAVENOUS | Status: DC
  Administered 2022-06-25 (×2): 3 mL via INTRAVENOUS

## 2022-06-25 MED ORDER — MIDAZOLAM HCL 2 MG/2ML IJ SOLN
INTRAMUSCULAR | Status: AC
  Filled 2022-06-25: qty 2

## 2022-06-25 MED ORDER — HYDRALAZINE HCL 20 MG/ML IJ SOLN: 10.0000 mg | INTRAMUSCULAR | Status: AC | PRN

## 2022-06-25 MED ORDER — FENTANYL CITRATE (PF) 100 MCG/2ML IJ SOLN
INTRAMUSCULAR | Status: DC | PRN
  Administered 2022-06-25: 25 ug via INTRAVENOUS

## 2022-06-25 MED ORDER — SODIUM CHLORIDE 0.9 % IV SOLN: INTRAVENOUS | Status: DC

## 2022-06-25 MED ORDER — HEPARIN SODIUM (PORCINE) 1000 UNIT/ML IJ SOLN
INTRAMUSCULAR | Status: DC | PRN
  Administered 2022-06-25: 4500 [IU] via INTRAVENOUS

## 2022-06-25 MED ORDER — EMPAGLIFLOZIN 10 MG PO TABS
10.0000 mg | ORAL_TABLET | Freq: Every day | ORAL | Status: DC
  Administered 2022-06-25: 10 mg via ORAL
  Filled 2022-06-25: qty 1

## 2022-06-25 MED ORDER — HEPARIN SODIUM (PORCINE) 1000 UNIT/ML IJ SOLN
INTRAMUSCULAR | Status: AC
  Filled 2022-06-25: qty 10

## 2022-06-25 SURGICAL SUPPLY — 13 items
CATH BALLN WEDGE 5F 110CM (CATHETERS) IMPLANT
CATH INFINITI 5 FR JL3.5 (CATHETERS) IMPLANT
CATH OPTITORQUE TIG 4.0 5F (CATHETERS) IMPLANT
DEVICE RAD COMP TR BAND LRG (VASCULAR PRODUCTS) IMPLANT
GLIDESHEATH SLEND SS 6F .021 (SHEATH) IMPLANT
GUIDEWIRE INQWIRE 1.5J.035X260 (WIRE) IMPLANT
INQWIRE 1.5J .035X260CM (WIRE) ×1
KIT HEART LEFT (KITS) ×1 IMPLANT
PACK CARDIAC CATHETERIZATION (CUSTOM PROCEDURE TRAY) ×1 IMPLANT
SHEATH GLIDE SLENDER 4/5FR (SHEATH) IMPLANT
SHEATH PROBE COVER 6X72 (BAG) IMPLANT
TRANSDUCER W/STOPCOCK (MISCELLANEOUS) ×1 IMPLANT
TUBING CIL FLEX 10 FLL-RA (TUBING) ×1 IMPLANT

## 2022-06-25 NOTE — Progress Notes (Incomplete)
Patient Name: Arthur Klein Date of Encounter: 06/26/2022  Primary Cardiologist: Rollene Rotunda, MD Electrophysiologist: Regan Lemming, MD  Interval Summary   [facility] 4/24 with moderate but non obstructive RCA, Cx, and Marginal disease -> normalized cardiac pressures.  Feeling OK this am.  Inpatient Medications    Scheduled Meds:  acidophilus  1 capsule Oral Daily   enoxaparin (LOVENOX) injection  40 mg Subcutaneous Q24H   guaiFENesin  1,200 mg Oral BID   sacubitril-valsartan  1 tablet Oral BID   sodium chloride flush  3 mL Intravenous Q12H   sodium chloride flush  3 mL Intravenous Q12H   sodium chloride flush  3 mL Intravenous Q12H   Continuous Infusions:  sodium chloride     sodium chloride     PRN Meds: sodium chloride, sodium chloride, acetaminophen, colchicine, diphenhydrAMINE, ondansetron (ZOFRAN) IV, sodium chloride flush, sodium chloride flush   Vital Signs    Vitals:   06/26/22 0030 06/26/22 0107 06/26/22 0605 06/26/22 0718  BP: (!) 96/56  116/75 116/70  Pulse: 85  84 92  Resp: 18  18   Temp: 98.1 F (36.7 C)  98.1 F (36.7 C) (!) 97.3 F (36.3 C)  TempSrc: Oral  Oral Oral  SpO2:  92% 90% 92%  Weight:   86.7 kg   Height:        Intake/Output Summary (Last 24 hours) at 06/26/2022 0721 Last data filed at 06/26/2022 5284 Gross per 24 hour  Intake 1135 ml  Output 800 ml  Net 335 ml   Filed Weights   06/24/22 1330 06/25/22 0606 06/26/22 0605  Weight: 88 kg 87.1 kg 86.7 kg    Physical Exam    GEN- The patient is well appearing, alert and oriented x 3 today.   Lungs- Clear to ausculation bilaterally, normal work of breathing Cardiac- Regular rate and rhythm, no murmurs, rubs or gallops GI- soft, NT, ND, + BS Extremities- no clubbing or cyanosis. No edema  Telemetry    NSR 80-90s, occasional PVCs (personally reviewed)  Hospital Course    Arthur Klein is a 77 y.o. male with a history of DM2, PVCs, and bradycardia who is being seen today  for the evaluation of CHF at the request of Dr. Eden Emms.   Pt admitted with worsening SOB and + BNP.   Assessment & Plan    Acute systolic CHF  EF 13-24% 07/2021 and moderate asymmetric LVH noted, not previously noted.  EF this admission down to 20-25%, no LVH or AS noted R/LHC with normalized filling pressures and moderate but non-obstructive  Brisk diuresis noted.  Continue Entresto.  Has previously not tolerated BB due to bradycardia Start farxiga 10 mg daily Start spiro 12.5 Suspect with adding SGLT2 AND spiro can use lasix prn only.    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 Consider Monitor prior to EP MD follow up to consider AAD   DOE No need for cMRI or High res CT at this juncture given new CHF.  Arthur Klein follow on GDMT  EP Rielyn Krupinski see as needed while remains here  For questions or updates, please contact [facility] Please consult [email or web address] for contact info under Cardiology/STEMI.  Signed, Graciella Freer, PA-C  06/26/2022, 7:21 AM   I have seen and examined this patient with [name].  Agree with above, note added to reflect my findings.  Left  heart catheterization yesterday with nonobstructive disease and no evidence of volume overload.  Patient feeling well today.  GEN: Well nourished, well developed, in no acute distress  HEENT: normal  Neck: no JVD, carotid bruits, or masses Cardiac: RRR; no murmurs, rubs, or gallops,no edema  Respiratory:  clear to auscultation bilaterally, normal work of breathing GI: soft, nontender, nondistended, + BS MS: no deformity or atrophy  Skin: warm and dry Neuro:  Strength and sensation are intact Psych: euthymic mood, full affect   Acute systolic heart failure: Ejection fraction now 20 to 25%.  Left heart catheterization without obstructive disease and no evidence of volume  overload.  We have started his optimal medical therapy.  Has follow-up planned with primary cardiology in May PVCs: Minimal PVCs noted on telemetry during admission.  Sadia Belfiore hold off on therapy for now.  Keiaira Donlan have him see EP back in a few months once his heart failure therapy has been optimized and at that time, Cadynce Garrette likely plan for cardiac monitoring.  EP to sign off.  Continue current medications at discharge.  Follow-up to be arranged.  Audreyana Huntsberry M. Shandell Giovanni MD 06/26/2022 7:48 AM

## 2022-06-25 NOTE — TOC Progression Note (Addendum)
Transition of Care [facility]) - Progression Note    Patient Details  Name: Arthur Klein MRN: [id number] Date of Birth: Mar 26, 1945  Transition of Care [facility]) CM/SW Contact  Leone Haven, RN Phone Number: 06/25/2022, 3:11 PM  Clinical Narrative:    From home with wife, he states he does not have any DME at home. He states wife will transport him home at dc.  He has a PCP. He has a scale at home and weighs himself about every other day, but will try to weigh every day now.  He also has a bp cuff and he kind of eats what he wants to eat with no salt.  TOC following. He has no HH services at this time.         Expected Discharge Plan and Services                                               Social Determinants of Health (SDOH) Interventions SDOH Screenings   Food Insecurity: No Food Insecurity (06/24/2022)  Housing: Low Risk  (06/24/2022)  Transportation Needs: No Transportation Needs (06/24/2022)  Utilities: Not At Risk (06/24/2022)  Tobacco Use: High Risk (06/23/2022)    Readmission Risk Interventions     No data to display

## 2022-06-25 NOTE — Interval H&P Note (Signed)
History and Physical Interval Note:  06/25/2022 1:46 PM  Arthur Klein  has presented today for surgery, with the diagnosis of heart failure.  The various methods of treatment have been discussed with the patient and family. After consideration of risks, benefits and other options for treatment, the patient has consented to  Procedure(s): RIGHT/LEFT HEART CATH AND CORONARY ANGIOGRAPHY (N/A)  PERCUTANEOUS CORONARY INTERVENTION  as a surgical intervention.  The patient's history has been reviewed, patient examined, no change in status, stable for surgery.  I have reviewed the patient's chart and labs.  Questions were answered to the patient's satisfaction.    Cath Lab Visit (complete for each Cath Lab visit)  Clinical Evaluation Leading to the Procedure:   ACS: No.  Non-ACS:    Anginal Classification: No Symptoms -> presentation is with acute combined systolic and diastolic heart failure.  No angina symptoms.  Anti-ischemic medical therapy: Minimal Therapy (1 class of medications)  Non-Invasive Test Results: Equivocal test results: SignificantlyReduced EF on echo  Prior CABG: No previous CABG    Bryan Lemma

## 2022-06-25 NOTE — Plan of Care (Signed)

## 2022-06-25 NOTE — TOC Benefit Eligibility Note (Signed)
Patient Product/process development scientist completed.    The patient is currently admitted and upon discharge could be taking Jardiance 10 mg.  The current 30 day co-pay is $153.73 due to being in Coverage Gap (donut hole).   The patient is insured through [employer]   This test claim was processed through [facility]- copay amounts may vary at other pharmacies due to pharmacy/plan contracts, or as the patient moves through the different stages of their insurance plan.  Roland Earl, CPHT Pharmacy Patient Advocate Specialist [facility] Patient Advocate Team Direct Number: [phone]  Fax: [phone]

## 2022-06-25 NOTE — Progress Notes (Signed)
2000 patient alert x4 on room air able to make all needs known wife at bedside

## 2022-06-25 NOTE — Progress Notes (Addendum)
   Heart Failure Stewardship Pharmacist Progress Note   PCP: Alysia Penna, MD PCP-Cardiologist: Rollene Rotunda, MD    HPI:  77 yo M with PMH of CHF, T2DM, PVCs, and bradycardia.   Presented to the ED on 4/22 with shortness of breath, tachycardia, and chest heaviness. Last saw Dr. Elberta Fortis 06/20/22 burden of PVCs with flecainide at 4.9 % pt had increased SOB and Dr. Elberta Fortis concerned it was due to flecainide so flecainide held for 1 week. Pro BNP on the 4/19 was elevated at 4,042. CXR with patchy bilateral pulmonary infiltrates consistent with PNA. ECHO 4/23 showed LVEF 20-25% (was 40-45% 07/2021), global hypokinesis, G2DD, RV normal, mild-moderate MR.  Taken for [facility] on 4/24.  Discussed with patient and wife about Jardiance. He states that he tried this a year or so ago and had a rash from this and caused frequent urination. His wife states that his diabetes has been a roller coaster over the last few years but seems to be more controlled now.   Current HF Medications: ACE/ARB/ARNI: Entresto 49/51 mg BID SGLT2i: Jardiance 10 mg daily  Prior to admission HF Medications: ACE/ARB/ARNI: Entresto 49/51 mg BID  Pertinent Lab Values: Serum creatinine 1.27, BUN 26, Potassium 4.7, Sodium 138, BNP 1408.5, Magnesium 2.3  Vital Signs: Weight: 192 lbs (admission weight: 203 lbs) Blood pressure: 110/70s  Heart rate: 80s  I/O: -3L yesterday; net -3.6L  Medication Assistance / Insurance Benefits Check: Does the patient have prescription insurance?  Yes Type of insurance plan: UHC Medicare  Does the patient qualify for medication assistance through manufacturers or grants?   Yes Eligible grants and/or patient assistance programs: Clifton Custard Medication assistance applications in progress: [employer]  Medication assistance applications approved: none Approved medication assistance renewals will be completed by: pending  Outpatient Pharmacy:  Prior to admission outpatient  pharmacy: [facility] Is the patient willing to use [facility] TOC pharmacy at discharge? Yes Is the patient willing to transition their outpatient pharmacy to utilize a [facility] outpatient pharmacy?   No    Assessment: 1. Acute on chronic systolic CHF (LVEF 20-25%), pending ischemic evaluation. NYHA class II symptoms. - Off IV lasix for cath. Strict I/Os and daily weights. Keep K>4 and Mg>2. - Consider adding BB once euvolemic and cardiac output is preserved on cath - Continue Entresto 49/51 mg BID - Consider adding MRA prior to discharge - On Jardiance 10 mg daily - discussed with patient and was intolerant to this in the past. Concerned that uncontrolled diabetes could have caused this intolerance. Will check A1c and discuss with team about starting Farxiga instead.    Plan: 1) Medication changes recommended at this time: - Switch Jardiance to Farxiga  2) Patient assistance: - Jardiance copay [phone] (currently in coverage gap / donut hole) - Starting patient assistance applications for Sherryll Burger and Farxiga since copays are unaffordable in the donut hole  3)  Education  - Initial education completed - Full education to be completed prior to discharge  Sharen Hones, PharmD, BCPS Heart Failure Engineer, building services Phone [phone]

## 2022-06-26 ENCOUNTER — Other Ambulatory Visit (HOSPITAL_COMMUNITY): Payer: Self-pay

## 2022-06-26 ENCOUNTER — Encounter (HOSPITAL_COMMUNITY): Payer: Self-pay | Admitting: Cardiology

## 2022-06-26 ENCOUNTER — Telehealth (HOSPITAL_COMMUNITY): Payer: Self-pay

## 2022-06-26 LAB — BASIC METABOLIC PANEL
Anion gap: 10 (ref 5–15)
BUN: 23 mg/dL (ref 8–23)
CO2: 24 mmol/L (ref 22–32)
Calcium: 9.1 mg/dL (ref 8.9–10.3)
Chloride: 103 mmol/L (ref 98–111)
Creatinine, Ser: 1.14 mg/dL (ref 0.61–1.24)
GFR, Estimated: >60 mL/min (ref >60)
Glucose, Bld: 99 mg/dL (ref 70–99)
Potassium: 4.3 mmol/L (ref 3.5–5.1)
Sodium: 137 mmol/L (ref 135–145)

## 2022-06-26 LAB — HEMOGLOBIN A1C
Hgb A1c MFr Bld: 5.9 % — ABNORMAL HIGH (ref 4.8–5.6)
Hgb A1c MFr Bld: 6.2 % — ABNORMAL HIGH (ref 4.8–5.6)
Mean Plasma Glucose: 122.63 mg/dL
Mean Plasma Glucose: 131 mg/dL

## 2022-06-26 MED ORDER — ATORVASTATIN CALCIUM 40 MG PO TABS
40.0000 mg | ORAL_TABLET | Freq: Every day | ORAL | 0 refills | Status: DC
  Filled 2022-06-26: qty 30, 30d supply, fill #0

## 2022-06-26 MED ORDER — SPIRONOLACTONE 12.5 MG HALF TABLET: 12.5000 mg | ORAL_TABLET | Freq: Every day | ORAL | Status: DC

## 2022-06-26 MED ORDER — FUROSEMIDE 20 MG PO TABS
20.0000 mg | ORAL_TABLET | ORAL | 0 refills | Status: DC | PRN
  Filled 2022-06-26: qty 30, 30d supply, fill #0

## 2022-06-26 MED ORDER — SPIRONOLACTONE 25 MG PO TABS
12.5000 mg | ORAL_TABLET | Freq: Every day | ORAL | 0 refills | Status: DC
  Filled 2022-06-26: qty 30, 60d supply, fill #0

## 2022-06-26 MED ORDER — DAPAGLIFLOZIN PROPANEDIOL 10 MG PO TABS
10.0000 mg | ORAL_TABLET | Freq: Every day | ORAL | 0 refills | Status: DC
  Filled 2022-06-26: qty 30, 30d supply, fill #0

## 2022-06-26 MED ORDER — DAPAGLIFLOZIN PROPANEDIOL 10 MG PO TABS
10.0000 mg | ORAL_TABLET | Freq: Every day | ORAL | Status: DC
  Administered 2022-06-26: 10 mg via ORAL
  Filled 2022-06-26: qty 1

## 2022-06-26 NOTE — Progress Notes (Signed)
Pt is discharge instructions complete with IV and tele box removed. TOC Meds are delivered Wife at beside to transport patient home,

## 2022-06-26 NOTE — TOC Progression Note (Signed)
Transition of Care [facility]) - Progression Note    Patient Details  Name: Arthur Klein MRN: [id number] Date of Birth: 1945-08-15  Transition of Care [facility]) CM/SW Contact  Leone Haven, RN Phone Number: 06/26/2022, 9:01 AM  Clinical Narrative:    Patient is for dc today, he has no needs. TOC is to fill his medications.          Expected Discharge Plan and Services         Expected Discharge Date: 06/26/22                                     Social Determinants of Health (SDOH) Interventions SDOH Screenings   Food Insecurity: No Food Insecurity (06/24/2022)  Housing: Low Risk  (06/24/2022)  Transportation Needs: No Transportation Needs (06/24/2022)  Utilities: Not At Risk (06/24/2022)  Tobacco Use: High Risk (06/26/2022)    Readmission Risk Interventions     No data to display

## 2022-06-26 NOTE — Progress Notes (Signed)
   Heart Failure Stewardship Pharmacist Progress Note   PCP: Alysia Penna, MD PCP-Cardiologist: Rollene Rotunda, MD    HPI:  77 yo M with PMH of CHF, T2DM, PVCs, and bradycardia.   Presented to the ED on 4/22 with shortness of breath, tachycardia, and chest heaviness. Last saw Dr. Elberta Fortis 06/20/22 burden of PVCs with flecainide at 4.9 % pt had increased SOB and Dr. Elberta Fortis concerned it was due to flecainide so flecainide held for 1 week. Pro BNP on the 4/19 was elevated at 4,042. CXR with patchy bilateral pulmonary infiltrates consistent with PNA. ECHO 4/23 showed LVEF 20-25% (was 40-45% 07/2021), global hypokinesis, G2DD, RV normal, mild-moderate MR.  Taken for [facility] on 4/24.  Discussed with patient and wife about Jardiance. He states that he tried this a year or so ago and had a rash from this and caused frequent urination. His wife states that his diabetes has been a roller coaster over the last few years but seems to be more controlled now.   Discharge HF Medications: Diuretic: furosemide 20 mg daily PRN ACE/ARB/ARNI: Entresto 49/51 mg BID MRA: spironolactone 12.5 mg qhs SGLT2i: Farxiga 10 mg daily  Prior to admission HF Medications: ACE/ARB/ARNI: Entresto 49/51 mg BID  Pertinent Lab Values: Serum creatinine 1.14, BUN 23, Potassium 4.3, Sodium 137, BNP 1408.5, Magnesium 2.3, A1c 5.9  Vital Signs: Weight: 191 lbs (admission weight: 203 lbs) Blood pressure: 110/70s  Heart rate: 80s  I/O: +0.6L yesterday; net -3.3L  Medication Assistance / Insurance Benefits Check: Does the patient have prescription insurance?  Yes Type of insurance plan: UHC Medicare  Does the patient qualify for medication assistance through manufacturers or grants?   Yes Eligible grants and/or patient assistance programs: [employer]if[employer] Custard Medication assistance applications in progress: [employer]ifton Custard  Medication assistance applications approved: none Approved medication assistance renewals will be  completed by: pending  Outpatient Pharmacy:  [facility] to [facility] Is the patient willing to use [facility] TOC pharmacy at discharge? Yes Is the patient willing to transition their outpatient pharmacy to utilize a [facility] outpatient pharmacy?   No    Assessment: 1. Acute on chronic systolic CHF (LVEF 20-25%), pending ischemic evaluation. NYHA class II symptoms. - Agree with furosemide 20 mg daily PRN. Strict I/Os and daily weights. Keep K>4 and Mg>2. - Consider adding BB at follow up - Continue Entresto 49/51 mg BID - Agree with adding spironolactone 12.5 mg daily - Agree with adding Farxiga 10 mg daily   Plan: 1) Medication changes recommended at this time: - Agree with changes  2) Patient assistance: - Currently in coverage gap / donut hole - Starting patient assistance applications for Sherryll Burger and Farxiga since copays are unaffordable in the donut hole  3)  Education  - Patient has been educated on current HF medications and potential additions to HF medication regimen - Patient verbalizes understanding that over the next few months, these medication doses may change and more medications may be added to optimize HF regimen - Patient has been educated on basic disease state pathophysiology and goals of therapy   Sharen Hones, PharmD, BCPS Heart Failure Engineer, building services Phone [phone]

## 2022-06-26 NOTE — Telephone Encounter (Signed)
Heart Failure Patient Advocate Encounter  Medication assistance forms for Arthur Klein have been started. Patient has signed forms.  Provider information will need to be completed before submitting.  Forms have been attached to patient chart under 'Media' tab. Routing to office to be completed.  Burnell Blanks, CPhT Rx Patient Advocate Phone: [phone]

## 2022-06-26 NOTE — Progress Notes (Signed)
Heart Failure Nurse Navigator Progress Note  PCP: Alysia Penna, MD PCP-Cardiologist: Hochrein Admission Diagnosis: Congestive heart failure, unspecified Admitted from: Home  Presentation:   Arthur Klein presented with chest heaviness, shortness of breath, saw a [facility] satellite office , had labs drawn, with some elevations, instructed to come to the hospital. BNP 1,408, CXR shows pneumonia versus edema. R/LHC on 4/24,   Patient and wife were educated on the sign and symptoms of heart failure, daily weights, when to call his doctor or go to the ED,, Diet/ fluid restrictions, reported patient drinks about 1 soda per day, tries to watch his sodium intake, spoke about taking all medications as prescribed and attending all medical appointments, patient does use dip ( tobacco ) daily and stated he uses only a bit and he will not be stopping. Both patient and wife verbalized their understanding of education, a HF TOC appointment was scheduled for 07/16/2022 @ 10 am.   ECHO/ LVEF: 20-25% G2DD  Clinical Course:  Past Medical History:  Diagnosis Date   Arthritis    Complication of anesthesia    doesn't wake up well,combative until wife talks with him   Diabetes mellitus without complication    diet controlled    Esophageal reflux    Gout    hx of   H/O hiatal hernia    History of kidney stones    Hypertension    borderline HTNt   Plantar fasciitis    no problems now   PONV (postoperative nausea and vomiting)    Prostate cancer 08/27/2012   gleason 6, volume 59.3 cc   PVC (premature ventricular contraction)    hx of    Skin cancer 2011   melanoma on back, resected    Undescended left testes      Social History   Socioeconomic History   Marital status: Married    Spouse name: Not on file   Number of children: Not on file   Years of education: Not on file   Highest education level: Not on file  Occupational History   Not on file  Tobacco Use   Smoking status: Never    Smokeless tobacco: Current    Types: Chew   Tobacco comments:    Patient declines materials to quit chewing tobacco.  Substance and Sexual Activity   Alcohol use: No   Drug use: No   Sexual activity: Yes  Other Topics Concern   Not on file  Social History Narrative   Lives with wife.   Three children.     Social Determinants of Health   Financial Resource Strain: Not on file  Food Insecurity: No Food Insecurity (06/24/2022)   Hunger Vital Sign    Worried About Running Out of Food in the Last Year: Never true    Ran Out of Food in the Last Year: Never true  Transportation Needs: No Transportation Needs (06/24/2022)   PRAPARE - Administrator, Civil Service (Medical): No    Lack of Transportation (Non-Medical): No  Physical Activity: Not on file  Stress: Not on file  Social Connections: Not on file   Education Assessment and Provision:  Detailed education and instructions provided on heart failure disease management including the following:  Signs and symptoms of Heart Failure When to call the physician Importance of daily weights Low sodium diet Fluid restriction Medication management Anticipated future follow-up appointments  Patient education given on each of the above topics.  Patient acknowledges understanding via teach back method and  acceptance of all instructions.  Education Materials:  "Living Better With Heart Failure" Booklet, HF zone tool, & Daily Weight Tracker Tool.  Patient has scale at home: yes Patient has pill box at home: yes    High Risk Criteria for Readmission and/or Poor Patient Outcomes: Heart failure hospital admissions (last 6 months): 1  No Show rate: 0 Difficult social situation: No Demonstrates medication adherence: Yes Primary Language: English Literacy level: Reading, writing, and comprehension  Barriers of Care:   Diet/ fluid restrictions ( 1- soda daily) Daily weights  Considerations/Referrals:   Referral made to  Heart Failure Pharmacist Stewardship: Yes Referral made to Heart Failure CSW/NCM TOC: No Referral made to Heart & Vascular TOC clinic: Yes, 07/16/2022 @ 10 am   Items for Follow-up on DC/TOC: Diet/ fluid restrictions ( daily soda x 1)  Daily weights Continued HF education   Rhae Hammock, BSN, RN Heart Failure Print production planner Chat Only

## 2022-06-27 ENCOUNTER — Ambulatory Visit: Payer: Medicare Other | Admitting: Cardiology

## 2022-06-27 LAB — LIPOPROTEIN A (LPA): Lipoprotein (a): 19.3 nmol/L (ref <75.0)

## 2022-06-28 ENCOUNTER — Encounter: Payer: Self-pay | Admitting: Internal Medicine

## 2022-06-28 NOTE — Progress Notes (Addendum)
Arthur Klein started taking in am  Lasix --none Spironolactone change from pm to am   BP 106/62  PVCs currently quiescient   Reveiwed issues with pt andwife regarding change in EF And need for further meds

## 2022-06-30 NOTE — Discharge Summary (Signed)
Physician Discharge Summary  Arthur Klein OZH:[id number] DOB: May 15, 1945 DOA: 06/23/2022  PCP: Alysia Penna, MD  Admit date: 06/23/2022 Discharge date: 06/26/2022  Time spent: 45 minutes  Recommendations for Outpatient Follow-up:  CHF TOC clinic follow-up made for 5/15 PCP in 1 week, please check BMP at follow-up   Discharge Diagnoses:  Principal Problem: Acute on chronic systolic CHF Hyperglycemia   Frequent PVCs   Gout Borderline diabetes  Discharge Condition: Improved  Diet recommendation: Low-sodium, heart of the  [facility] Weights   06/24/22 1330 06/25/22 0606 06/26/22 0605  Weight: 88 kg 87.1 kg 86.7 kg    History of present illness:  77/M wy.o. 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 -history of frequent PVCs has been on flecainide about 7-8 months ago.  Last 2-3 weeks patient has been having increasing exertional dyspnea and cough, dry, denies any fever chills.  He was seen in cardiology clinic 3 days ago, advised to discontinue flecainide, despite this continues to have worsening dyspnea, progressed to orthopnea.  Morning of admission had chest pressure as well.  Subsequently presented to the ED, workup in the ED noted creatinine of 1.0, WBC 7.9, chest x-ray with cardiomegaly bilateral pulmonary vascular congestion versus infiltrates, multiple PVCs     Hospital Course:  Acute on chronic systolic CHF -Last echo 5/23 with EF of 40-45% moderate LVH, normal RV -Now echo with a EF down to 20-25%, grade 2 diastolic dysfunction -Right and left heart cath 4/24 noted moderate but nonobstructive RCA circumflex and marginal disease and normal filling pressures -Diuresed with IV Lasix, volume status has improved, transitioned to oral diuretics, followed by cardiology this admission, discharged on Entresto, Farxiga, Aldactone and low-dose Lasix -Follow-up in CHF [facility]  clinic, followed by Dr. Antoine Poche   Frequent PVCs -Recently taken off flecainide, EP consulted, recommended to hold off on therapy for now, recommended follow-up in few months once heart failure is optimized   Hyperglycemia -A1c is 6.2, consistent with borderline diabetes, now started on Farxiga for CHF as well   History of gout -On colchicine PRN     Consultations: Cards  Discharge Exam: Vitals:   06/26/22 0605 06/26/22 0718  BP: 116/75 116/70  Pulse: 84 92  Resp: 18   Temp: 98.1 F (36.7 C) (!) 97.3 F (36.3 C)  SpO2: 90% 92%   Gen: Awake, Alert, Oriented X 3,  HEENT: no JVD Lungs: Good air movement bilaterally, CTAB CVS: S1S2/RRR Abd: soft, Non tender, non distended, BS present Extremities: No edema Skin: no new rashes on exposed skin   Discharge Instructions   Discharge Instructions     Diet - low sodium heart healthy   Complete by: As directed    Increase activity slowly   Complete by: As directed       Allergies as of 06/26/2022       Reactions   Erythrocin Diarrhea   Erythromycin Diarrhea   Lisinopril Cough   Nsaids Other (See Comments)   Gi bleeding Has tolerated limited, small amounts of Meloxicam   Oxycodone Other (See Comments)   Became a "wild person" and had total amnesia        Medication List     STOP taking these medications    flecainide 100 MG tablet Commonly known as: TAMBOCOR       TAKE these medications    acetaminophen 500 MG tablet Commonly known as: TYLENOL Take 500-1,000 mg by  mouth every 6 (six) hours as needed for fever (for pain.).   atorvastatin 40 MG tablet Commonly known as: Lipitor Take 1 tablet (40 mg total) by mouth daily.   colchicine 0.6 MG tablet Take 0.6 mg by mouth 2 (two) times daily as needed.   diphenhydrAMINE 12.5 MG/5ML elixir Commonly known as: BENADRYL Take 3.125 mg by mouth 4 (four) times daily as needed (for allergies.).   Entresto 49-51 MG Generic drug: sacubitril-valsartan TAKE  1 TABLET BY MOUTH TWICE DAILY   Farxiga 10 MG Tabs tablet Generic drug: dapagliflozin propanediol Take 1 tablet (10 mg total) by mouth daily.   furosemide 20 MG tablet Commonly known as: Lasix Take 1 tablet (20 mg total) by mouth as needed for edema (weight gain 3lbs in 1 day or 5lbs in 1 week).   ICAPS AREDS 2 PO Take 1 capsule by mouth 2 (two) times daily.   Ozempic (0.25 or 0.5 MG/DOSE) 2 MG/1.5ML Sopn Generic drug: Semaglutide(0.25 or 0.5MG /DOS) Inject 0.5 mg into the skin once a week.   PROBIOTIC DAILY PO Take 1 tablet by mouth daily.   spironolactone 25 MG tablet Commonly known as: ALDACTONE Take 1/2 tablet (12.5 mg total) by mouth at bedtime.   Vitamin B Complex Tabs Take 1 tablet by mouth daily as needed (low energy).   Vitamin D-3 25 MCG (1000 UT) Caps Take 1,000 Units by mouth daily.       Allergies  Allergen Reactions   Erythrocin Diarrhea   Erythromycin Diarrhea   Lisinopril Cough   Nsaids Other (See Comments)    Gi bleeding Has tolerated limited, small amounts of Meloxicam   Oxycodone Other (See Comments)    Became a "wild person" and had total amnesia    Follow-up Information     Alysia Penna, MD Follow up.   Specialty: Internal Medicine Why: Please follow up in a week. Contact information: [address] [place] [address] [phone]         [facility]. Go in 20 day(s).   Specialty: Cardiology Why: Hospital follow up 07/16/2022 @ 10 am PLEASE bring a current medication list to appointment FREE valet parking, Entrance C, off [employer] information: [address] [id number] mc [place] [address] [phone]                 The results of significant diagnostics from this hospitalization (including imaging, microbiology, ancillary and laboratory) are listed below for reference.    Significant Diagnostic Studies: CARDIAC  CATHETERIZATION  Result Date: 06/25/2022   Suezanne Jacquet Cx lesion is 30% stenosed.   1st Mrg-1 lesion is 50% stenosed.  1st Mrg-2 lesion is 40% stenosed.   Prox RCA lesion is 50% stenosed.  Mid RCA lesion is 20% stenosed.   LV end diastolic pressure is normal.   Right Heart Pressures are normal.   There is no aortic valve stenosis. 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  No flow-limiting lesions. Normal right heart cath pressures with LV EDP and PCWP of 6 to 9 mmHg.  Mean PAP 15 mmHg. Cardiac Output and Index: 5.01-2.47 RECOMMENDATION Return to nursing for ongoing care.  Defer decision making to our ED  further management to general cardiology service and EP. Bryan Lemma, MD  ECHOCARDIOGRAM COMPLETE  Result Date: 06/24/2022    ECHOCARDIOGRAM REPORT   Patient Name:   Arthur Klein Date of Exam: 06/24/2022 Medical Rec #:  [id number]     Height:       69.0 in Accession #:    [id number]    Weight:       194.0 lb Date of Birth:  03/03/1946     BSA:          2.039 m Patient Age:    76 years      BP:           101/67 mmHg Patient Gender: M             HR:           90 bpm. Exam Location:  Inpatient Procedure: 2D Echo, Cardiac Doppler, Color Doppler and Intracardiac            Opacification Agent Indications:    CHF-Acute Systolic I50.21  History:        Patient has prior history of Echocardiogram examinations, most                 recent 07/16/2021. Risk Factors:Hypertension and Diabetes.  Sonographer:    Eulah Pont RDCS Referring Phys: [id number] Emeline General IMPRESSIONS  1. There is no left ventricular thrombus (Definity contrast was used). There is adverse spherical remodeling of the left ventricle. Left ventricular ejection  fraction, by estimation, is 20 to 25%. Left ventricular ejection fraction by 3D volume is 25 %.  The left ventricle has severely decreased function. The left ventricle demonstrates global hypokinesis. The left ventricular internal cavity size was mildly dilated. Left ventricular diastolic parameters are consistent with Grade II diastolic dysfunction (pseudonormalization). Elevated left atrial pressure.  2. Right ventricular systolic function is normal. The right ventricular size is normal. Tricuspid regurgitation signal is inadequate for assessing PA pressure.  3. Left atrial size was moderately dilated.  4. The mitral valve is normal in structure. Mild to moderate mitral valve regurgitation. No evidence of mitral stenosis.  5. The aortic valve is tricuspid. There is mild calcification of the aortic valve. There is mild thickening of the aortic valve. Aortic valve regurgitation is trivial. Aortic valve sclerosis/calcification is present, without any evidence of aortic stenosis. Comparison(s): Prior images reviewed side by side. The left ventricular function has improved. (even when accounting for overestimation of LV EF on the previous study- when it was probably 30%). FINDINGS  Left Ventricle: There is no left ventricular thrombus (Definity contrast was used). There is adverse spherical remodeling of the left ventricle. Left ventricular ejection fraction, by estimation, is 20 to 25%. Left ventricular ejection fraction by 3D volume is 25 %. The left ventricle has severely decreased function. The left ventricle demonstrates global hypokinesis. Definity contrast agent was given IV to delineate the left ventricular endocardial borders. The left ventricular internal cavity size was mildly dilated. There is no left ventricular hypertrophy. Left ventricular diastolic parameters are consistent with Grade II diastolic dysfunction (pseudonormalization). Elevated left atrial pressure. Right Ventricle: The right ventricular  size is normal. No increase in right ventricular wall thickness. Right ventricular systolic function is normal. Tricuspid regurgitation signal is inadequate for assessing PA pressure. Left Atrium: Left atrial size was moderately dilated. Right Atrium: Right atrial size was normal in size. Pericardium: There is no evidence of pericardial effusion. Mitral Valve: The mitral valve is normal in structure. Mild to  moderate mitral valve regurgitation, with centrally-directed jet. No evidence of mitral valve stenosis. MV peak gradient, 3.6 mmHg. The mean mitral valve gradient is 2.0 mmHg. Tricuspid Valve: The tricuspid valve is normal in structure. Tricuspid valve regurgitation is not demonstrated. Aortic Valve: The aortic valve is tricuspid. There is mild calcification of the aortic valve. There is mild thickening of the aortic valve. Aortic valve regurgitation is trivial. Aortic regurgitation PHT measures 434 msec. Aortic valve sclerosis/calcification is present, without any evidence of aortic stenosis. Aortic valve mean gradient measures 8.3 mmHg. Aortic valve peak gradient measures 14.8 mmHg. Aortic valve area, by VTI measures 1.33 cm. Pulmonic Valve: The pulmonic valve was grossly normal. Pulmonic valve regurgitation is trivial. Aorta: The aortic root and ascending aorta are structurally normal, with no evidence of dilitation. IAS/Shunts: No atrial level shunt detected by color flow Doppler.  LEFT VENTRICLE PLAX 2D LVIDd:         6.40 cm         Diastology LVIDs:         5.90 cm         LV e' medial:    4.62 cm/s LV PW:         0.90 cm         LV E/e' medial:  18.9 LV IVS:        0.90 cm         LV e' lateral:   6.18 cm/s LVOT diam:     2.00 cm         LV E/e' lateral: 14.1 LV SV:         44 LV SV Index:   21 LVOT Area:     3.14 cm        3D Volume EF                                LV 3D EF:    Left                                             ventricul LV Volumes (MOD)                            ar LV vol d, MOD     230.0 ml                   ejection A2C:                                        fraction LV vol d, MOD    185.5 ml                   by 3D A4C:                                        volume is LV vol s, MOD    156.0 ml                   25 %. A2C: LV vol s, MOD    134.5 ml A4C:  3D Volume EF: LV SV MOD A2C:   74.0 ml       3D EF:        25 % LV SV MOD A4C:   185.5 ml      LV EDV:       246 ml LV SV MOD BP:    65.1 ml       LV ESV:       183 ml                                LV SV:        63 ml RIGHT VENTRICLE RV S prime:     10.60 cm/s TAPSE (M-mode): 1.9 cm LEFT ATRIUM             Index        RIGHT ATRIUM          Index LA diam:        4.70 cm 2.30 cm/m   RA Area:     9.30 cm LA Vol (A2C):   52.4 ml 25.69 ml/m  RA Volume:   15.80 ml 7.75 ml/m LA Vol (A4C):   51.7 ml 25.35 ml/m LA Biplane Vol: 53.5 ml 26.23 ml/m  AORTIC VALVE                     PULMONIC VALVE AV Area (Vmax):    1.29 cm      PR End Diast Vel: 6.97 msec AV Area (Vmean):   1.22 cm AV Area (VTI):     1.33 cm AV Vmax:           192.67 cm/s AV Vmean:          135.667 cm/s AV VTI:            0.329 m AV Peak Grad:      14.8 mmHg AV Mean Grad:      8.3 mmHg LVOT Vmax:         79.00 cm/s LVOT Vmean:        52.500 cm/s LVOT VTI:          0.139 m LVOT/AV VTI ratio: 0.42 AI PHT:            434 msec  AORTA Ao Root diam: 3.70 cm Ao Asc diam:  3.50 cm MITRAL VALVE MV Area (PHT): 4.31 cm       SHUNTS MV Area VTI:   1.65 cm       Systemic VTI:  0.14 m MV Peak grad:  3.6 mmHg       Systemic Diam: 2.00 cm MV Mean grad:  2.0 mmHg MV Vmax:       0.96 m/s MV Vmean:      68.7 cm/s MV Decel Time: 176 msec MR Peak grad:    78.5 mmHg MR Mean grad:    54.0 mmHg MR Vmax:         443.00 cm/s MR Vmean:        349.0 cm/s MR PISA:         1.57 cm MR PISA Eff ROA: 14 mm MR PISA Radius:  0.50 cm MV E velocity: 87.40 cm/s MV A velocity: 69.70 cm/s MV E/A ratio:  1.25 Mihai Croitoru MD Electronically signed by Thurmon Fair MD Signature  Date/Time: 06/24/2022/4:18:27 PM    Final    DG Chest 1 View  Result Date: 06/24/2022 CLINICAL  DATA:  CHF EXAM: CHEST  1 VIEW COMPARISON:  Yesterday FINDINGS: Bilateral airspace disease greater towards the left and mainly perihilar. No Kerley lines, effusion, or pneumothorax. Normal heart size and mediastinal contours. IMPRESSION: Left more than right airspace disease with patchy appearance favoring pneumonia. Electronically Signed   By: Tiburcio Pea M.D.   On: 06/24/2022 05:48   DG Chest 2 View  Result Date: 06/23/2022 CLINICAL DATA:  Shortness of breath EXAM: CHEST - 2 VIEW COMPARISON:  10/27/2012 FINDINGS: Upper normal heart size. Mediastinal contours and pulmonary vascularity normal. Patchy BILATERAL pulmonary infiltrates asymmetrically greater on LEFT consistent with multifocal pneumonia. No pleural effusion or pneumothorax. Osseous demineralization. IMPRESSION: Patchy BILATERAL pulmonary infiltrates greater on LEFT consistent with multifocal pneumonia. Electronically Signed   By: Ulyses Southward M.D.   On: 06/23/2022 11:00    Microbiology: Recent Results (from the past 240 hour(s))  SARS Coronavirus 2 by RT PCR (hospital order, performed in [facility] hospital lab) *cepheid single result test* Anterior Nasal Swab     Status: None   Collection Time: 06/23/22 11:19 AM   Specimen: Anterior Nasal Swab  Result Value Ref Range Status   SARS Coronavirus 2 by RT PCR NEGATIVE NEGATIVE Final    Comment: Performed at [facility], [address] [place], [place] [address]     Labs: Basic Metabolic Panel: Recent Labs  Lab 06/24/22 0245 06/24/22 0956 06/25/22 0032 06/25/22 1415 06/25/22 1422 06/25/22 1423 06/26/22 0106  NA 137  --  136 137 138 138 137  K 3.7  --  4.1 4.6 4.5 4.7 4.3  CL 102  --  100  --   --   --  103  CO2 24  --  25  --   --   --  24  GLUCOSE 103*  --  105*  --   --   --  99  BUN 15  --  26*  --   --   --  23  CREATININE 0.98  --  1.27*  --   --   --  1.14   CALCIUM 9.0  --  9.2  --   --   --  9.1  MG  --  2.3  --   --   --   --   --    Liver Function Tests: No results for input(s): "AST", "ALT", "ALKPHOS", "BILITOT", "PROT", "ALBUMIN" in the last 168 hours. No results for input(s): "LIPASE", "AMYLASE" in the last 168 hours. No results for input(s): "AMMONIA" in the last 168 hours. CBC: Recent Labs  Lab 06/25/22 1415 06/25/22 1422 06/25/22 1423 06/25/22 1542  WBC  --   --   --  7.6  HGB 13.6 13.6 13.9 13.6  HCT 40.0 40.0 41.0 40.4  MCV  --   --   --  86.5  PLT  --   --   --  273   Cardiac Enzymes: No results for input(s): "CKTOTAL", "CKMB", "CKMBINDEX", "TROPONINI" in the last 168 hours. BNP: BNP (last 3 results) Recent Labs    06/23/22 1028  BNP 1,408.5*    ProBNP (last 3 results) Recent Labs    06/20/22 1130  PROBNP 4,042*    CBG: No results for input(s): "GLUCAP" in the last 168 hours.     Signed:  Zannie Cove MD.  [facility] 06/30/2022, 2:41 PM

## 2022-07-13 DIAGNOSIS — I5021 Acute systolic (congestive) heart failure: Secondary | ICD-10-CM | POA: Insufficient documentation

## 2022-07-13 DIAGNOSIS — R0989 Other specified symptoms and signs involving the circulatory and respiratory systems: Secondary | ICD-10-CM | POA: Insufficient documentation

## 2022-07-13 NOTE — Progress Notes (Unsigned)
Cardiology Office Note:   Date:  07/14/2022  ID:  Arthur Klein, DOB [birth year], MRN [id number]  History of Present Illness:   Arthur Klein is a 77 y.o. male who presents for evaluation of bradycardia.  The patient recently was noted to have palpitations and an abnormal EKG.  He previously had PVCs noted in 2017.  At that time he had an abnormal stress test suggesting reduced ejection fraction.  There was no ischemia or infarction.  A month later follow-up echo demonstrated EF of 50 - 55%.      In Dec 2022 Echo suggested that the EF was slightly lower than previous at 40 - 45% so I changed him to [place].  He has had some episodes of dizziness and had a monitor with runs of NSVT.  This was in Feb 2023.   He had a perfusion study to rule out obstructive CAD.  He had no ischemia.  He did have an appt with Dr. Elberta Fortis as he had a high burden of ventricular ectopy on a monitor and was started on flecainide.   Monitor demonstrated a significantly reduced PVC burden.  At the last visit I increased Entresto.  When he saw Dr. Elberta Fortis in [place] 2024 he held the Flecainide for a week to see if his SOB improved.  He was in the hospital last month for acute on chronic HF.  His most recent echo in April 2024 demonstrated that his EF was down to 20 - 25%.  He had a cardiac cath with some mild non obstructive disease.   His right heart pressures were not markedly abnormal.  It was decided to hold the flecainide at discharge.    Since going home he is trying to be active but he does get fatigued if he does too much.  He is not having any resting shortness of breath, PND or orthopnea.  His weights have been very stable and he weighs himself every day.  He is watching salt.  He feels much better than when he was in the hospital.  He is not feeling palpitations and has had no presyncope or syncope.  He had no chest pressure, neck or arm discomfort.   ROS: As stated in the HPI and negative for all other systems.  Studies  Reviewed:    EKG:  NA    Risk Assessment/Calculations:    CHA2DS2-VASc Score = 2   This indicates a 2.2% annual risk of stroke. The patient's score is based upon: CHF History: 1 HTN History: 0 Diabetes History: 0 Stroke History: 0 Vascular Disease History: 0 Age Score: 1 Gender Score: 0    Physical Exam:   VS:  BP 124/64   Pulse 91   Ht 5\' 9"  (1.753 m)   Wt 191 lb 8 oz (86.9 kg)   SpO2 97%   BMI 28.28 kg/m    Wt Readings from Last 3 Encounters:  07/14/22 191 lb 8 oz (86.9 kg)  06/26/22 191 lb 1.6 oz (86.7 kg)  06/20/22 203 lb (92.1 kg)     GEN: Well nourished, well developed in no acute distress NECK: No JVD; No carotid bruits CARDIAC: RRR, no murmurs, rubs, gallops RESPIRATORY:  Clear to auscultation without rales, wheezing or rhonchi  ABDOMEN: Soft, non-tender, non-distended EXTREMITIES:  No edema; No deformity   ASSESSMENT AND PLAN:   PVCs:   I am going to send a message to EP to see if there is any other thoughts about antiarrhythmics given his high burden.  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  He has been permanently taken off of flecainide.   SYSTOLIC HF:   EF was down to 29% on the recent echo.  He is on optimal medical therapy and has not tolerated med titration because of hypotension and bradycardia cardia in the past.  He will remain on the meds as listed.  I am going to be ordering an MRI to look for any infiltrative disease though I have a low suspicion of this.  DM: His A1c was 5.9.  No change in therapy.   BRUIT:  He had no significant disease on Doppler.  No change in therapy   FOOT PAIN: ABIs demonstrated no significant disease.  No change in therapy.  Might have neuropathy.      Signed, Rollene Rotunda, MD

## 2022-07-14 ENCOUNTER — Encounter: Payer: Self-pay | Admitting: Cardiology

## 2022-07-14 ENCOUNTER — Ambulatory Visit: Payer: Medicare Other | Attending: Cardiology | Admitting: Cardiology

## 2022-07-14 VITALS — BP 124/64 | HR 91 | Ht 69.0 in | Wt 191.5 lb

## 2022-07-14 DIAGNOSIS — I5021 Acute systolic (congestive) heart failure: Secondary | ICD-10-CM

## 2022-07-14 DIAGNOSIS — Z7984 Long term (current) use of oral hypoglycemic drugs: Secondary | ICD-10-CM

## 2022-07-14 DIAGNOSIS — I493 Ventricular premature depolarization: Secondary | ICD-10-CM | POA: Diagnosis not present

## 2022-07-14 DIAGNOSIS — R0989 Other specified symptoms and signs involving the circulatory and respiratory systems: Secondary | ICD-10-CM | POA: Diagnosis not present

## 2022-07-14 DIAGNOSIS — E118 Type 2 diabetes mellitus with unspecified complications: Secondary | ICD-10-CM | POA: Diagnosis not present

## 2022-07-14 NOTE — Patient Instructions (Addendum)
Medication Instructions:  Your physician recommends that you continue on your current medications as directed. Please refer to the Current Medication list given to you today.  *If you need a refill on your cardiac medications before your next appointment, please call your pharmacy*   Testing/Procedures: See below   Follow-Up: At [facility], you and your health needs are our priority.  As part of our continuing mission to provide you with exceptional heart care, we have created designated Provider Care Teams.  These Care Teams include your primary Cardiologist (physician) and Advanced Practice Providers (APPs -  Physician Assistants and Nurse Practitioners) who all work together to provide you with the care you need, when you need it.  We recommend signing up for the patient portal called "MyChart".  Sign up information is provided on this After Visit Summary.  MyChart is used to connect with patients for Virtual Visits (Telemedicine).  Patients are able to view lab/test results, encounter notes, upcoming appointments, etc.  Non-urgent messages can be sent to your provider as well.   To learn more about what you can do with MyChart, go to [email or web address].    Your next appointment:   3 month(s)  Provider:   Rollene Rotunda, MD     **Need to check regarding knee implants** Other Instructions   You are scheduled for Cardiac MRI on ______________. Please arrive for your appointment at ______________ ( arrive 30-45 minutes prior to test start time). ?  [facility] [address] [place], [place] [address] [phone] Please take advantage of the free valet parking available at the MAIN entrance (A entrance).  Proceed to the [facility] Radiology Department (First Floor) for check-in.    Magnetic resonance imaging (MRI) is a painless test that produces images of the inside of the body without using Xrays.  During an MRI, strong magnets and radio waves  work together in a Data processing manager to form detailed images.   MRI images may provide more details about a medical condition than X-rays, CT scans, and ultrasounds can provide.  You may be given earphones to listen for instructions.  You may eat a light breakfast and take medications as ordered with the exception of furosemide, hydrochlorothiazide, or spironolactone(fluid pill, other). Please avoid stimulants for 12 hr prior to test. (Ie. Caffeine, nicotine, chocolate, or antihistamine medications)  If a contrast material will be used, an IV will be inserted into one of your veins. Contrast material will be injected into your IV. It will leave your body through your urine within a day. You may be told to drink plenty of fluids to help flush the contrast material out of your system.  You will be asked to remove all metal, including: Watch, jewelry, and other metal objects including hearing aids, hair pieces and dentures. Also wearable glucose monitoring systems (ie. Freestyle Libre and Omnipods) (Braces and fillings normally are not a problem.)   TEST WILL TAKE APPROXIMATELY 1 HOUR  PLEASE NOTIFY SCHEDULING AT LEAST 24 HOURS IN ADVANCE IF YOU ARE UNABLE TO KEEP YOUR APPOINTMENT. [phone]  Please call Rockwell Alexandria, cardiac imaging nurse navigator with any questions/concerns. Rockwell Alexandria RN Navigator Cardiac Imaging Larey Brick RN Navigator Cardiac Imaging [facility] [phone] Office

## 2022-07-15 ENCOUNTER — Telehealth (HOSPITAL_COMMUNITY): Payer: Self-pay

## 2022-07-15 NOTE — Progress Notes (Incomplete)
HEART & VASCULAR TRANSITION OF CARE CONSULT NOTE     Referring Physician: Dr. Jomarie Longs Primary Care: Dr. Link Snuffer Primary Cardiologist: Dr. Antoine Poche  HPI: Referred to clinic by Dr. Jomarie Longs for heart failure consultation. 77 y.o. male with history of HFmrEF, PVCs treated with flecainide, DM II.   Echo 12/22: EF 40-45%  Cardiac monitor 02/23 28% PVC burden. Has been followed by EP and treated with flecainide. PVC burden improved to 4.9% on monitor 10/23.  Echo 05/23: EF 40-45%, moderate asymmetric LVH  Saw Dr. Elberta Fortis 06/20/22 and noted worsening dyspnea. There  was concern this may be 2/2 flecainide so it was held. proBNP elevated at 4,042.   He continued to feel poorly and was admitted 06/23/22 with a/c CHF. He was diuresed with IV lasix. Echo with EF 20-25%, RV okay, moderate LAE, mild to moderate MR. GDMT titrated but limited by hypotenstion and hx bradycardia. R/LHC demonstrated moderate nonobstructive CAD, normal filling pressures and CO/CI 5.01/2.47. He was seen by EP. Minimal PVCs on tele. Plan was to keep off antiarrhythmic therapy and reassess once on optimal GDMT.   He saw Dr. Antoine Poche for f/u on 05/13. Cardiac MRI ordered to r/o infiltrative disease.   He is here today for hospital follow-up. Patient has been very fatigued. His wife reports his blood pressure has been low at times. BP averages 90s-100s/50s-60s on home monitoring. He gets lightheaded at times with position changes. Dyspnea has improved quite a bit. No orthopnea, PND or LE edema. Home weight has been stable between 188-189 lb. Watching fluid and sodium intake diligently. Has not needed to take lasix.    Past Medical History:  Diagnosis Date   Arthritis    Complication of anesthesia    doesn't wake up well,combative until wife talks with him   Diabetes mellitus without complication (HCC)    diet controlled    Esophageal reflux    Gout    hx of   H/O hiatal hernia    History of kidney stones     Hypertension    borderline HTNt   Plantar fasciitis    no problems now   PONV (postoperative nausea and vomiting)    Prostate cancer (HCC) 08/27/2012   gleason 6, volume 59.3 cc   PVC (premature ventricular contraction)    hx of    Skin cancer 2011   melanoma on back, resected    Undescended left testes     Current Outpatient Medications  Medication Sig Dispense Refill   acetaminophen (TYLENOL) 500 MG tablet Take 500-1,000 mg by mouth every 6 (six) hours as needed for fever (for pain.).      atorvastatin (LIPITOR) 40 MG tablet Take 1 tablet (40 mg total) by mouth daily. 30 tablet 0   B Complex Vitamins (VITAMIN B COMPLEX) TABS Take 1 tablet by mouth daily as needed (low energy).     Cholecalciferol (VITAMIN D-3) 1000 UNITS CAPS Take 1,000 Units by mouth daily.      colchicine 0.6 MG tablet Take 0.6 mg by mouth 2 (two) times daily as needed.     dapagliflozin propanediol (FARXIGA) 10 MG TABS tablet Take 1 tablet (10 mg total) by mouth daily. 30 tablet 0   diphenhydrAMINE (BENADRYL) 12.5 MG/5ML elixir Take 3.125 mg by mouth 4 (four) times daily as needed (for allergies.).     furosemide (LASIX) 20 MG tablet Take 1 tablet (20 mg total) by mouth as needed for edema (weight gain 3lbs in 1 day or 5lbs in 1  week). 30 tablet 0   Multiple Vitamins-Minerals (ICAPS AREDS 2 PO) Take 1 capsule by mouth 2 (two) times daily.     Probiotic Product (PROBIOTIC DAILY PO) Take 1 tablet by mouth daily.     sacubitril-valsartan (ENTRESTO) 24-26 MG Take 1 tablet by mouth 2 (two) times daily. 60 tablet 11   spironolactone (ALDACTONE) 25 MG tablet Take 1/2 tablet (12.5 mg total) by mouth at bedtime. 30 tablet 0   No current facility-administered medications for this encounter.    Allergies  Allergen Reactions   Erythrocin Diarrhea   Erythromycin Diarrhea   Lisinopril Cough   Nsaids Other (See Comments)    Gi bleeding Has tolerated limited, small amounts of Meloxicam   Oxycodone Other (See Comments)     Became a "wild person" and had total amnesia      Social History   Socioeconomic History   Marital status: Married    Spouse name: Debbie   Number of children: 3   Years of education: Not on file   Highest education level: High school graduate  Occupational History   Occupation: Retired  Tobacco Use   Smoking status: Never   Smokeless tobacco: Current    Types: Chew   Tobacco comments:    Patient declines materials to quit chewing tobacco.  Vaping Use   Vaping Use: Never used  Substance and Sexual Activity   Alcohol use: No   Drug use: No   Sexual activity: Yes  Other Topics Concern   Not on file  Social History Narrative   Lives with wife.   Three children.     Social Determinants of Health   Financial Resource Strain: Low Risk  (06/26/2022)   Overall Financial Resource Strain (CARDIA)    Difficulty of Paying Living Expenses: Not very hard  Food Insecurity: No Food Insecurity (06/24/2022)   Hunger Vital Sign    Worried About Running Out of Food in the Last Year: Never true    Ran Out of Food in the Last Year: Never true  Transportation Needs: No Transportation Needs (06/24/2022)   PRAPARE - Administrator, Civil Service (Medical): No    Lack of Transportation (Non-Medical): No  Physical Activity: Not on file  Stress: Not on file  Social Connections: Not on file  Intimate Partner Violence: Not At Risk (06/24/2022)   Humiliation, Afraid, Rape, and Kick questionnaire    Fear of Current or Ex-Partner: No    Emotionally Abused: No    Physically Abused: No    Sexually Abused: No      Family History  Problem Relation Age of Onset   Congestive Heart Failure Mother    Diabetes Mother    Hypertension Mother    Diabetes Father    Hypertension Father    Stroke Father    Cancer Father        prostate, pancreas   Cancer Brother 47       prostate- xrt, seed implant   Anesthesia problems Neg Hx    Hypotension Neg Hx    Malignant hyperthermia Neg Hx     Pseudochol deficiency Neg Hx     Vitals:   07/16/22 0951  BP: 124/62  Pulse: (!) 46  SpO2: 97%  Weight: 87.7 kg (193 lb 6.4 oz)    PHYSICAL EXAM: General:  Well appearing. Wife present. HEENT: normal Neck: supple. no JVD. Carotids 2+ bilat; no bruits.  Cor: PMI nondisplaced. Regular rate & rhythm with ectopy. No rubs, gallops or  murmurs. Lungs: clear Abdomen: soft, nontender, nondistended.  Extremities: no cyanosis, clubbing, rash, edema Neuro: alert & oriented x 3. Affect pleasant.  ECG: SR with PVCs in bigeminy pattern   ASSESSMENT & PLAN: HFrEF/NICM -Echo 05/23: EF 40-45% -Echo 04/24: EF 20-25%, RV okay, moderate LAE, mild to moderate MR -[facility] 04/24: Moderate nonobstructive CAD, normal filling pressures, CO/CI 5.01/2.47 -Etiology possibly PVC mediated. Burden as high as 28% in the past and improved to 4.9% on monitor 10/23 (while on flecainide). Flecainide recently stopped. Not on antiarrhythmic. PVCs in bigeminy pattern on ECG today. Discussed with EP, will place 7 day Zio today to assess burden. May consider mexiletine or amiodarone. -Has been scheduled for cMRI in July to exclude other etiologies NYHA early III GDMT  Diuretic-PRN lasix (has not needed) BB-No, has not tolerated in the past d/t bradycardia Ace/ARB/ARNI-Reduce entresto to 24/26 mg BID d/t orthostatic dizziness and low BP MRA-spiro 12.5 mg daily SGLT2i-farxiga 10 mg daily -Labs today -Consider sleep study  PVCs -Now off flecainide -PVCs in bigeminy pattern on ECG today -Has f/u with EP later this month -See discussion above  CAD -LHC 04/24: 50% OM1, 40% OM2, 50% RCA -On statin -Consider addition of aspirin but does have hx of GI bleed  Borderline DM -Most recent A1c 5.9% -Unable to afford semaglutide -Continue Farxiga     Referred to HFSW (PCP, Medications, Transportation, ETOH Abuse, Drug Abuse, Insurance, Financial ): No Refer to Pharmacy: Yes, assistance with HF meds Refer to Home  Health: No Refer to Advanced Heart Failure Clinic: Yes  Refer to General Cardiology: No, already established  Follow up Establish with Dr. Gasper Lloyd 08/06/22, will follow along with Dr. Antoine Poche (has f/u in August).

## 2022-07-15 NOTE — Telephone Encounter (Signed)
Called to confirm Heart & Vascular Transitions of Care appointment at 07/16/22. Patient reminded to bring all medications and pill box organizer with them. Confirmed patient has transportation. Gave directions, instructed to utilize valet parking.  Confirmed appointment prior to ending call.   

## 2022-07-16 ENCOUNTER — Encounter (HOSPITAL_COMMUNITY): Payer: Self-pay

## 2022-07-16 ENCOUNTER — Other Ambulatory Visit (HOSPITAL_COMMUNITY): Payer: Self-pay

## 2022-07-16 ENCOUNTER — Telehealth (HOSPITAL_COMMUNITY): Payer: Self-pay

## 2022-07-16 ENCOUNTER — Inpatient Hospital Stay (HOSPITAL_COMMUNITY)
Admission: RE | Admit: 2022-07-16 | Discharge: 2022-07-16 | Disposition: A | Discharge status: Home / Self Care | Payer: Medicare Other | Source: Ambulatory Visit | Attending: Cardiology | Admitting: Cardiology

## 2022-07-16 ENCOUNTER — Other Ambulatory Visit (HOSPITAL_COMMUNITY): Payer: Self-pay | Admitting: Cardiology

## 2022-07-16 ENCOUNTER — Ambulatory Visit (HOSPITAL_COMMUNITY)
Admit: 2022-07-16 | Discharge: 2022-07-16 | Disposition: A | Discharge status: Home / Self Care | Payer: Medicare Other | Attending: Physician Assistant | Admitting: Physician Assistant

## 2022-07-16 VITALS — BP 124/62 | HR 46 | Wt 193.4 lb

## 2022-07-16 DIAGNOSIS — I493 Ventricular premature depolarization: Secondary | ICD-10-CM | POA: Diagnosis not present

## 2022-07-16 DIAGNOSIS — I2510 Atherosclerotic heart disease of native coronary artery without angina pectoris: Secondary | ICD-10-CM | POA: Insufficient documentation

## 2022-07-16 DIAGNOSIS — R7303 Prediabetes: Secondary | ICD-10-CM | POA: Diagnosis not present

## 2022-07-16 DIAGNOSIS — I110 Hypertensive heart disease with heart failure: Secondary | ICD-10-CM | POA: Insufficient documentation

## 2022-07-16 DIAGNOSIS — R5383 Other fatigue: Secondary | ICD-10-CM | POA: Insufficient documentation

## 2022-07-16 DIAGNOSIS — R008 Other abnormalities of heart beat: Secondary | ICD-10-CM | POA: Insufficient documentation

## 2022-07-16 DIAGNOSIS — Z720 Tobacco use: Secondary | ICD-10-CM | POA: Insufficient documentation

## 2022-07-16 DIAGNOSIS — Z85828 Personal history of other malignant neoplasm of skin: Secondary | ICD-10-CM | POA: Diagnosis not present

## 2022-07-16 DIAGNOSIS — M109 Gout, unspecified: Secondary | ICD-10-CM | POA: Diagnosis not present

## 2022-07-16 DIAGNOSIS — I5022 Chronic systolic (congestive) heart failure: Secondary | ICD-10-CM | POA: Insufficient documentation

## 2022-07-16 DIAGNOSIS — I5020 Unspecified systolic (congestive) heart failure: Secondary | ICD-10-CM

## 2022-07-16 DIAGNOSIS — Z87442 Personal history of urinary calculi: Secondary | ICD-10-CM | POA: Diagnosis not present

## 2022-07-16 DIAGNOSIS — R42 Dizziness and giddiness: Secondary | ICD-10-CM | POA: Insufficient documentation

## 2022-07-16 DIAGNOSIS — Z79899 Other long term (current) drug therapy: Secondary | ICD-10-CM | POA: Insufficient documentation

## 2022-07-16 DIAGNOSIS — I959 Hypotension, unspecified: Secondary | ICD-10-CM | POA: Diagnosis not present

## 2022-07-16 DIAGNOSIS — Z7984 Long term (current) use of oral hypoglycemic drugs: Secondary | ICD-10-CM | POA: Insufficient documentation

## 2022-07-16 DIAGNOSIS — I428 Other cardiomyopathies: Secondary | ICD-10-CM | POA: Diagnosis not present

## 2022-07-16 LAB — BASIC METABOLIC PANEL
Anion gap: 8 (ref 5–15)
BUN: 23 mg/dL (ref 8–23)
CO2: 24 mmol/L (ref 22–32)
Calcium: 9.6 mg/dL (ref 8.9–10.3)
Chloride: 104 mmol/L (ref 98–111)
Creatinine, Ser: 1.29 mg/dL — ABNORMAL HIGH (ref 0.61–1.24)
GFR, Estimated: 58 mL/min — ABNORMAL LOW (ref >60)
Glucose, Bld: 116 mg/dL — ABNORMAL HIGH (ref 70–99)
Potassium: 5 mmol/L (ref 3.5–5.1)
Sodium: 136 mmol/L (ref 135–145)

## 2022-07-16 LAB — BRAIN NATRIURETIC PEPTIDE: B Natriuretic Peptide: 507.7 pg/mL — ABNORMAL HIGH (ref 0.0–100.0)

## 2022-07-16 MED ORDER — ENTRESTO 24-26 MG PO TABS
1.0000 | ORAL_TABLET | Freq: Two times a day (BID) | ORAL | 11 refills | Status: DC
Start: 2022-07-16 — End: 2022-10-07

## 2022-07-16 NOTE — Patient Instructions (Signed)
Decrease Entresto to 24/26 mg twice daily - new Rx sent. Labs drawn today - will call you if abnormal. 7 day Zio monitor placed today.  Return to see Dr. Gasper Lloyd in 2 weeks - see below. Please call us at [phone] if any questions or concerns.    Your provider has recommended that  you wear a Zio Patch for 7 days.  This monitor will record your heart rhythm for our review.  IF you have any symptoms while wearing the monitor please press the button.  If you have any issues with the patch or you notice a red or orange light on it please call the company at [phone].  Once you remove the patch please mail it back to the company as soon as possible so we can get the results.   Zio patch placed onto patient.  All instructions and information reviewed with patient, they verbalize understanding with no questions.

## 2022-07-16 NOTE — Telephone Encounter (Signed)
Advanced Heart Failure Patient Advocate Encounter  The patient was approved for a [employer] grant that will help cover the cost of Entresto, Farxiga, Spironolactone.  Total amount awarded, $10,000.  Effective: 06/16/2022 - 06/15/2023.  BIN [id number] PCN [id number] [id number] ID [id number]  Provided billing information to [facility] directly, informed patient and wife by phone.  Burnell Blanks, CPhT Rx Patient Advocate Phone: [phone]

## 2022-07-16 NOTE — Progress Notes (Signed)
Zio patch placed onto patient.  All instructions and information reviewed with patient, they verbalize understanding with no questions. 

## 2022-07-19 LAB — FLECAINIDE LEVEL: Flecainide: 0.11 ug/ml — ABNORMAL LOW (ref 0.20–1.00)

## 2022-07-21 ENCOUNTER — Other Ambulatory Visit (HOSPITAL_COMMUNITY): Payer: Self-pay

## 2022-07-22 ENCOUNTER — Other Ambulatory Visit (HOSPITAL_COMMUNITY): Payer: Self-pay

## 2022-07-22 ENCOUNTER — Other Ambulatory Visit (HOSPITAL_COMMUNITY): Payer: Self-pay | Admitting: Physician Assistant

## 2022-07-22 MED ORDER — DAPAGLIFLOZIN PROPANEDIOL 10 MG PO TABS: 10.0000 mg | ORAL_TABLET | Freq: Every day | ORAL | 0 refills | Status: DC

## 2022-07-22 MED ORDER — ATORVASTATIN CALCIUM 40 MG PO TABS: 40.0000 mg | ORAL_TABLET | Freq: Every day | ORAL | 0 refills | Status: DC

## 2022-07-23 ENCOUNTER — Other Ambulatory Visit (HOSPITAL_COMMUNITY): Payer: Self-pay

## 2022-07-31 NOTE — Progress Notes (Signed)
  Electrophysiology Office Note:   Date:  08/01/2022  ID:  Arthur Klein, DOB April 25, 1945, MRN [id number]  Primary Cardiologist: Rollene Rotunda, MD Electrophysiologist: Will Jorja Loa, MD -> Dr. Graciela Husbands by pt request  History of Present Illness:   Arthur Klein is a 77 y.o. male with h/o chronic systolic CHF, Frequent PVCs, Non obstructive CAD seen today for post hospital follow up.    Admitted 4/22 - 06/26/22 with acute systolic CHF. Cath with non-obstructive CAD. Echo with EF 20-25%. Started on GDMT and flecainide stopped with new CHF. Initially PVCs remained quiescent in hospital, so alternate AAD was not started in lieu of titrating GDMT.   Monitor 07/16/2022 showed PVCs now recurrent back to 28.3%. (4.9% 12/2021)  Since discharge from hospital the patient reports doing better. He was significantly fatigued for the first week and has gradually started feeling better. He is aware that his PVCs have picked back up.  he denies chest pain,  PND, orthopnea, nausea, vomiting, dizziness, syncope, edema, weight gain, or early satiety.   Review of systems complete and found to be negative unless listed in HPI.   Studies Reviewed:    EKG is not ordered today. EKG from 07/16/2022 reviewed which showed NSR with bigeminal PVCs with + concordance in V1-V6, RS in II - aVF, rS in lead 1, qr in aVL.  Monitor 07/16/2022 showed PVCs now recurrent back to 28.3%. (4.9% 12/2021)  LHC 06/25/2022    Ost Cx lesion is 30% stenosed.   1st Mrg-1 lesion is 50% stenosed.  1st Mrg-2 lesion is 40% stenosed.   Prox RCA lesion is 50% stenosed.  Mid RCA lesion is 20% stenosed.   LV end diastolic pressure is normal.   Right Heart Pressures are normal.   There is no aortic valve stenosis.  Physical Exam:   VS:  BP (!) 104/48   Pulse (!) 47   Ht 5' 8.5" (1.74 m)   Wt 186 lb 6.4 oz (84.6 kg)   SpO2 95%   BMI 27.93 kg/m    Wt Readings from Last 3 Encounters:  08/01/22 186 lb 6.4 oz (84.6 kg)  07/16/22 193 lb 6.4 oz  (87.7 kg)  07/14/22 191 lb 8 oz (86.9 kg)     GEN: Well nourished, well developed in no acute distress NECK: No JVD; No carotid bruits CARDIAC: Regular rate and rhythm with frequent ectopy, no murmurs, rubs, gallops RESPIRATORY:  Clear to auscultation without rales, wheezing or rhonchi  ABDOMEN: Soft, non-tender, non-distended EXTREMITIES:  No edema; No deformity   ASSESSMENT AND PLAN:    Frequent PVCs Burden back up to ~30% off flecainide Low suspicion that mexitil would sufficiently treat Will start amiodarone 200 mg BID x 2 weeks then 200 mg daily. May be able to consider coming off once EF is better, and assessing his burden.  Recent TSH stable, CMET today.   Chronic systolic CHF EF 20-25% 06/2022 Has not previously tolerated BB with bradycardia Continue Entresto, already down-titrated with hypotension once Continue spiro Continue Farxiga  CAD Continue statin.    Follow up with Dr. Graciela Husbands  in 6-8 weeks for follow up and amio surveillance.    Signed, Graciella Freer, PA-C

## 2022-08-01 ENCOUNTER — Ambulatory Visit: Payer: Medicare Other | Attending: Student | Admitting: Student

## 2022-08-01 ENCOUNTER — Other Ambulatory Visit: Payer: Self-pay | Admitting: Student

## 2022-08-01 ENCOUNTER — Encounter: Payer: Self-pay | Admitting: Student

## 2022-08-01 VITALS — BP 104/48 | HR 47 | Ht 68.5 in | Wt 186.4 lb

## 2022-08-01 DIAGNOSIS — I493 Ventricular premature depolarization: Secondary | ICD-10-CM | POA: Diagnosis not present

## 2022-08-01 DIAGNOSIS — I428 Other cardiomyopathies: Secondary | ICD-10-CM | POA: Diagnosis not present

## 2022-08-01 DIAGNOSIS — I2510 Atherosclerotic heart disease of native coronary artery without angina pectoris: Secondary | ICD-10-CM

## 2022-08-01 MED ORDER — AMIODARONE HCL 200 MG PO TABS: ORAL_TABLET | ORAL | 0 refills | Status: DC

## 2022-08-01 NOTE — Patient Instructions (Addendum)
Medication Instructions:  1.Start amiodarone (Pacerone) 200 mg twice daily for 2 weeks, then decrease to 200 mg daily thereafter *If you need a refill on your cardiac medications before your next appointment, please call your pharmacy*  Lab Work: CMET-TODAY If you have labs (blood work) drawn today and your tests are completely normal, you will receive your results only by: MyChart Message (if you have MyChart) OR A paper copy in the mail If you have any lab test that is abnormal or we need to change your treatment, we will call you to review the results.  Follow-Up: At [facility], you and your health needs are our priority.  As part of our continuing mission to provide you with exceptional heart care, we have created designated Provider Care Teams.  These Care Teams include your primary Cardiologist (physician) and Advanced Practice Providers (APPs -  Physician Assistants and Nurse Practitioners) who all work together to provide you with the care you need, when you need it.  Your next appointment:   6-8 week(s)  Provider:   Sherryl Manges, MD

## 2022-08-02 ENCOUNTER — Other Ambulatory Visit: Payer: Self-pay | Admitting: Home Health

## 2022-08-02 ENCOUNTER — Telehealth: Payer: Self-pay | Admitting: Home Health

## 2022-08-02 ENCOUNTER — Other Ambulatory Visit: Payer: Self-pay | Admitting: Internal Medicine

## 2022-08-02 DIAGNOSIS — E875 Hyperkalemia: Secondary | ICD-10-CM

## 2022-08-02 LAB — COMPREHENSIVE METABOLIC PANEL
CO2: 20 mmol/L (ref 20–29)
Chloride: 102 mmol/L (ref 96–106)
Sodium: 137 mmol/L (ref 134–144)
Total Protein: 7.2 g/dL (ref 6.0–8.5)

## 2022-08-02 MED ORDER — LOKELMA 10 G PO PACK: 10.0000 g | PACK | Freq: Every day | ORAL | 0 refills | Status: AC

## 2022-08-02 MED ORDER — LOKELMA 10 G PO PACK: 10.0000 g | PACK | Freq: Every day | ORAL | 0 refills | Status: DC

## 2022-08-02 NOTE — Progress Notes (Signed)
Notified pt re K5.9 Advised to take lasix 20 once and lokelma 10 mg po for 2 days. Repeat bmp on Monday.

## 2022-08-02 NOTE — Telephone Encounter (Signed)
Patient's wife called after-hours line, reports that they were instructed to take Lasix and a new medication for elevated potassium on blood work 08/01/2022.  She states [facility] did not receive any new prescription today.  Patient has taken Lasix 20 mg today.  Noted overnight fellow Dr Hector Brunswick ordered Thompson Caul 10 g daily for 2 doses for hyperkalemia and advised patient to obtain BMP on Monday, 08/04/2022. Noted K 5.9 on 08/01/22, historically not elevated, on Entresto and spiro for cardiomyopathy.  Re-sent Lokelma to [facility] today, verified pharmacy address with the wife. Patient states his primary cardiologist will be Dr Graciela Husbands now.  Appointment arranged with [facility] office on 08/04/22 at 1130am for blood work. Advised the patient to take [facility] as ordered, repeat BMP Monday, will forward this message to Dr Graciela Husbands (his cardiologist), shall further adjustment of his GDMT needed. Patient expressed appreciation, agreeable with above plan.

## 2022-08-04 ENCOUNTER — Other Ambulatory Visit: Payer: Self-pay

## 2022-08-04 ENCOUNTER — Telehealth: Payer: Self-pay | Admitting: Student

## 2022-08-04 ENCOUNTER — Ambulatory Visit: Payer: Medicare Other

## 2022-08-04 LAB — BASIC METABOLIC PANEL
BUN/Creatinine Ratio: 27 — ABNORMAL HIGH (ref 10–24)
BUN: 32 mg/dL — ABNORMAL HIGH (ref 8–27)
CO2: 25 mmol/L (ref 20–29)
Calcium: 9.9 mg/dL (ref 8.6–10.2)
Chloride: 105 mmol/L (ref 96–106)
Creatinine, Ser: 1.2 mg/dL (ref 0.76–1.27)
Glucose: 114 mg/dL — ABNORMAL HIGH (ref 70–99)
Potassium: 4.9 mmol/L (ref 3.5–5.2)
Sodium: 141 mmol/L (ref 134–144)
eGFR: 63 mL/min/{1.73_m2} (ref >59)

## 2022-08-04 LAB — COMPREHENSIVE METABOLIC PANEL
ALT: 35 IU/L (ref 0–44)
AST: 25 IU/L (ref 0–40)
Albumin/Globulin Ratio: 1.6 (ref 1.2–2.2)
Albumin: 4.4 g/dL (ref 3.8–4.8)
Alkaline Phosphatase: 104 IU/L (ref 44–121)
BUN/Creatinine Ratio: 20 (ref 10–24)
BUN: 28 mg/dL — ABNORMAL HIGH (ref 8–27)
Bilirubin Total: 0.3 mg/dL (ref 0.0–1.2)
Calcium: 9.9 mg/dL (ref 8.6–10.2)
Creatinine, Ser: 1.37 mg/dL — ABNORMAL HIGH (ref 0.76–1.27)
Globulin, Total: 2.8 g/dL (ref 1.5–4.5)
Glucose: 104 mg/dL — ABNORMAL HIGH (ref 70–99)
Potassium: 5.9 mmol/L (ref 3.5–5.2)
eGFR: 54 mL/min/{1.73_m2} — ABNORMAL LOW (ref >59)

## 2022-08-04 NOTE — Telephone Encounter (Signed)
Spoke with patient's wife, Gavin Pound, to notify the lab results are not back yet and we will follow up with her in the morning when the results are available. Gavin Pound was appreciative of the call and update.

## 2022-08-04 NOTE — Telephone Encounter (Signed)
5.9 K has been previously reported and pt having repeat labs 08/04/22.

## 2022-08-04 NOTE — Telephone Encounter (Signed)
Clydie Braun from [employer] [facility] calling with critical lab results

## 2022-08-04 NOTE — Telephone Encounter (Signed)
Pt wife called in and would like to know if the lab results that he re did today has come back .  She is concerned and would like to hear back from someone today.  She stated this was the labs after he took the meds he was suppose to take to help bring his potassium down   Best number [phone]

## 2022-08-06 ENCOUNTER — Ambulatory Visit (HOSPITAL_COMMUNITY)
Admission: RE | Admit: 2022-08-06 | Discharge: 2022-08-06 | Disposition: A | Discharge status: Home / Self Care | Payer: Medicare Other | Source: Ambulatory Visit | Attending: Cardiology | Admitting: Cardiology

## 2022-08-06 ENCOUNTER — Telehealth (HOSPITAL_COMMUNITY): Payer: Self-pay

## 2022-08-06 ENCOUNTER — Encounter (HOSPITAL_COMMUNITY): Payer: Self-pay | Admitting: Cardiology

## 2022-08-06 VITALS — BP 120/70 | HR 48 | Wt 190.0 lb

## 2022-08-06 DIAGNOSIS — I493 Ventricular premature depolarization: Secondary | ICD-10-CM

## 2022-08-06 DIAGNOSIS — Z7984 Long term (current) use of oral hypoglycemic drugs: Secondary | ICD-10-CM | POA: Diagnosis not present

## 2022-08-06 DIAGNOSIS — E875 Hyperkalemia: Secondary | ICD-10-CM | POA: Insufficient documentation

## 2022-08-06 DIAGNOSIS — K219 Gastro-esophageal reflux disease without esophagitis: Secondary | ICD-10-CM | POA: Diagnosis not present

## 2022-08-06 DIAGNOSIS — I428 Other cardiomyopathies: Secondary | ICD-10-CM | POA: Diagnosis not present

## 2022-08-06 DIAGNOSIS — E119 Type 2 diabetes mellitus without complications: Secondary | ICD-10-CM | POA: Diagnosis not present

## 2022-08-06 DIAGNOSIS — Z79899 Other long term (current) drug therapy: Secondary | ICD-10-CM | POA: Diagnosis not present

## 2022-08-06 DIAGNOSIS — I110 Hypertensive heart disease with heart failure: Secondary | ICD-10-CM | POA: Insufficient documentation

## 2022-08-06 DIAGNOSIS — I509 Heart failure, unspecified: Secondary | ICD-10-CM | POA: Insufficient documentation

## 2022-08-06 DIAGNOSIS — I2510 Atherosclerotic heart disease of native coronary artery without angina pectoris: Secondary | ICD-10-CM

## 2022-08-06 DIAGNOSIS — I5022 Chronic systolic (congestive) heart failure: Secondary | ICD-10-CM

## 2022-08-06 LAB — BRAIN NATRIURETIC PEPTIDE: B Natriuretic Peptide: 375.7 pg/mL — ABNORMAL HIGH (ref 0.0–100.0)

## 2022-08-06 MED ORDER — ROSUVASTATIN CALCIUM 10 MG PO TABS: 10.0000 mg | ORAL_TABLET | ORAL | 3 refills | Status: DC

## 2022-08-06 MED ORDER — ATORVASTATIN CALCIUM 10 MG PO TABS: 10.0000 mg | ORAL_TABLET | ORAL | 3 refills | Status: DC

## 2022-08-06 MED ORDER — DAPAGLIFLOZIN PROPANEDIOL 10 MG PO TABS: 10.0000 mg | ORAL_TABLET | Freq: Every day | ORAL | 11 refills | Status: DC

## 2022-08-06 MED ORDER — METOPROLOL SUCCINATE ER 25 MG PO TB24: 12.5000 mg | ORAL_TABLET | Freq: Every evening | ORAL | 3 refills | Status: DC

## 2022-08-06 NOTE — Telephone Encounter (Signed)
Spoke to patient and aware to start Crestor 10 mg 3 times a week Pt aware, agreeable, and verbalized understanding

## 2022-08-06 NOTE — Patient Instructions (Signed)
STOP Spironolactone.  CHANGE Crestor to 10 mg every other day.  START Toprol XL 12.5 mg ( 1/2 Tab) nightly.  Labs done today, your results will be available in MyChart, we will contact you for abnormal readings.  Your physician recommends that you schedule a follow-up appointment in: 2 months  If you have any questions or concerns before your next appointment please send [place] a message through [place] or call our office at [phone].    TO LEAVE A MESSAGE FOR THE NURSE SELECT OPTION 2, PLEASE LEAVE A MESSAGE INCLUDING: YOUR NAME DATE OF BIRTH CALL BACK NUMBER REASON FOR CALL**this is important as we prioritize the call backs  YOU WILL RECEIVE A CALL BACK THE SAME DAY AS LONG AS YOU CALL BEFORE 4:00 PM  At the [facility], you and your health needs are our priority. As part of our continuing mission to provide you with exceptional heart care, we have created designated Provider Care Teams. These Care Teams include your primary Cardiologist (physician) and Advanced Practice Providers (APPs- Physician Assistants and Nurse Practitioners) who all work together to provide you with the care you need, when you need it.   You may see any of the following providers on your designated Care Team at your next follow up: Dr Arvilla Meres Dr Marca Ancona Dr. Marcos Eke, NP Robbie Lis, [facility] [place], [place] Brynda Peon, NP Karle Plumber, PharmD   Please be sure to bring in all your medications bottles to every appointment.    Thank you for choosing [facility]

## 2022-08-06 NOTE — Progress Notes (Signed)
ADVANCED HEART FAILURE CLINIC NOTE  Referring Physician: Alysia Penna, MD  Primary Care: Alysia Penna, MD Primary Cardiologist: Dr. Antoine Poche EP: Dr. Elberta Fortis HF: Gasper Lloyd  HPI: Arthur Klein is a 77 y.o. 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  His cardiac history dates back to at least 2022 with echocardiogram demonstrating EF of 40 to 45% at that time.  In early 2023 he wore a Zio patch with 28% PVC burden; started on flecainide with improvement in PVC burden to 4.9% on repeat monitor in October 2023.  He was admitted in April 2024 with acute on chronic heart failure exacerbation; echocardiogram at that time with a EF of 20 to 25%.  Right and left heart catheterization with nonobstructive CAD and cardiac index of 2.47. He was diuresed and started on GDMT. He had a repeat ziopatch from  07/16/22-07/23/22 w/ a 28% PVC burden. Now he is on amiodarone 200mg  BID and presents to establish care.    From a functional standpoint, he becomes fatigued after moderate exertion. He can perform ADLs without difficulty, walk from parking garage to clinic appointment without difficulty but still reports becoming very fatigued since his hospitalization and recurrence of PVCs. In addition he is becoming some what frustrated by medical management and is wishing to move forward with any procedure that would help his symptoms / PVC burden.   Activity level/exercise tolerance:  NYHA IIB-III; difficult to assess.  Orthopnea:  Sleeps on 2 pillows Paroxysmal noctural dyspnea:  No Chest pain/pressure:  No Orthostatic lightheadedness:  No Palpitations:  minimal Lower extremity edema: trace pretibial edema Presyncope/syncope:  No Cough:  No  Past Medical History:  Diagnosis Date   Arthritis    Complication of anesthesia    doesn't wake up well,combative until wife talks with him   Diabetes mellitus without complication (HCC)     diet controlled    Esophageal reflux    Gout    hx of   H/O hiatal hernia    History of kidney stones    Hypertension    borderline HTNt   Plantar fasciitis    no problems now   PONV (postoperative nausea and vomiting)    Prostate cancer (HCC) 08/27/2012   gleason 6, volume 59.3 cc   PVC (premature ventricular contraction)    hx of    Skin cancer 2011   melanoma on back, resected    Undescended left testes     Current Outpatient Medications  Medication Sig Dispense Refill   acetaminophen (TYLENOL) 500 MG tablet Take 500-1,000 mg by mouth every 6 (six) hours as needed for fever (for pain.).      amiodarone (PACERONE) 200 MG tablet Take 1 tablet by mouth twice daily for 2 weeks, then decrease to 1 tablet by mouth daily thereafter 45 tablet 0   B Complex Vitamins (VITAMIN B COMPLEX) TABS Take 1 tablet by mouth daily as needed (low energy).     Cholecalciferol (VITAMIN D-3) 1000 UNITS CAPS Take 1,000 Units by mouth daily.      colchicine 0.6 MG tablet Take 0.6 mg by mouth 2 (two) times daily as needed.     dapagliflozin propanediol (FARXIGA) 10 MG TABS tablet Take 1 tablet (10 mg total) by mouth daily. 30 tablet 0   diphenhydrAMINE (BENADRYL) 12.5 MG/5ML elixir Take 3.125 mg by mouth 4 (four) times daily as needed (for allergies.).     furosemide (LASIX) 20 MG tablet Take 1  tablet (20 mg total) by mouth as needed for edema (weight gain 3lbs in 1 day or 5lbs in 1 week). 30 tablet 0   Multiple Vitamins-Minerals (ICAPS AREDS 2 PO) Take 1 capsule by mouth 2 (two) times daily.     Probiotic Product (PROBIOTIC DAILY PO) Take 1 tablet by mouth daily.     sacubitril-valsartan (ENTRESTO) 24-26 MG Take 1 tablet by mouth 2 (two) times daily. 60 tablet 11   spironolactone (ALDACTONE) 25 MG tablet Take 1/2 tablet (12.5 mg total) by mouth at bedtime. 30 tablet 0   atorvastatin (LIPITOR) 40 MG tablet TAKE 1 TABLET(40 MG) BY MOUTH DAILY (Patient not taking: Reported on 08/06/2022) 90 tablet 3   No  current facility-administered medications for this encounter.    Allergies  Allergen Reactions   Erythrocin Diarrhea   Erythromycin Diarrhea   Lisinopril Cough   Nsaids Other (See Comments)    Gi bleeding Has tolerated limited, small amounts of Meloxicam   Oxycodone Other (See Comments)    Became a "wild person" and had total amnesia      Social History   Socioeconomic History   Marital status: Married    Spouse name: Debbie   Number of children: 3   Years of education: Not on file   Highest education level: High school graduate  Occupational History   Occupation: Retired  Tobacco Use   Smoking status: Never   Smokeless tobacco: Current    Types: Chew   Tobacco comments:    Patient declines materials to quit chewing tobacco.  Vaping Use   Vaping Use: Never used  Substance and Sexual Activity   Alcohol use: No   Drug use: No   Sexual activity: Yes  Other Topics Concern   Not on file  Social History Narrative   Lives with wife.   Three children.     Social Determinants of Health   Financial Resource Strain: Low Risk  (06/26/2022)   Overall Financial Resource Strain (CARDIA)    Difficulty of Paying Living Expenses: Not very hard  Food Insecurity: No Food Insecurity (06/24/2022)   Hunger Vital Sign    Worried About Running Out of Food in the Last Year: Never true    Ran Out of Food in the Last Year: Never true  Transportation Needs: No Transportation Needs (06/24/2022)   PRAPARE - Administrator, Civil Service (Medical): No    Lack of Transportation (Non-Medical): No  Physical Activity: Not on file  Stress: Not on file  Social Connections: Not on file  Intimate Partner Violence: Not At Risk (06/24/2022)   Humiliation, Afraid, Rape, and Kick questionnaire    Fear of Current or Ex-Partner: No    Emotionally Abused: No    Physically Abused: No    Sexually Abused: No      Family History  Problem Relation Age of Onset   Congestive Heart Failure  Mother    Diabetes Mother    Hypertension Mother    Diabetes Father    Hypertension Father    Stroke Father    Cancer Father        prostate, pancreas   Cancer Brother 48       prostate- xrt, seed implant   Anesthesia problems Neg Hx    Hypotension Neg Hx    Malignant hyperthermia Neg Hx    Pseudochol deficiency Neg Hx     PHYSICAL EXAM: Vitals:   08/06/22 0912  BP: 120/70  Pulse: (!) 48  SpO2:  96%   GENERAL: Well nourished, well developed, and in no apparent distress at rest.  HEENT: Negative for arcus senilis or xanthelasma. There is no scleral icterus.  The mucous membranes are pink and moist.   NECK: Supple, No masses. Normal carotid upstrokes without bruits. No masses or thyromegaly.    CHEST: There are no chest wall deformities. There is no chest wall tenderness. Respirations are unlabored.  Lungs- CTA B/L CARDIAC:  JVP: 7 cm H2O         Normal S1, S2  Normal rate with regular rhythm. No murmurs, rubs or gallops.  Pulses are 2+ and symmetrical in upper and lower extremities. No edema.  ABDOMEN: Soft, non-tender, non-distended. There are no masses or hepatomegaly. There are normal bowel sounds.  EXTREMITIES: Warm and well perfused with no cyanosis, clubbing.  LYMPHATIC: No axillary or supraclavicular lymphadenopathy.  NEUROLOGIC: Patient is oriented x3 with no focal or lateralizing neurologic deficits.  PSYCH: Patients affect is appropriate, there is no evidence of anxiety or depression.  SKIN: Warm and dry; no lesions or wounds.   DATA REVIEW  ECG: 08/06/22: NSR with frequent PVCs  ECHO: 06/24/22: LVEF 20-25%, normal RV function as per my interpretation  CATH: 06/25/22: Diffuse ectasia of the RCA and LCx Moderate to severe concentric calcification in the proximal to mid LAD with mild to moderate concentric calcification in the proximal to mid LCx and RCA Most notable lesion is a 50% proximal RCA lesion and a bend between ectatic segments, otherwise moderate disease  in OM1 and D2.  No flow-limiting lesions. Normal right heart cath pressures with LV EDP and PCWP of 6 to 9 mmHg.  Mean PAP 15 mmHg. Cardiac Output and Index: 5.01-2.47   ASSESSMENT & PLAN:  Heart failure with reduced EF Etiology of ZO:[id number] cardiomyopathy; LHC/RHC above. Does not appear to have infiltrative cardiomyopathy, however, CMR pending for 10/01/22 for further evaluation. LVEF reduction likely secondary to high PVC burden NYHA class / AHA Stage:NYHA IIB-III Volume status & Diuretics: Euvolemic  Vasodilators:Entresto 24/26mg  BID, SBP at goal.  Beta-Blocker:start toprol 12.5mg  qHS; reported to be bradycardic previously, however, likely due to low pulses due to frequent PVC; today pulse rate of 45 with actual HR by EKG in 80s.  JXB:[id number]; will hold for now Cardiometabolic:farxiga 10mg , start today Devices therapies & Valvulopathies:will discuss with EP Advanced therapies:Not currently a candidate.   2. Frequent PVC - Now on amiodarone 200mg  BID - Recent ziopatch with 28% PVC burden - Will discuss with EP  3. 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 Will attempt to uptitrate to daily dosing.   4. Borderline DM -Most recent A1c 5.9% -Unable to afford semaglutide -Continue Farxiga   Alsie Younes Advanced Heart Failure Mechanical Circulatory Support

## 2022-08-08 ENCOUNTER — Telehealth (HOSPITAL_COMMUNITY): Payer: Self-pay | Admitting: Cardiology

## 2022-08-08 NOTE — Telephone Encounter (Signed)
Pts wife called with concerns after another bradycardic episode Pt reported not feeling well this morning (lightheaded and dizzy) Pts wife took b/p and HR  (b/p 118/70 HR 34 both palpable and with cuff)   HR returned to normal after glass of water and rechecking ~20 mins   Wife would like to know if brady events could be increased with metoprolol and what should she do if events continue?  -advised pt was noted brady in the past and PVC likely related

## 2022-08-13 LAB — LAB REPORT - SCANNED
A1c: 6.3
EGFR: 49.5

## 2022-08-18 ENCOUNTER — Other Ambulatory Visit (HOSPITAL_COMMUNITY): Payer: Self-pay | Admitting: Physician Assistant

## 2022-08-18 ENCOUNTER — Telehealth: Payer: Self-pay

## 2022-08-18 ENCOUNTER — Telehealth: Payer: Self-pay | Admitting: *Deleted

## 2022-08-18 NOTE — Telephone Encounter (Signed)
Pt is scheduled for a PVC ablation with Dr. Ladona Ridgel on 7/22 at 9:30 AM.   He is scheduled to see Dr. Ladona Ridgel in clinic on 7/15 and we will get labs done that day and go over instructions at that time.

## 2022-08-18 NOTE — Telephone Encounter (Signed)
-----   Message from Baird Lyons, RN sent at 08/12/2022  5:36 PM EDT -----  ----- Message ----- From: Regan Lemming, MD Sent: 08/07/2022   4:55 PM EDT To: Baird Lyons, RN; Dorthula Nettles, DO  Sounds good. Will get him in to be seen. ----- Message ----- From: Dorthula Nettles, DO Sent: 08/06/2022   4:52 PM EDT To: Rollene Rotunda, MD; Will Jorja Loa, MD  Hey, I saw Mr. Megan Salon today. He recently had another ziopatch placed with a PVC burden of 28%. What are your thoughts about moving forward with a PVC ablation? His EF is now severely reduced likely 2/2 PVCs unless upcoming CMR demonstrates some infiltrative process we were missing although I feel that will be unlikely.   Thanks,  [employer]

## 2022-08-18 NOTE — Telephone Encounter (Signed)
Spent 25 minutes on phone with pt. Discussed ablation recommendation from doctors Camnitz & Sabharwal. Pt understands Dr. Graciela Husbands does not do these, and pt would prefer to see someone who did. He does not want to wait months as he is symptomatic and miserable. He understands Dr. Ladona Ridgel is able to offer him the soonest date for ablation.  Pt would like to see him then. Dr. Elberta Fortis aware and ok for pt to transfer to Dr. Ladona Ridgel (pt aware he will probably come back to [facility] once Dr. Zenaida Niece, and is agreeable). Aware clinic & procedure scheduler will reach out to arrange appt and procedure. Patient verbalized understanding and agreeable to plan.

## 2022-08-18 NOTE — Telephone Encounter (Signed)
Pt is scheduled for a PVC Ablation with Dr. Ladona Ridgel on 7/22 at 9:30 AM.

## 2022-08-21 ENCOUNTER — Other Ambulatory Visit (HOSPITAL_COMMUNITY): Payer: Self-pay | Admitting: Physician Assistant

## 2022-09-08 ENCOUNTER — Ambulatory Visit: Payer: Medicare Other | Admitting: Internal Medicine

## 2022-09-15 ENCOUNTER — Encounter: Payer: Self-pay | Admitting: Internal Medicine

## 2022-09-15 ENCOUNTER — Ambulatory Visit: Payer: Medicare Other | Attending: Internal Medicine | Admitting: Internal Medicine

## 2022-09-15 VITALS — BP 116/62 | HR 86 | Ht 69.0 in | Wt 194.0 lb

## 2022-09-15 DIAGNOSIS — I428 Other cardiomyopathies: Secondary | ICD-10-CM | POA: Diagnosis not present

## 2022-09-15 NOTE — Progress Notes (Signed)
HPI [name] is referred for evaluation of PVC's. He is a pleasant 77 yo man with a h/o non-ischemic CM, who has worsening PVC's. He was tried on amiodarone but could not tolerate. He has an EF of 25%. His heart monitor demonstrated a 29% PVC burden, mostly monomorphic. He has not had syncope. He has class 2 CHF symptoms.  Allergies  Allergen Reactions   Erythrocin Diarrhea   Erythromycin Diarrhea   Lisinopril Cough   Nsaids Other (See Comments)    Gi bleeding Has tolerated limited, small amounts of Meloxicam   Oxycodone Other (See Comments)    Became a "wild person" and had total amnesia     Current Outpatient Medications  Medication Sig Dispense Refill   acetaminophen (TYLENOL) 500 MG tablet Take 500-1,000 mg by mouth every 6 (six) hours as needed for fever (for pain.).      B Complex Vitamins (VITAMIN B COMPLEX) TABS Take 1 tablet by mouth daily as needed (low energy).     Cholecalciferol (VITAMIN D-3) 1000 UNITS CAPS Take 1,000 Units by mouth daily.      colchicine 0.6 MG tablet Take 0.6 mg by mouth 2 (two) times daily as needed.     dapagliflozin propanediol (FARXIGA) 10 MG TABS tablet Take 1 tablet (10 mg total) by mouth daily. 30 tablet 11   diphenhydrAMINE (BENADRYL) 12.5 MG/5ML elixir Take 3.125 mg by mouth 4 (four) times daily as needed (for allergies.).     furosemide (LASIX) 20 MG tablet Take 1 tablet (20 mg total) by mouth as needed for edema (weight gain 3lbs in 1 day or 5lbs in 1 week). 30 tablet 0   metoprolol succinate (TOPROL XL) 25 MG 24 hr tablet Take 0.5 tablets (12.5 mg total) by mouth at bedtime. 90 tablet 3   Multiple Vitamins-Minerals (ICAPS AREDS 2 PO) Take 1 capsule by mouth 2 (two) times daily.     Probiotic Product (PROBIOTIC DAILY PO) Take 1 tablet by mouth daily.     rosuvastatin (CRESTOR) 10 MG tablet Take 1 tablet (10 mg total) by mouth 3 (three) times a week. 90 tablet 3   sacubitril-valsartan (ENTRESTO) 24-26 MG Take 1 tablet by mouth 2  (two) times daily. 60 tablet 11   amiodarone (PACERONE) 200 MG tablet Take 1 tablet by mouth twice daily for 2 weeks, then decrease to 1 tablet by mouth daily thereafter (Patient not taking: Reported on 09/15/2022) 45 tablet 0   No current facility-administered medications for this visit.     Past Medical History:  Diagnosis Date   Arthritis    Complication of anesthesia    doesn't wake up well,combative until wife talks with him   Diabetes mellitus without complication (HCC)    diet controlled    Esophageal reflux    Gout    hx of   H/O hiatal hernia    History of kidney stones    Hypertension    borderline HTNt   Plantar fasciitis    no problems now   PONV (postoperative nausea and vomiting)    Prostate cancer (HCC) 08/27/2012   gleason 6, volume 59.3 cc   PVC (premature ventricular contraction)    hx of    Skin cancer 2011   melanoma on back, resected    Undescended left testes     ROS:   All systems reviewed and negative except as noted in the HPI.   Past Surgical History:  Procedure Laterality Date   CYSTOSCOPY WITH  RETROGRADE PYELOGRAM, URETEROSCOPY AND STENT PLACEMENT Left 02/18/2016   Procedure: CYSTOSCOPY WITH LEFT RETROGRADE PYELOGRAM, URETEROSCOPY  AND URETERAL  STENT PLACEMENT;  Surgeon: Heloise Purpura, MD;  Location: [facility];  Service: Urology;  Laterality: Left;   HERNIA REPAIR     INGUINAL HERNIA REPAIR Bilateral 03/16/2015   Procedure: LAPAROSCOPIC REPAIR OF BILATERAL FEMEROL AND  BILATERAL INGUINAL HERNIAS WITH MESH;  Surgeon: Karie Soda, MD;  Location: [facility];  Service: General;  Laterality: Bilateral;   INSERTION OF MESH Right 03/16/2015   Procedure: INSERTION OF MESH;  Surgeon: Karie Soda, MD;  Location: [facility];  Service: General;  Laterality: Right;   KNEE ARTHROPLASTY  05/02/2011   Procedure: COMPUTER ASSISTED TOTAL KNEE ARTHROPLASTY;  Surgeon: Harvie Junior, MD;  Location: [facility];  Service: Orthopedics;  Laterality: Right;   KNEE ARTHROSCOPY      right   LAPAROSCOPIC LYSIS OF ADHESIONS  03/16/2015   Procedure: LAPAROSCOPIC LYSIS OF ADHESIONS;  Surgeon: Karie Soda, MD;  Location: [facility];  Service: General;;   MELANOMA EXCISION     stage 3+ lower left lumbar region with axillary node removal   MOHS SURGERY     mose surgery     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   PROSTATE BIOPSY  08/27/12   RIGHT/LEFT HEART CATH AND CORONARY ANGIOGRAPHY N/A 06/25/2022   Procedure: RIGHT/LEFT HEART CATH AND CORONARY ANGIOGRAPHY;  Surgeon: Marykay Lex, MD;  Location: [facility] INVASIVE CV [facility];  Service: Cardiovascular;  Laterality: N/A;   ROBOT ASSISTED LAPAROSCOPIC RADICAL PROSTATECTOMY N/A 11/04/2012   Procedure: ROBOTIC ASSISTED LAPAROSCOPIC RADICAL PROSTATECTOMY LEVEL 1;  Surgeon: Crecencio Mc, MD;  Location: [facility];  Service: Urology;  Laterality: N/A;   ROTATOR CUFF REPAIR     right   ROTATOR CUFF REPAIR     left   SHOULDER ARTHROSCOPY     left     Family History  Problem Relation Age of Onset   Congestive Heart Failure Mother    Diabetes Mother    Hypertension Mother    Diabetes Father    Hypertension Father    Stroke Father    Cancer Father        prostate, pancreas   Cancer Brother 4       prostate- xrt, seed implant   Anesthesia problems Neg Hx    Hypotension Neg Hx    Malignant hyperthermia Neg Hx    Pseudochol deficiency Neg Hx      Social History   Socioeconomic History   Marital status: Married    Spouse name: Arthur Klein   Number of children: 3   Years of education: Not on file   Highest education level: High school graduate  Occupational History   Occupation: Retired  Tobacco Use   Smoking status: Never   Smokeless tobacco: Current    Types: Chew   Tobacco comments:    Patient declines materials to quit chewing tobacco.  Vaping Use   Vaping status: Never Used  Substance and Sexual Activity   Alcohol use: No   Drug use: No   Sexual activity: Yes  Other Topics Concern   Not on file  Social History  Narrative   Lives with wife.   Three children.     Social Determinants of Health   Financial Resource Strain: Low Risk  (06/26/2022)   Overall Financial Resource Strain (CARDIA)    Difficulty of Paying Living Expenses: Not very hard  Food Insecurity: No Food Insecurity (06/24/2022)   Hunger Vital  Sign    Worried About Programme researcher, broadcasting/film/video in the Last Year: Never true    Ran Out of Food in the Last Year: Never true  Transportation Needs: No Transportation Needs (06/24/2022)   PRAPARE - Administrator, Civil Service (Medical): No    Lack of Transportation (Non-Medical): No  Physical Activity: Not on file  Stress: Not on file  Social Connections: Unknown (07/18/2021)   Received from [facility]   Social Network    Social Network: Not on file  Intimate Partner Violence: Not At Risk (06/24/2022)   Humiliation, Afraid, Rape, and Kick questionnaire    Fear of Current or Ex-Partner: No    Emotionally Abused: No    Physically Abused: No    Sexually Abused: No     BP 116/62   Pulse 86   Ht 5\' 9"  (1.753 m)   Wt 194 lb (88 kg)   SpO2 94%   BMI 28.65 kg/m   Physical Exam:  Well appearing NAD HEENT: Unremarkable Neck:  No JVD, no thyromegally Lymphatics:  No adenopathy Back:  No CVA tenderness Lungs:  Clear HEART:  Regular rate rhythm, no murmurs, no rubs, no clicks Abd:  soft, positive bowel sounds, no organomegally, no rebound, no guarding Ext:  2 plus pulses, no edema, no cyanosis, no clubbing Skin:  No rashes no nodules Neuro:  CN II through XII intact, motor grossly intact  EKG - nsr with bigeminal PVC'  Assess/Plan: Symptomatic PVC's - the patient is symptomatic and could not tolerated the amiodarone. He has stopped it. I have discussed the treatment options in detail. The risks/benefts/goals/expectations of PVC ablation were reviewed and he wishes to proceed.  Arthur Klein Arthur Tomaro,MD

## 2022-09-15 NOTE — Patient Instructions (Signed)
Medication Instructions:  No changes *If you need a refill on your cardiac medications before your next appointment, please call your pharmacy*   Lab Work: Today: cbc, bmet   Testing/Procedures: PVC Ablation as scheduled - see instruction letter   Follow-Up: As scheduled.

## 2022-09-15 NOTE — H&P (View-Only) (Signed)
HPI [name] is referred for evaluation of PVC's. He is a pleasant 77 yo man with a h/o non-ischemic CM, who has worsening PVC's. He was tried on amiodarone but could not tolerate. He has an EF of 25%. His heart monitor demonstrated a 29% PVC burden, mostly monomorphic. He has not had syncope. He has class 2 CHF symptoms.  Allergies  Allergen Reactions   Erythrocin Diarrhea   Erythromycin Diarrhea   Lisinopril Cough   Nsaids Other (See Comments)    Gi bleeding Has tolerated limited, small amounts of Meloxicam   Oxycodone Other (See Comments)    Became a "wild person" and had total amnesia     Current Outpatient Medications  Medication Sig Dispense Refill   acetaminophen (TYLENOL) 500 MG tablet Take 500-1,000 mg by mouth every 6 (six) hours as needed for fever (for pain.).      B Complex Vitamins (VITAMIN B COMPLEX) TABS Take 1 tablet by mouth daily as needed (low energy).     Cholecalciferol (VITAMIN D-3) 1000 UNITS CAPS Take 1,000 Units by mouth daily.      colchicine 0.6 MG tablet Take 0.6 mg by mouth 2 (two) times daily as needed.     dapagliflozin propanediol (FARXIGA) 10 MG TABS tablet Take 1 tablet (10 mg total) by mouth daily. 30 tablet 11   diphenhydrAMINE (BENADRYL) 12.5 MG/5ML elixir Take 3.125 mg by mouth 4 (four) times daily as needed (for allergies.).     furosemide (LASIX) 20 MG tablet Take 1 tablet (20 mg total) by mouth as needed for edema (weight gain 3lbs in 1 day or 5lbs in 1 week). 30 tablet 0   metoprolol succinate (TOPROL XL) 25 MG 24 hr tablet Take 0.5 tablets (12.5 mg total) by mouth at bedtime. 90 tablet 3   Multiple Vitamins-Minerals (ICAPS AREDS 2 PO) Take 1 capsule by mouth 2 (two) times daily.     Probiotic Product (PROBIOTIC DAILY PO) Take 1 tablet by mouth daily.     rosuvastatin (CRESTOR) 10 MG tablet Take 1 tablet (10 mg total) by mouth 3 (three) times a week. 90 tablet 3   sacubitril-valsartan (ENTRESTO) 24-26 MG Take 1 tablet by mouth 2  (two) times daily. 60 tablet 11   amiodarone (PACERONE) 200 MG tablet Take 1 tablet by mouth twice daily for 2 weeks, then decrease to 1 tablet by mouth daily thereafter (Patient not taking: Reported on 09/15/2022) 45 tablet 0   No current facility-administered medications for this visit.     Past Medical History:  Diagnosis Date   Arthritis    Complication of anesthesia    doesn't wake up well,combative until wife talks with him   Diabetes mellitus without complication (HCC)    diet controlled    Esophageal reflux    Gout    hx of   H/O hiatal hernia    History of kidney stones    Hypertension    borderline HTNt   Plantar fasciitis    no problems now   PONV (postoperative nausea and vomiting)    Prostate cancer (HCC) 08/27/2012   gleason 6, volume 59.3 cc   PVC (premature ventricular contraction)    hx of    Skin cancer 2011   melanoma on back, resected    Undescended left testes     ROS:   All systems reviewed and negative except as noted in the HPI.   Past Surgical History:  Procedure Laterality Date   CYSTOSCOPY WITH  RETROGRADE PYELOGRAM, URETEROSCOPY AND STENT PLACEMENT Left 02/18/2016   Procedure: CYSTOSCOPY WITH LEFT RETROGRADE PYELOGRAM, URETEROSCOPY  AND URETERAL  STENT PLACEMENT;  Surgeon: Heloise Purpura, MD;  Location: [facility];  Service: Urology;  Laterality: Left;   HERNIA REPAIR     INGUINAL HERNIA REPAIR Bilateral 03/16/2015   Procedure: LAPAROSCOPIC REPAIR OF BILATERAL FEMEROL AND  BILATERAL INGUINAL HERNIAS WITH MESH;  Surgeon: Karie Soda, MD;  Location: [facility];  Service: General;  Laterality: Bilateral;   INSERTION OF MESH Right 03/16/2015   Procedure: INSERTION OF MESH;  Surgeon: Karie Soda, MD;  Location: [facility];  Service: General;  Laterality: Right;   KNEE ARTHROPLASTY  05/02/2011   Procedure: COMPUTER ASSISTED TOTAL KNEE ARTHROPLASTY;  Surgeon: Harvie Junior, MD;  Location: [facility];  Service: Orthopedics;  Laterality: Right;   KNEE ARTHROSCOPY      right   LAPAROSCOPIC LYSIS OF ADHESIONS  03/16/2015   Procedure: LAPAROSCOPIC LYSIS OF ADHESIONS;  Surgeon: Karie Soda, MD;  Location: [facility];  Service: General;;   MELANOMA EXCISION     stage 3+ lower left lumbar region with axillary node removal   MOHS SURGERY     mose surgery     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   PROSTATE BIOPSY  08/27/12   RIGHT/LEFT HEART CATH AND CORONARY ANGIOGRAPHY N/A 06/25/2022   Procedure: RIGHT/LEFT HEART CATH AND CORONARY ANGIOGRAPHY;  Surgeon: Marykay Lex, MD;  Location: [facility] IN[facility]IVE CV [facility];  Service: Cardiovascular;  Laterality: N/A;   ROBOT ASSISTED LAPAROSCOPIC RADICAL PROSTATECTOMY N/A 11/04/2012   Procedure: ROBOTIC ASSISTED LAPAROSCOPIC RADICAL PROSTATECTOMY LEVEL 1;  Surgeon: Crecencio Mc, MD;  Location: [facility];  Service: Urology;  Laterality: N/A;   ROTATOR CUFF REPAIR     right   ROTATOR CUFF REPAIR     left   SHOULDER ARTHROSCOPY     left     Family History  Problem Relation Age of Onset   Congestive Heart Failure Mother    Diabetes Mother    Hypertension Mother    Diabetes Father    Hypertension Father    Stroke Father    Cancer Father        prostate, pancreas   Cancer Brother 4       prostate- xrt, seed implant   Anesthesia problems Neg Hx    Hypotension Neg Hx    Malignant hyperthermia Neg Hx    Pseudochol deficiency Neg Hx      Social History   Socioeconomic History   Marital status: Married    Spouse name: Debbie   Number of children: 3   Years of education: Not on file   Highest education level: High school graduate  Occupational History   Occupation: Retired  Tobacco Use   Smoking status: Never   Smokeless tobacco: Current    Types: Chew   Tobacco comments:    Patient declines materials to quit chewing tobacco.  Vaping Use   Vaping status: Never Used  Substance and Sexual Activity   Alcohol use: No   Drug use: No   Sexual activity: Yes  Other Topics Concern   Not on file  Social History  Narrative   Lives with wife.   Three children.     Social Determinants of Health   Financial Resource Strain: Low Risk  (06/26/2022)   Overall Financial Resource Strain (CARDIA)    Difficulty of Paying Living Expenses: Not very hard  Food Insecurity: No Food Insecurity (06/24/2022)   Hunger Vital  Sign    Worried About Programme researcher, broadcasting/film/video in the Last Year: Never true    Ran Out of Food in the Last Year: Never true  Transportation Needs: No Transportation Needs (06/24/2022)   PRAPARE - Administrator, Civil Service (Medical): No    Lack of Transportation (Non-Medical): No  Physical Activity: Not on file  Stress: Not on file  Social Connections: Unknown (07/18/2021)   Received from [facility]   Social Network    Social Network: Not on file  Intimate Partner Violence: Not At Risk (06/24/2022)   Humiliation, Afraid, Rape, and Kick questionnaire    Fear of Current or Ex-Partner: No    Emotionally Abused: No    Physically Abused: No    Sexually Abused: No     BP 116/62   Pulse 86   Ht 5\' 9"  (1.753 m)   Wt 194 lb (88 kg)   SpO2 94%   BMI 28.65 kg/m   Physical Exam:  Well appearing NAD HEENT: Unremarkable Neck:  No JVD, no thyromegally Lymphatics:  No adenopathy Back:  No CVA tenderness Lungs:  Clear HEART:  Regular rate rhythm, no murmurs, no rubs, no clicks Abd:  soft, positive bowel sounds, no organomegally, no rebound, no guarding Ext:  2 plus pulses, no edema, no cyanosis, no clubbing Skin:  No rashes no nodules Neuro:  CN II through XII intact, motor grossly intact  EKG - nsr with bigeminal PVC'  Assess/Plan: Symptomatic PVC's - the patient is symptomatic and could not tolerated the amiodarone. He has stopped it. I have discussed the treatment options in detail. The risks/benefts/goals/expectations of PVC ablation were reviewed and he wishes to proceed.  Sharlot Gowda Taylor,MD

## 2022-09-16 LAB — BASIC METABOLIC PANEL
BUN/Creatinine Ratio: 14 (ref 10–24)
BUN: 18 mg/dL (ref 8–27)
CO2: 19 mmol/L — ABNORMAL LOW (ref 20–29)
Calcium: 9.6 mg/dL (ref 8.6–10.2)
Chloride: 99 mmol/L (ref 96–106)
Creatinine, Ser: 1.25 mg/dL (ref 0.76–1.27)
Glucose: 89 mg/dL (ref 70–99)
Potassium: 4.8 mmol/L (ref 3.5–5.2)
Sodium: 139 mmol/L (ref 134–144)
eGFR: 60 mL/min/{1.73_m2} (ref >59)

## 2022-09-16 LAB — CBC
Hematocrit: 44.1 % (ref 37.5–51.0)
Hemoglobin: 14.6 g/dL (ref 13.0–17.7)
MCH: 29.4 pg (ref 26.6–33.0)
MCHC: 33.1 g/dL (ref 31.5–35.7)
MCV: 89 fL (ref 79–97)
Platelets: 179 10*3/uL (ref 150–450)
RBC: 4.97 x10E6/uL (ref 4.14–5.80)
RDW: 14.4 % (ref 11.6–15.4)
WBC: 10 10*3/uL (ref 3.4–10.8)

## 2022-09-21 NOTE — Pre-Procedure Instructions (Signed)
Instructed patient on the following items: Arrival time 0730 Nothing to eat or drink after midnight No meds AM of procedure Responsible person to drive you home and stay with you for 24 hrs  Last dose Metoprolol- Saturday 7/20.  Last dose Farixga- Thursday 7/18.

## 2022-09-22 ENCOUNTER — Ambulatory Visit (HOSPITAL_COMMUNITY)
Admission: RE | Admit: 2022-09-22 | Discharge: 2022-09-22 | Disposition: A | Discharge status: Home / Self Care | Payer: Medicare Other | Attending: Internal Medicine | Admitting: Internal Medicine

## 2022-09-22 ENCOUNTER — Other Ambulatory Visit: Payer: Self-pay

## 2022-09-22 ENCOUNTER — Encounter (HOSPITAL_COMMUNITY)
Admission: RE | Disposition: A | Discharge status: Home / Self Care | Payer: Self-pay | Source: Home / Self Care | Attending: Internal Medicine

## 2022-09-22 DIAGNOSIS — I493 Ventricular premature depolarization: Secondary | ICD-10-CM | POA: Diagnosis present

## 2022-09-22 DIAGNOSIS — I428 Other cardiomyopathies: Secondary | ICD-10-CM | POA: Insufficient documentation

## 2022-09-22 HISTORY — PX: PVC ABLATION: EP1236

## 2022-09-22 LAB — POCT ACTIVATED CLOTTING TIME
Activated Clotting Time: 207 seconds
Activated Clotting Time: 244 seconds
Activated Clotting Time: 177 seconds

## 2022-09-22 LAB — GLUCOSE, CAPILLARY
Glucose-Capillary: 114 mg/dL — ABNORMAL HIGH (ref 70–99)
Glucose-Capillary: 94 mg/dL (ref 70–99)
Glucose-Capillary: 180 mg/dL — ABNORMAL HIGH (ref 70–99)

## 2022-09-22 SURGERY — PVC ABLATION
Anesthesia: LOCAL

## 2022-09-22 MED ORDER — BUPIVACAINE HCL (PF) 0.25 % IJ SOLN
INTRAMUSCULAR | Status: AC
  Filled 2022-09-22: qty 30

## 2022-09-22 MED ORDER — SODIUM CHLORIDE 0.9% FLUSH: 3.0000 mL | Freq: Two times a day (BID) | INTRAVENOUS | Status: DC

## 2022-09-22 MED ORDER — SODIUM CHLORIDE 0.9 % IV SOLN: INTRAVENOUS | Status: DC

## 2022-09-22 MED ORDER — HEPARIN SODIUM (PORCINE) 1000 UNIT/ML IJ SOLN
INTRAMUSCULAR | Status: DC | PRN
  Administered 2022-09-22: 1000 [IU] via INTRAVENOUS
  Administered 2022-09-22: 6000 [IU] via INTRAVENOUS
  Administered 2022-09-22: 2000 [IU] via INTRAVENOUS

## 2022-09-22 MED ORDER — HEPARIN (PORCINE) IN NACL 1000-0.9 UT/500ML-% IV SOLN
INTRAVENOUS | Status: DC | PRN
  Administered 2022-09-22 (×2): 500 mL

## 2022-09-22 MED ORDER — MIDAZOLAM HCL 5 MG/5ML IJ SOLN
INTRAMUSCULAR | Status: DC | PRN
  Administered 2022-09-22 (×4): 1 mg via INTRAVENOUS

## 2022-09-22 MED ORDER — SODIUM CHLORIDE 0.9 % IV SOLN: 250.0000 mL | INTRAVENOUS | Status: DC | PRN

## 2022-09-22 MED ORDER — SODIUM CHLORIDE 0.9% FLUSH: 3.0000 mL | INTRAVENOUS | Status: DC | PRN

## 2022-09-22 MED ORDER — MIDAZOLAM HCL 5 MG/5ML IJ SOLN
INTRAMUSCULAR | Status: AC
  Filled 2022-09-22: qty 5

## 2022-09-22 MED ORDER — HEPARIN SODIUM (PORCINE) 1000 UNIT/ML IJ SOLN
INTRAMUSCULAR | Status: AC
  Filled 2022-09-22: qty 10

## 2022-09-22 MED ORDER — ACETAMINOPHEN 325 MG PO TABS
650.0000 mg | ORAL_TABLET | ORAL | Status: DC | PRN
  Administered 2022-09-22: 650 mg via ORAL
  Filled 2022-09-22: qty 2

## 2022-09-22 MED ORDER — ONDANSETRON HCL 4 MG/2ML IJ SOLN: 4.0000 mg | Freq: Four times a day (QID) | INTRAMUSCULAR | Status: DC | PRN

## 2022-09-22 MED ORDER — FENTANYL CITRATE (PF) 100 MCG/2ML IJ SOLN
INTRAMUSCULAR | Status: DC | PRN
  Administered 2022-09-22 (×4): 12.5 ug via INTRAVENOUS

## 2022-09-22 MED ORDER — FENTANYL CITRATE (PF) 100 MCG/2ML IJ SOLN
INTRAMUSCULAR | Status: AC
  Filled 2022-09-22: qty 2

## 2022-09-22 MED ORDER — BUPIVACAINE HCL (PF) 0.25 % IJ SOLN
INTRAMUSCULAR | Status: DC | PRN
  Administered 2022-09-22: 60 mL

## 2022-09-22 SURGICAL SUPPLY — 12 items
BAG SNAP BAND KOVER 36X36 (MISCELLANEOUS) IMPLANT
CATH JOSEPHSON QUAD-ALLRED 6FR (CATHETERS) IMPLANT
CATH SMTCH THERMOCOOL SF DF (CATHETERS) IMPLANT
CATH WEBSTER BI DIR CS D-F CRV (CATHETERS) IMPLANT
PACK EP LATEX FREE (CUSTOM PROCEDURE TRAY) ×1
PACK EP LF (CUSTOM PROCEDURE TRAY) ×1 IMPLANT
PAD DEFIB RADIO PHYSIO CONN (PAD) ×1 IMPLANT
PATCH CARTO3 (PAD) IMPLANT
SHEATH PINNACLE 6F 10CM (SHEATH) IMPLANT
SHEATH PINNACLE 7F 10CM (SHEATH) IMPLANT
SHEATH PINNACLE 8F 10CM (SHEATH) IMPLANT
TUBING SMART ABLATE COOLFLOW (TUBING) IMPLANT

## 2022-09-22 NOTE — Interval H&P Note (Signed)
History and Physical Interval Note:  09/22/2022 9:23 AM  Arthur Klein  has presented today for surgery, with the diagnosis of PVC's.  The various methods of treatment have been discussed with the patient and family. After consideration of risks, benefits and other options for treatment, the patient has consented to  Procedure(s): PVC ABLATION (N/A) as a surgical intervention.  The patient's history has been reviewed, patient examined, no change in status, stable for surgery.  I have reviewed the patient's chart and labs.  Questions were answered to the patient's satisfaction.     Lewayne Bunting

## 2022-09-22 NOTE — Progress Notes (Signed)
1337 2 venous sheaths 7 and 75fr removed without difficulty after aspiration and held for 10 minutes.  No hematoma noted.  Manual pressure continued to be held over venous site during arterial sheath pull. 1347 54fr arterial sheath removed after aspiration.  Manual pressure held for 30 more minutes.  Total of 40 minutes manual pressure held over both sites. Slight bruising noted.  No hematoma noted gauze dressing applied. Pulses noted by doppler.  Feet warm to touch.  Pt. Tolerated well.    Bedrest started at 1430

## 2022-09-22 NOTE — Discharge Instructions (Addendum)

## 2022-09-22 NOTE — Progress Notes (Signed)
Report and care received from Lynann Beaver. Patient resting comfortably. Rt groin has an 53F sheath in the artery and a 6 and 32F in the vein. Rt groin site soft with no active bleeding or hematoma noted. VSS. Will continue to monitor patient.

## 2022-09-23 ENCOUNTER — Encounter (HOSPITAL_COMMUNITY): Payer: Self-pay | Admitting: Internal Medicine

## 2022-09-23 ENCOUNTER — Telehealth: Payer: Self-pay | Admitting: Internal Medicine

## 2022-09-23 NOTE — Telephone Encounter (Signed)
Pt's spouse is requesting a callback to discuss what medications he should start back taking since they weren't able to do the ablation. Please advise

## 2022-09-23 NOTE — Telephone Encounter (Signed)
Reviewed discharge instructions, after-care instructions, and medication list with patient's wife Gavin Pound.  She states dressing from yesterday is still in place, with no active bleeding seen. She will remove dressing tomorrow.  She would like to know if patient still needs to have cardiac MRI performed on 10/01/22. This was ordered by Dr. Antoine Poche (per OV note from 07/14/22: I am going to be ordering an MRI to look for any infiltrative disease though I have a low suspicion of this.)  Will forward to Dr. Antoine Poche and Dr. Ladona Ridgel to see if they wish for patient to proceed with cardiac MRI.

## 2022-09-26 NOTE — Telephone Encounter (Signed)
Left message for patient letting him know that we are waiting to hear from Dr. Antoine Poche but to keep appointment as scheduled for now. We will call him back if it is not needed.

## 2022-09-26 NOTE — Telephone Encounter (Signed)
Patient is calling to follow up on his appointment on 07/31. Please advise.

## 2022-09-26 NOTE — Telephone Encounter (Signed)
Called and spoke with patient. Shared Dr. Jenene Slicker response:  Yes this was ordered by Dr. Gasper Lloyd to evaluate for possible infiltrative cause of his cardiomyopathy.   Patient verbalized understanding and expressed appreciation for follow-up.

## 2022-09-30 ENCOUNTER — Encounter (HOSPITAL_COMMUNITY): Payer: Self-pay

## 2022-09-30 ENCOUNTER — Telehealth (HOSPITAL_COMMUNITY): Payer: Self-pay | Admitting: Emergency Medicine

## 2022-09-30 NOTE — Telephone Encounter (Signed)
Reaching out to patient to offer assistance regarding upcoming cardiac imaging study; pt verbalizes understanding of appt date/time, parking situation and where to check in, pre-test NPO status and medications ordered, and verified current allergies; name and call back number provided for further questions should they arise Sara Wallace RN Navigator Cardiac Imaging [facility] [phone] office [phone] cell 

## 2022-10-01 ENCOUNTER — Other Ambulatory Visit: Payer: Self-pay | Admitting: Cardiology

## 2022-10-01 ENCOUNTER — Ambulatory Visit (HOSPITAL_COMMUNITY)
Admission: RE | Admit: 2022-10-01 | Discharge: 2022-10-01 | Disposition: A | Discharge status: Home / Self Care | Payer: Medicare Other | Source: Ambulatory Visit | Attending: Cardiology | Admitting: Cardiology

## 2022-10-01 DIAGNOSIS — E118 Type 2 diabetes mellitus with unspecified complications: Secondary | ICD-10-CM | POA: Diagnosis present

## 2022-10-01 DIAGNOSIS — I493 Ventricular premature depolarization: Secondary | ICD-10-CM | POA: Diagnosis not present

## 2022-10-01 DIAGNOSIS — I5021 Acute systolic (congestive) heart failure: Secondary | ICD-10-CM

## 2022-10-01 DIAGNOSIS — R0989 Other specified symptoms and signs involving the circulatory and respiratory systems: Secondary | ICD-10-CM

## 2022-10-01 MED ORDER — GADOBUTROL 1 MMOL/ML IV SOLN
10.0000 mL | Freq: Once | INTRAVENOUS | Status: AC | PRN
  Administered 2022-10-01: 10 mL via INTRAVENOUS

## 2022-10-07 ENCOUNTER — Ambulatory Visit (HOSPITAL_COMMUNITY)
Admission: RE | Admit: 2022-10-07 | Discharge: 2022-10-07 | Disposition: A | Discharge status: Home / Self Care | Payer: Medicare Other | Source: Ambulatory Visit | Attending: Cardiology | Admitting: Cardiology

## 2022-10-07 ENCOUNTER — Encounter (HOSPITAL_COMMUNITY): Payer: Self-pay | Admitting: Cardiology

## 2022-10-07 VITALS — BP 122/62 | HR 41 | Wt 193.2 lb

## 2022-10-07 DIAGNOSIS — I2510 Atherosclerotic heart disease of native coronary artery without angina pectoris: Secondary | ICD-10-CM | POA: Diagnosis not present

## 2022-10-07 DIAGNOSIS — E119 Type 2 diabetes mellitus without complications: Secondary | ICD-10-CM | POA: Insufficient documentation

## 2022-10-07 DIAGNOSIS — I110 Hypertensive heart disease with heart failure: Secondary | ICD-10-CM | POA: Insufficient documentation

## 2022-10-07 DIAGNOSIS — I493 Ventricular premature depolarization: Secondary | ICD-10-CM | POA: Diagnosis not present

## 2022-10-07 DIAGNOSIS — I509 Heart failure, unspecified: Secondary | ICD-10-CM | POA: Insufficient documentation

## 2022-10-07 DIAGNOSIS — Z79899 Other long term (current) drug therapy: Secondary | ICD-10-CM | POA: Diagnosis not present

## 2022-10-07 DIAGNOSIS — I5022 Chronic systolic (congestive) heart failure: Secondary | ICD-10-CM

## 2022-10-07 DIAGNOSIS — E875 Hyperkalemia: Secondary | ICD-10-CM | POA: Diagnosis not present

## 2022-10-07 DIAGNOSIS — I428 Other cardiomyopathies: Secondary | ICD-10-CM | POA: Diagnosis not present

## 2022-10-07 LAB — BASIC METABOLIC PANEL
Anion gap: 10 (ref 5–15)
BUN: 15 mg/dL (ref 8–23)
CO2: 24 mmol/L (ref 22–32)
Calcium: 9.5 mg/dL (ref 8.9–10.3)
Chloride: 101 mmol/L (ref 98–111)
Creatinine, Ser: 1.19 mg/dL (ref 0.61–1.24)
GFR, Estimated: >60 mL/min (ref >60)
Glucose, Bld: 115 mg/dL — ABNORMAL HIGH (ref 70–99)
Potassium: 4.3 mmol/L (ref 3.5–5.1)
Sodium: 135 mmol/L (ref 135–145)

## 2022-10-07 LAB — CBC
HCT: 43.7 % (ref 39.0–52.0)
Hemoglobin: 14.1 g/dL (ref 13.0–17.0)
MCH: 28.1 pg (ref 26.0–34.0)
MCHC: 32.3 g/dL (ref 30.0–36.0)
MCV: 87.1 fL (ref 80.0–100.0)
Platelets: 188 10*3/uL (ref 150–400)
RBC: 5.02 MIL/uL (ref 4.22–5.81)
RDW: 14.6 % (ref 11.5–15.5)
WBC: 11.1 10*3/uL — ABNORMAL HIGH (ref 4.0–10.5)
nRBC: 0 % (ref 0.0–0.2)

## 2022-10-07 LAB — BRAIN NATRIURETIC PEPTIDE: B Natriuretic Peptide: 181.9 pg/mL — ABNORMAL HIGH (ref 0.0–100.0)

## 2022-10-07 MED ORDER — ENTRESTO 49-51 MG PO TABS: 1.0000 | ORAL_TABLET | Freq: Two times a day (BID) | ORAL | 11 refills | Status: DC

## 2022-10-07 NOTE — Progress Notes (Signed)
[facility] CLIN[facility] NOTE  Referring Physician: Alysia Penna, MD  Primary Care: Alysia Penna, MD Primary Cardiologist: Dr. Antoine Poche EP: Dr. Elberta Fortis HF: Arthur Klein  HPI: Arthur Klein is a 77 y.o. 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  His cardiac history dates back to at least 2022 with echocardiogram demonstrating EF of 40 to 45% at that time.  In early 2023 he wore a Zio patch with 28% PVC burden; started on flecainide with improvement in PVC burden to 4.9% on repeat monitor in October 2023.  He was admitted in April 2024 with acute on chronic heart failure exacerbation; echocardiogram at that time with a EF of 20 to 25%.  Right and left heart catheterization with nonobstructive CAD and cardiac index of 2.47. He was diuresed and started on GDMT. He had a repeat ziopatch from  07/16/22-07/23/22 w/ a 28% PVC burden. Unsuccessful ablation in 7/24 in  Now he is on amiodarone 200mg  BID and present for follow up.   Interval hx:  Feels better from a functional standpoint; LVEF remains severely reduced. He gets fatigued after moderate exertion. No lightheadedness, LE edema or CP.   Activity level/exercise tolerance:  NYHA IIB-III; difficult to assess.  Orthopnea:  Sleeps on 2 pillows Paroxysmal noctural dyspnea:  No Chest pain/pressure:  No Orthostatic lightheadedness:  No Palpitations:  minimal Lower extremity edema: trace pretibial edema Presyncope/syncope:  No Cough:  No  Past Medical History:  Diagnosis Date   Arthritis    Complication of anesthesia    doesn't wake up well,combative until wife talks with him   Diabetes mellitus without complication (HCC)    diet controlled    Esophageal reflux    Gout    hx of   H/O hiatal hernia    History of kidney stones    Hypertension    borderline HTNt   Plantar fasciitis    no problems now   PONV (postoperative nausea and vomiting)     Prostate cancer (HCC) 08/27/2012   gleason 6, volume 59.3 cc   PVC (premature ventricular contraction)    hx of    Skin cancer 2011   melanoma on back, resected    Undescended left testes     Current Outpatient Medications  Medication Sig Dispense Refill   acetaminophen (TYLENOL) 500 MG tablet Take 500-1,000 mg by mouth every 6 (six) hours as needed for fever (for pain.).      amiodarone (PACERONE) 200 MG tablet Take 1 tablet by mouth twice daily for 2 weeks, then decrease to 1 tablet by mouth daily thereafter 45 tablet 0   Cholecalciferol (VITAMIN D-3) 1000 UNITS CAPS Take 1,000 Units by mouth daily.      colchicine 0.6 MG tablet Take 0.6 mg by mouth 2 (two) times daily as needed.     dapagliflozin propanediol (FARXIGA) 10 MG TABS tablet Take 1 tablet (10 mg total) by mouth daily. 30 tablet 11   diphenhydrAMINE (BENADRYL) 12.5 MG/5ML elixir Take 3.125 mg by mouth 4 (four) times daily as needed (for allergies.).     furosemide (LASIX) 20 MG tablet Take 1 tablet (20 mg total) by mouth as needed for edema (weight gain 3lbs in 1 day or 5lbs in 1 week). 30 tablet 0   metoprolol succinate (TOPROL XL) 25 MG 24 hr tablet Take 0.5 tablets (12.5 mg total) by mouth at bedtime. 90 tablet 3   Multiple Vitamins-Minerals (ICAPS AREDS 2 PO) Take  1 capsule by mouth daily.     Nutritional Supplements (JUICE PLUS FIBRE PO) Take 2 tablets by mouth daily. Vegetable Fruit     PRESCRIPTION MEDICATION Take 2,000 mg by mouth once. Penicillin 500 mg each     Probiotic Product (PROBIOTIC DAILY PO) Take 1 tablet by mouth every morning.     sacubitril-valsartan (ENTRESTO) 24-26 MG Take 1 tablet by mouth 2 (two) times daily. 60 tablet 11   B Complex Vitamins (VITAMIN B COMPLEX) TABS Take 1 tablet by mouth daily.     No current facility-administered medications for this encounter.    Allergies  Allergen Reactions   Erythrocin Diarrhea   Erythromycin Diarrhea   Lisinopril Cough   Nsaids Other (See Comments)     Gi bleeding Has tolerated limited, small amounts of Meloxicam   Oxycodone Other (See Comments)    Became a "wild person" and had total amnesia   Ozempic (0.25 Or 0.5 Mg-Dose) [Semaglutide(0.25 Or 0.5mg -Dos)] Diarrhea      Social History   Socioeconomic History   Marital status: Married    Spouse name: Debbie   Number of children: 3   Years of education: Not on file   Highest education level: High school graduate  Occupational History   Occupation: Retired  Tobacco Use   Smoking status: Never   Smokeless tobacco: Current    Types: Chew   Tobacco comments:    Patient declines materials to quit chewing tobacco.  Vaping Use   Vaping status: Never Used  Substance and Sexual Activity   Alcohol use: No   Drug use: No   Sexual activity: Yes  Other Topics Concern   Not on file  Social History Narrative   Lives with wife.   Three children.     Social Determinants of Health   Financial Resource Strain: Low Risk  (06/26/2022)   Overall Financial Resource Strain (CARDIA)    Difficulty of Paying Living Expenses: Not very hard  Food Insecurity: No Food Insecurity (06/24/2022)   Hunger Vital Sign    Worried About Running Out of Food in the Last Year: Never true    Ran Out of Food in the Last Year: Never true  Transportation Needs: No Transportation Needs (06/24/2022)   PRAPARE - Administrator, Civil Service (Medical): No    Lack of Transportation (Non-Medical): No  Physical Activity: Not on file  Stress: Not on file  Social Connections: Unknown (07/18/2021)   Received from [facility]   Social Network    Social Network: Not on file  Intimate Partner Violence: Not At Risk (06/24/2022)   Humiliation, Afraid, Rape, and Kick questionnaire    Fear of Current or Ex-Partner: No    Emotionally Abused: No    Physically Abused: No    Sexually Abused: No      Family History  Problem Relation Age of Onset   Congestive Heart Failure Mother    Diabetes Mother     Hypertension Mother    Diabetes Father    Hypertension Father    Stroke Father    Cancer Father        prostate, pancreas   Cancer Brother 2       prostate- xrt, seed implant   Anesthesia problems Neg Hx    Hypotension Neg Hx    Malignant hyperthermia Neg Hx    Pseudochol deficiency Neg Hx     PHYSICAL EXAM: Vitals:   10/07/22 0926  BP: 122/62  Pulse: (!) 41  SpO2: 95%   GENERAL: Well nourished, well developed, and in no apparent distress at rest.  HEENT: Negative for arcus senilis or xanthelasma. There is no scleral icterus.  The mucous membranes are pink and moist.   NECK: Supple, No masses. Normal carotid upstrokes without bruits. No masses or thyromegaly.    CHEST: There are no chest wall deformities. There is no chest wall tenderness. Respirations are unlabored.  Lungs- CTA B/L CARDIAC:  JVP: 7 cm          Normal rate with regular rhythm. No murmurs, rubs or gallops.  Pulses are 2+ and symmetrical in upper and lower extremities. No edema.  ABDOMEN: Soft, non-tender, non-distended. There are no masses or hepatomegaly. There are normal bowel sounds.  EXTREMITIES: Warm and well perfused with no cyanosis, clubbing.  LYMPHATIC: No axillary or supraclavicular lymphadenopathy.  NEUROLOGIC: Patient is oriented x3 with no focal or lateralizing neurologic deficits.  PSYCH: Patients affect is appropriate, there is no evidence of anxiety or depression.  SKIN: Warm and dry; no lesions or wounds.    DATA REVIEW  ECG: 08/06/22: NSR with frequent PVCs  ECHO: 06/24/22: LVEF 20-25%, normal RV function as per my interpretation  CATH: 06/25/22: Diffuse ectasia of the RCA and LCx Moderate to severe concentric calcification in the proximal to mid LAD with mild to moderate concentric calcification in the proximal to mid LCx and RCA Most notable lesion is a 50% proximal RCA lesion and a bend between ectatic segments, otherwise moderate disease in OM1 and D2.  No flow-limiting  lesions. Normal right heart cath pressures with LV EDP and PCWP of 6 to 9 mmHg.  Mean PAP 15 mmHg. Cardiac Output and Index: 5.01-2.47  CMR: 1. Severe LVE with global hypokinesis LVEF 28%  2. Diffuse mid myocardial gadolinium uptake at base with small area of scar in the most basal anterior septum under the aortic annulus  3.  Mild decrease in RV function RVEF 37%  4. 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 Normal T2 suggests no active inflammation or myocarditis  4.  Preserved cardiac output 4.2 L/min  5.  Mild appearing MR  6.  Moderate LAE   ASSESSMENT & PLAN:  Heart failure with reduced EF Etiology of ZO:[id number] cardiomyopathy; LHC/RHC above. 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 Will obtain PET-CT to evaluate for cardiac sarcoid. 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  NYHA class / AHA Stage:NYHA IIB-III Volume status & Diuretics: Euvolemic  Vasodilators: Increase entresto to 49/51mg  BID Beta-Blocker:toprol 12.5mg  qHS; reported to be bradycardic previously, however, likely due to low pulses due to frequent PVC; today pulse rate of 45 with actual HR by EKG in 80s.  JXB:[id number]; will hold for now Cardiometabolic: continue farxiga 10mg  daily.  Devices therapies & Valvulopathies:will discuss with EP Advanced therapies:Not currently a candidate.   2. Frequent PVC - Now on amiodarone 200mg  BID - Recent ziopatch with 28% PVC burden - S/P PVC ablation on 09/22/22 unsuccessful.   3. 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 Will attempt to uptitrate to daily dosing.   4. Borderline DM -Most recent A1c 5.9% -Unable to afford semaglutide -Continue Farxiga   Ercell Razon [facility] Mechanical Circulatory  Support

## 2022-10-07 NOTE — Patient Instructions (Signed)
EKG done today.  Labs done today. We will contact you only if your labs are abnormal.  INCREASE Entresto 49-51mg  (1 tablet) by mouth 2 times daily.  No other medication changes were made. Please continue all current medications as prescribed.  Your physician has recommended that you have a cardiopulmonary stress test (CPX). CPX testing is a non-invasive measurement of heart and lung function. It replaces a traditional treadmill stress test. This type of test provides a tremendous amount of information that relates not only to your present condition but also for future outcomes. This test combines measurements of you ventilation, respiratory gas exchange in the lungs, electrocardiogram (EKG), blood pressure and physical response before, during, and following an exercise protocol.  Your physician recommends that you schedule a follow-up appointment soon for a CPX test and in 2 months with Dr. Gasper Lloyd. Please contact our office in September to schedule a October appointment.   If you have any questions or concerns before your next appointment please send [place] a message through [place] or call our office at [phone].    TO LEAVE A MESSAGE FOR THE NURSE SELECT OPTION 2, PLEASE LEAVE A MESSAGE INCLUDING: YOUR NAME DATE OF BIRTH CALL BACK NUMBER REASON FOR CALL**this is important as we prioritize the call backs  YOU WILL RECEIVE A CALL BACK THE SAME DAY AS LONG AS YOU CALL BEFORE 4:00 PM   Do the following things EVERYDAY: Weigh yourself in the morning before breakfast. Write it down and keep it in a log. Take your medicines as prescribed Eat low salt foods--Limit salt (sodium) to 2000 mg per day.  Stay as active as you can everyday Limit all fluids for the day to less than 2 liters   At the [facility], you and your health needs are our priority. As part of our continuing mission to provide you with exceptional heart care, we have created designated Provider Care Teams.  These Care Teams include your primary Cardiologist (physician) and Advanced Practice Providers (APPs- Physician Assistants and Nurse Practitioners) who all work together to provide you with the care you need, when you need it.   You may see any of the following providers on your designated Care Team at your next follow up: Dr Arvilla Meres Dr Marca Ancona Dr. Marcos Eke, NP Robbie Lis, [place] [facility] [place], [place] Brynda Peon, NP Karle Plumber, PharmD   Please be sure to bring in all your medications bottles to every appointment.    Thank you for choosing [facility]

## 2022-10-14 ENCOUNTER — Telehealth (HOSPITAL_COMMUNITY): Payer: Self-pay | Admitting: Vascular Surgery

## 2022-10-14 ENCOUNTER — Other Ambulatory Visit (HOSPITAL_COMMUNITY): Payer: Self-pay | Admitting: Cardiology

## 2022-10-14 ENCOUNTER — Telehealth (HOSPITAL_COMMUNITY): Payer: Self-pay | Admitting: *Deleted

## 2022-10-14 DIAGNOSIS — I493 Ventricular premature depolarization: Secondary | ICD-10-CM

## 2022-10-14 MED ORDER — AMIODARONE HCL 200 MG PO TABS
200.0000 mg | ORAL_TABLET | Freq: Two times a day (BID) | ORAL | 3 refills | Status: DC
Start: 2022-10-14 — End: 2022-12-24

## 2022-10-14 NOTE — Telephone Encounter (Signed)
Lvm giving CT appt , 8/20

## 2022-10-14 NOTE — Telephone Encounter (Signed)
Pet scan auth request

## 2022-10-15 NOTE — Progress Notes (Unsigned)
Cardiology Office Note:   Date:  10/16/2022  ID:  Arthur Klein, DOB 10-26-1945, MRN [id number] PCP: Alysia Penna, MD  [facility] Providers Cardiologist:  Rollene Rotunda, MD Electrophysiologist:  Will Jorja Loa, MD {  History of Present Illness:   Arthur Klein is a 77 y.o. male  who presents for evaluation of bradycardia.  The patient recently was noted to have palpitations and an abnormal EKG.  He previously had PVCs noted in 2017.  At that time he had an abnormal stress test suggesting reduced ejection fraction.  There was no ischemia or infarction.  A month later follow-up echo demonstrated EF of 50 - 55%.      In Dec 2022 Echo suggested that the EF was slightly lower than previous at 40 - 45% so I changed him to [place].  He has had some episodes of dizziness and had a monitor with runs of NSVT.  This was in Feb 2023.   He had a perfusion study to rule out obstructive CAD.  He had no ischemia.  He did have an appt with Dr. Elberta Fortis as he had a high burden of ventricular ectopy on a monitor and was started on flecainide.   Monitor demonstrated a significantly reduced PVC burden.  At the last visit I increased Entresto.  When he saw Dr. Elberta Fortis in [place] 2024 he held the Flecainide for a week to see if his SOB improved.  He also failed amiodarone close it sounds like because of some hand swelling and eventually had attempted ablation which was not reported to be successful.  I read this note from Dr. Ladona Ridgel.  There is difficulty mapping and difficulty getting across the aortic valve.  The patient currently had quite a bit of discomfort in his groin.  His most recent echo in April 2024 demonstrated that his EF was down to 20 - 25%.  He had a cardiac cath with some mild non obstructive disease.   His right heart pressures were not markedly abnormal.  It was decided to hold the flecainide at discharge.  He has seen Dr. Gasper Lloyd and is to have a PET scan for sarcoid.   He is back on  amiodarone.  He says he actually is feeling well.  He is not describing palpitations.  He is not having any new shortness of breath, PND or orthopnea.  He is not having the hand swelling he was having with the amiodarone.  He is describing just some general body stiffness and leg weakness.  This is just been slowly progressive and I think I am related to his amiodarone.  He is not describing any new chest pressure, neck or arm discomfort.  He has had no presyncope or syncope.  ROS: As stated in the HPI and negative for all other systems.  Studies Reviewed:    EKG:   NA  Risk Assessment/Calculations:              Physical Exam:   VS:  BP 112/76   Pulse 76   Ht 5\' 8"  (1.727 m)   Wt 195 lb 6.4 oz (88.6 kg)   SpO2 97%   BMI 29.71 kg/m    Wt Readings from Last 3 Encounters:  10/16/22 195 lb 6.4 oz (88.6 kg)  10/07/22 193 lb 3.2 oz (87.6 kg)  09/22/22 191 lb (86.6 kg)     GEN: Well nourished, well developed in no acute distress NECK: No JVD; No carotid bruits CARDIAC: RRR, soft apical systolic murmur radiating  slightly at aortic outflow tract, no diastolic murmurs, rubs, gallops RESPIRATORY:  Clear to auscultation without rales, wheezing or rhonchi  ABDOMEN: Soft, non-tender, non-distended EXTREMITIES:  No edema; No deformity   ASSESSMENT AND PLAN:   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  He sees Dr. Ladona Ridgel on 8/26 and he will remain on the current dose of amiodarone until that time.    SYSTOLIC HF:   EF was down to 16% on the recent echo.  He is just recently had his med titrated.  He is going to proceed with PET study to rule out sarcoid.    Will also probably need PYP scanning if there is no suggestion of sarcoid.  He has a recall in the heart failure clinic in October.  For now he seems to be euvolemic.  Of note he also has cardiopulmonary stress testing pending.   DM: His A1c was 6.3.  We talked about diet.       Follow up with me in about four months.   Signed, Rollene Rotunda, MD

## 2022-10-16 ENCOUNTER — Encounter: Payer: Self-pay | Admitting: Cardiology

## 2022-10-16 ENCOUNTER — Ambulatory Visit: Payer: Medicare Other | Attending: Cardiology | Admitting: Cardiology

## 2022-10-16 VITALS — BP 112/76 | HR 76 | Ht 68.0 in | Wt 195.4 lb

## 2022-10-16 DIAGNOSIS — I5022 Chronic systolic (congestive) heart failure: Secondary | ICD-10-CM | POA: Diagnosis not present

## 2022-10-16 DIAGNOSIS — I493 Ventricular premature depolarization: Secondary | ICD-10-CM | POA: Diagnosis not present

## 2022-10-16 NOTE — Patient Instructions (Signed)
Medication Instructions:  Your physician recommends that you continue on your current medications as directed. Please refer to the Current Medication list given to you today.  *If you need a refill on your cardiac medications before your next appointment, please call your pharmacy*  Follow-Up: At [facility], you and your health needs are our priority.  As part of our continuing mission to provide you with exceptional heart care, we have created designated Provider Care Teams.  These Care Teams include your primary Cardiologist (physician) and Advanced Practice Providers (APPs -  Physician Assistants and Nurse Practitioners) who all work together to provide you with the care you need, when you need it.  We recommend signing up for the patient portal called "MyChart".  Sign up information is provided on this After Visit Summary.  MyChart is used to connect with patients for Virtual Visits (Telemedicine).  Patients are able to view lab/test results, encounter notes, upcoming appointments, etc.  Non-urgent messages can be sent to your provider as well.   To learn more about what you can do with MyChart, go to [email or web address].    Your next appointment:   4 month(s)  Provider:   Minus Breeding, MD

## 2022-10-21 ENCOUNTER — Ambulatory Visit (HOSPITAL_COMMUNITY)
Admission: RE | Admit: 2022-10-21 | Discharge: 2022-10-21 | Disposition: A | Discharge status: Home / Self Care | Payer: Medicare Other | Source: Ambulatory Visit | Attending: Cardiology | Admitting: Cardiology

## 2022-10-21 DIAGNOSIS — I5022 Chronic systolic (congestive) heart failure: Secondary | ICD-10-CM | POA: Insufficient documentation

## 2022-10-21 MED ORDER — IOHEXOL 350 MG/ML SOLN
50.0000 mL | Freq: Once | INTRAVENOUS | Status: AC | PRN
  Administered 2022-10-21: 50 mL via INTRAVENOUS

## 2022-10-22 ENCOUNTER — Telehealth (HOSPITAL_COMMUNITY): Payer: Self-pay | Admitting: *Deleted

## 2022-10-22 NOTE — Telephone Encounter (Signed)
Left voicemail to remind patient of CPX appointment on Tuesday 8/27 at 9am. Provided patient with call back [phone] if cancellation/reschedule is necessary.       Reggy Eye, MS, ACSM, [facility] Clinical Exercise Physiologist/ Health and Wellness Coach

## 2022-10-27 ENCOUNTER — Ambulatory Visit: Payer: Medicare Other | Attending: Internal Medicine

## 2022-10-27 ENCOUNTER — Ambulatory Visit: Payer: Medicare Other | Attending: Internal Medicine | Admitting: Internal Medicine

## 2022-10-27 ENCOUNTER — Encounter: Payer: Self-pay | Admitting: Internal Medicine

## 2022-10-27 VITALS — BP 140/82 | HR 80 | Ht 68.5 in | Wt 193.4 lb

## 2022-10-27 DIAGNOSIS — I493 Ventricular premature depolarization: Secondary | ICD-10-CM

## 2022-10-27 NOTE — Patient Instructions (Addendum)
Medication Instructions:  Your physician has recommended you make the following change in your medication:  Amiodarone: Take 1 tablet twice a day until the end of September. Take 1 tablet daily starting in October.  Lab Work: None ordered.  If you have labs (blood work) drawn today and your tests are completely normal, you will receive your results only by: MyChart Message (if you have MyChart) OR A paper copy in the mail If you have any lab test that is abnormal or we need to change your treatment, we will call you to review the results.  Testing/Procedures: Your physician has requested that you wear a Zio heart monitor for 3 days. This will be mailed to your home with instructions on how to apply the monitor and how to return it when finished. Please allow 2 weeks after returning the heart monitor before our office calls you with the results.   Follow-Up: At [facility], you and your health needs are our priority.  As part of our continuing mission to provide you with exceptional heart care, we have created designated Provider Care Teams.  These Care Teams include your primary Cardiologist (physician) and Advanced Practice Providers (APPs -  Physician Assistants and Nurse Practitioners) who all work together to provide you with the care you need, when you need it.   Your next appointment:   October 2024  The format for your next appointment:   In Person  Provider:   Lewayne Bunting, MD{or one of the following Advanced Practice Providers on your designated Care Team:   Francis Dowse, [place] Casimiro Needle "Mardelle Matte" [place], [place] Earnest Rosier, NP   Important Information About Sugar

## 2022-10-27 NOTE — Progress Notes (Unsigned)
Enrolled for [employer] to mail a ZIO XT long term holter monitor to the patients address on file.  

## 2022-10-27 NOTE — Progress Notes (Signed)
HPI Arthur Klein returns today for followup. He is a pleasant 77 yo man with a h/o chronic systolic heart failure and PVC's who underwent EP study. His PVC's were mapped to the LVOT region/coronary cusp but we never had an early site. Subsequent CMRI showed scar in the mid myocardial portion of the LVOT. Unclear if this represent sarcoid (PET scan is pending) or amyloid or something else. The patient was placed on amiodarone 200 bid and he thinks he is better. No syncope or chest pain.  Allergies  Allergen Reactions   Erythrocin Diarrhea   Erythromycin Diarrhea   Lisinopril Cough   Nsaids Other (See Comments)    Gi bleeding Has tolerated limited, small amounts of Meloxicam   Oxycodone Other (See Comments)    Became a "wild person" and had total amnesia   Ozempic (0.25 Or 0.5 Mg-Dose) [Semaglutide(0.25 Or 0.5mg -Dos)] Diarrhea     Current Outpatient Medications  Medication Sig Dispense Refill   acetaminophen (TYLENOL) 500 MG tablet Take 500-1,000 mg by mouth every 6 (six) hours as needed for fever (for pain.).      amiodarone (PACERONE) 200 MG tablet Take 1 tablet (200 mg total) by mouth 2 (two) times daily. 60 tablet 3   B Complex Vitamins (VITAMIN B COMPLEX) TABS Take 1 tablet by mouth daily.     Cholecalciferol (VITAMIN D-3) 1000 UNITS CAPS Take 1,000 Units by mouth daily.      colchicine 0.6 MG tablet Take 0.6 mg by mouth 2 (two) times daily as needed.     dapagliflozin propanediol (FARXIGA) 10 MG TABS tablet Take 1 tablet (10 mg total) by mouth daily. 30 tablet 11   diphenhydrAMINE (BENADRYL) 12.5 MG/5ML elixir Take 3.125 mg by mouth 4 (four) times daily as needed (for allergies.).     furosemide (LASIX) 20 MG tablet Take 1 tablet (20 mg total) by mouth as needed for edema (weight gain 3lbs in 1 day or 5lbs in 1 week). 30 tablet 0   metoprolol succinate (TOPROL XL) 25 MG 24 hr tablet Take 0.5 tablets (12.5 mg total) by mouth at bedtime. 90 tablet 3   Multiple Vitamins-Minerals  (ICAPS AREDS 2 PO) Take 1 capsule by mouth daily.     Nutritional Supplements (JUICE PLUS FIBRE PO) Take 2 tablets by mouth daily. Vegetable Fruit     PRESCRIPTION MEDICATION Take 2,000 mg by mouth once. Penicillin 500 mg each     Probiotic Product (PROBIOTIC DAILY PO) Take 1 tablet by mouth every morning.     sacubitril-valsartan (ENTRESTO) 49-51 MG Take 1 tablet by mouth 2 (two) times daily. 60 tablet 11   No current facility-administered medications for this visit.     Past Medical History:  Diagnosis Date   Arthritis    Complication of anesthesia    doesn't wake up well,combative until wife talks with him   Diabetes mellitus without complication (HCC)    diet controlled    Esophageal reflux    Gout    hx of   H/O hiatal hernia    History of kidney stones    Hypertension    borderline HTNt   Plantar fasciitis    no problems now   PONV (postoperative nausea and vomiting)    Prostate cancer (HCC) 08/27/2012   gleason 6, volume 59.3 cc   PVC (premature ventricular contraction)    hx of    Skin cancer 2011   melanoma on back, resected    Undescended left testes  ROS:   All systems reviewed and negative except as noted in the HPI.   Past Surgical History:  Procedure Laterality Date   CYSTOSCOPY WITH RETROGRADE PYELOGRAM, URETEROSCOPY AND STENT PLACEMENT Left 02/18/2016   Procedure: CYSTOSCOPY WITH LEFT RETROGRADE PYELOGRAM, URETEROSCOPY  AND URETERAL  STENT PLACEMENT;  Surgeon: Heloise Purpura, MD;  Location: [facility];  Service: Urology;  Laterality: Left;   HERNIA REPAIR     INGUINAL HERNIA REPAIR Bilateral 03/16/2015   Procedure: LAPAROSCOPIC REPAIR OF BILATERAL FEMEROL AND  BILATERAL INGUINAL HERNIAS WITH MESH;  Surgeon: Karie Soda, MD;  Location: [facility];  Service: General;  Laterality: Bilateral;   INSERTION OF MESH Right 03/16/2015   Procedure: INSERTION OF MESH;  Surgeon: Karie Soda, MD;  Location: [facility];  Service: General;  Laterality: Right;   KNEE  ARTHROPLASTY  05/02/2011   Procedure: COMPUTER ASSISTED TOTAL KNEE ARTHROPLASTY;  Surgeon: Harvie Junior, MD;  Location: [facility];  Service: Orthopedics;  Laterality: Right;   KNEE ARTHROSCOPY     right   LAPAROSCOPIC LYSIS OF ADHESIONS  03/16/2015   Procedure: LAPAROSCOPIC LYSIS OF ADHESIONS;  Surgeon: Karie Soda, MD;  Location: [facility];  Service: General;;   MELANOMA EXCISION     stage 3+ lower left lumbar region with axillary node removal   MOHS SURGERY     mose surgery     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   PROSTATE BIOPSY  08/27/12   PVC ABLATION N/A 09/22/2022   Procedure: PVC ABLATION;  Surgeon: Marinus Maw, MD;  Location: [facility]AS[facility];  Service: Cardiovascular;  Laterality: N/A;   RIGHT/LEFT HEART CATH AND CORONARY ANGIOGRAPHY N/A 06/25/2022   Procedure: RIGHT/LEFT HEART CATH AND CORONARY ANGIOGRAPHY;  Surgeon: Marykay Lex, MD;  Location: [facility] INVASIVE CV LAB;  Service: Cardiovascular;  Laterality: N/A;   ROBOT ASSISTED LAPAROSCOPIC RADICAL PROSTATECTOMY N/A 11/04/2012   Procedure: ROBOTIC ASSISTED LAPAROSCOPIC RADICAL PROSTATECTOMY LEVEL 1;  Surgeon: Crecencio Mc, MD;  Location: [facility];  Service: Urology;  Laterality: N/A;   ROTATOR CUFF REPAIR     right   ROTATOR CUFF REPAIR     left   SHOULDER ARTHROSCOPY     left     Family History  Problem Relation Age of Onset   Congestive Heart Failure Mother    Diabetes Mother    Hypertension Mother    Diabetes Father    Hypertension Father    Stroke Father    Cancer Father        prostate, pancreas   Cancer Brother 60       prostate- xrt, seed implant   Anesthesia problems Neg Hx    Hypotension Neg Hx    Malignant hyperthermia Neg Hx    Pseudochol deficiency Neg Hx      Social History   Socioeconomic History   Marital status: Married    Spouse name: Debbie   Number of children: 3   Years of education: Not on file   Highest education level: High school graduate  Occupational History   Occupation:  Retired  Tobacco Use   Smoking status: Never   Smokeless tobacco: Current    Types: Chew   Tobacco comments:    Patient declines materials to quit chewing tobacco.  Vaping Use   Vaping status: Never Used  Substance and Sexual Activity   Alcohol use: No   Drug use: No   Sexual activity: Yes  Other Topics Concern   Not on file  Social History  Narrative   Lives with wife.   Three children.     Social Determinants of Health   Financial Resource Strain: Low Risk  (06/26/2022)   Overall Financial Resource Strain (CARDIA)    Difficulty of Paying Living Expenses: Not very hard  Food Insecurity: No Food Insecurity (06/24/2022)   Hunger Vital Sign    Worried About Running Out of Food in the Last Year: Never true    Ran Out of Food in the Last Year: Never true  Transportation Needs: No Transportation Needs (06/24/2022)   PRAPARE - Administrator, Civil Service (Medical): No    Lack of Transportation (Non-Medical): No  Physical Activity: Not on file  Stress: Not on file  Social Connections: Unknown (07/18/2021)   Received from [facility]   Social Network    Social Network: Not on file  Intimate Partner Violence: Not At Risk (06/24/2022)   Humiliation, Afraid, Rape, and Kick questionnaire    Fear of Current or Ex-Partner: No    Emotionally Abused: No    Physically Abused: No    Sexually Abused: No     BP (!) 140/82   Pulse 80   Ht 5' 8.5" (1.74 m)   Wt 193 lb 6.4 oz (87.7 kg)   SpO2 97%   BMI 28.98 kg/m   Physical Exam:  Well appearing NAD HEENT: Unremarkable Neck:  No JVD, no thyromegally Lymphatics:  No adenopathy Back:  No CVA tenderness Lungs:  Clear with no wheezes HEART:  Regular rate rhythm, no murmurs, no rubs, no clicks Abd:  soft, positive bowel sounds, no organomegally, no rebound, no guarding Ext:  2 plus pulses, no edema, no cyanosis, no clubbing Skin:  No rashes no nodules Neuro:  CN II through XII intact, motor grossly intact  EKG - NSR  with a single PVC.  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 I have asked him to take 400 mg of amio a day until 9/30 and then go down to 200 mg daily and I will have him wear a Zio and I will plan a repeat 2D echo in October. If his EF is still down, we will consider primary prevention ICD. Chronic systolic heart failure - he appears improved on medical therapy withlasix and a beta blocker and farxiga.

## 2022-10-28 ENCOUNTER — Ambulatory Visit (HOSPITAL_COMMUNITY): Payer: Medicare Other | Attending: Cardiology

## 2022-10-28 DIAGNOSIS — I5022 Chronic systolic (congestive) heart failure: Secondary | ICD-10-CM

## 2022-10-31 ENCOUNTER — Telehealth (HOSPITAL_COMMUNITY): Payer: Self-pay | Admitting: Cardiology

## 2022-10-31 DIAGNOSIS — I493 Ventricular premature depolarization: Secondary | ICD-10-CM

## 2022-10-31 NOTE — Telephone Encounter (Signed)
Patients wife called to report PET scan was denied by insurance. Would like to discuss in detail.

## 2022-12-14 NOTE — Progress Notes (Incomplete)
ADVANCED HEART FAILURE CLINIC NOTE  Referring Physician: Alysia Penna, MD  Primary Care: Alysia Penna, MD Primary Cardiologist: Dr. Antoine Poche EP: Dr. Elberta Fortis HF: Gasper Lloyd  HPI: Arthur Klein is a 77 y.o. 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  His cardiac history dates back to at least 2022 with echocardiogram demonstrating EF of 40 to 45% at that time.  In early 2023 he wore a Zio patch with 28% PVC burden; started on flecainide with improvement in PVC burden to 4.9% on repeat monitor in October 2023.  He was admitted in April 2024 with acute on chronic heart failure exacerbation; echocardiogram at that time with a EF of 20 to 25%.  Right and left heart catheterization with nonobstructive CAD and cardiac index of 2.47. He was diuresed and started on GDMT. He had a repeat ziopatch from  07/16/22-07/23/22 w/ a 28% PVC burden. Unsuccessful ablation in 7/24 in  Now he is on amiodarone 200mg  BID and present for follow up.   Interval hx:  Feels better from a functional standpoint; LVEF remains severely reduced. He gets fatigued after moderate exertion. No lightheadedness, LE edema or CP.   Activity level/exercise tolerance:  NYHA IIB-III; difficult to assess.  Orthopnea:  Sleeps on 2 pillows Paroxysmal noctural dyspnea:  No Chest pain/pressure:  No Orthostatic lightheadedness:  No Palpitations:  minimal Lower extremity edema: trace pretibial edema Presyncope/syncope:  No Cough:  No  Past Medical History:  Diagnosis Date   Arthritis    Complication of anesthesia    doesn't wake up well,combative until wife talks with him   Diabetes mellitus without complication (HCC)    diet controlled    Esophageal reflux    Gout    hx of   H/O hiatal hernia    History of kidney stones    Hypertension    borderline HTNt   Plantar fasciitis    no problems now   PONV (postoperative nausea and vomiting)     Prostate cancer (HCC) 08/27/2012   gleason 6, volume 59.3 cc   PVC (premature ventricular contraction)    hx of    Skin cancer 2011   melanoma on back, resected    Undescended left testes     Current Outpatient Medications  Medication Sig Dispense Refill   acetaminophen (TYLENOL) 500 MG tablet Take 500-1,000 mg by mouth every 6 (six) hours as needed for fever (for pain.).      amiodarone (PACERONE) 200 MG tablet Take 1 tablet (200 mg total) by mouth 2 (two) times daily. 60 tablet 3   B Complex Vitamins (VITAMIN B COMPLEX) TABS Take 1 tablet by mouth daily.     Cholecalciferol (VITAMIN D-3) 1000 UNITS CAPS Take 1,000 Units by mouth daily.      colchicine 0.6 MG tablet Take 0.6 mg by mouth 2 (two) times daily as needed.     dapagliflozin propanediol (FARXIGA) 10 MG TABS tablet Take 1 tablet (10 mg total) by mouth daily. 30 tablet 11   diphenhydrAMINE (BENADRYL) 12.5 MG/5ML elixir Take 3.125 mg by mouth 4 (four) times daily as needed (for allergies.).     furosemide (LASIX) 20 MG tablet Take 1 tablet (20 mg total) by mouth as needed for edema (weight gain 3lbs in 1 day or 5lbs in 1 week). 30 tablet 0   metoprolol succinate (TOPROL XL) 25 MG 24 hr tablet Take 0.5 tablets (12.5 mg total) by mouth at bedtime. 90 tablet  3   Multiple Vitamins-Minerals (ICAPS AREDS 2 PO) Take 1 capsule by mouth daily.     Nutritional Supplements (JUICE PLUS FIBRE PO) Take 2 tablets by mouth daily. Vegetable Fruit     PRESCRIPTION MEDICATION Take 2,000 mg by mouth once. Penicillin 500 mg each     Probiotic Product (PROBIOTIC DAILY PO) Take 1 tablet by mouth every morning.     sacubitril-valsartan (ENTRESTO) 49-51 MG Take 1 tablet by mouth 2 (two) times daily. 60 tablet 11   No current facility-administered medications for this visit.    Allergies  Allergen Reactions   Erythrocin Diarrhea   Erythromycin Diarrhea   Lisinopril Cough   Nsaids Other (See Comments)    Gi bleeding Has tolerated limited, small  amounts of Meloxicam   Oxycodone Other (See Comments)    Became a "wild person" and had total amnesia   Ozempic (0.25 Or 0.5 Mg-Dose) [Semaglutide(0.25 Or 0.5mg -Dos)] Diarrhea      Social History   Socioeconomic History   Marital status: Married    Spouse name: Arthur Klein   Number of children: 3   Years of education: Not on file   Highest education level: High school graduate  Occupational History   Occupation: Retired  Tobacco Use   Smoking status: Never   Smokeless tobacco: Current    Types: Chew   Tobacco comments:    Patient declines materials to quit chewing tobacco.  Vaping Use   Vaping status: Never Used  Substance and Sexual Activity   Alcohol use: No   Drug use: No   Sexual activity: Yes  Other Topics Concern   Not on file  Social History Narrative   Lives with wife.   Three children.     Social Determinants of Health   Financial Resource Strain: Low Risk  (06/26/2022)   Overall Financial Resource Strain (CARDIA)    Difficulty of Paying Living Expenses: Not very hard  Food Insecurity: No Food Insecurity (06/24/2022)   Hunger Vital Sign    Worried About Running Out of Food in the Last Year: Never true    Ran Out of Food in the Last Year: Never true  Transportation Needs: No Transportation Needs (06/24/2022)   PRAPARE - Administrator, Civil Service (Medical): No    Lack of Transportation (Non-Medical): No  Physical Activity: Not on file  Stress: Not on file  Social Connections: Unknown (07/18/2021)   Received from [facility]   Social Network    Social Network: Not on file  Intimate Partner Violence: Not At Risk (06/24/2022)   Humiliation, Afraid, Rape, and Kick questionnaire    Fear of Current or Ex-Partner: No    Emotionally Abused: No    Physically Abused: No    Sexually Abused: No      Family History  Problem Relation Age of Onset   Congestive Heart Failure Mother    Diabetes Mother    Hypertension Mother    Diabetes  Father    Hypertension Father    Stroke Father    Cancer Father        prostate, pancreas   Cancer Brother 51       prostate- xrt, seed implant   Anesthesia problems Neg Hx    Hypotension Neg Hx    Malignant hyperthermia Neg Hx    Pseudochol deficiency Neg Hx     PHYSICAL EXAM: There were no vitals filed for this visit. GENERAL: Well nourished, well developed, and in no apparent distress  at rest.  HEENT: Negative for arcus senilis or xanthelasma. There is no scleral icterus.  The mucous membranes are pink and moist.   NECK: Supple, No masses. Normal carotid upstrokes without bruits. No masses or thyromegaly.    CHEST: There are no chest wall deformities. There is no chest wall tenderness. Respirations are unlabored.  Lungs- *** CARDIAC:  JVP: *** cm          Normal rate with regular rhythm. No murmurs, rubs or gallops.  Pulses are 2+ and symmetrical in upper and lower extremities. *** edema.  ABDOMEN: Soft, non-tender, non-distended. There are no masses or hepatomegaly. There are normal bowel sounds.  EXTREMITIES: Warm and well perfused with no cyanosis, clubbing.  LYMPHATIC: No axillary or supraclavicular lymphadenopathy.  NEUROLOGIC: Patient is oriented x3 with no focal or lateralizing neurologic deficits.  PSYCH: Patients affect is appropriate, there is no evidence of anxiety or depression.  SKIN: Warm and dry; no lesions or wounds.    DATA REVIEW  ECG: 08/06/22: NSR with frequent PVCs  ECHO: 06/24/22: LVEF 20-25%, normal RV function as per my interpretation  CATH: 06/25/22: Diffuse ectasia of the RCA and LCx Moderate to severe concentric calcification in the proximal to mid LAD with mild to moderate concentric calcification in the proximal to mid LCx and RCA Most notable lesion is a 50% proximal RCA lesion and a bend between ectatic segments, otherwise moderate disease in OM1 and D2.  No flow-limiting lesions. Normal right heart cath pressures with LV EDP and PCWP of 6 to 9  mmHg.  Mean PAP 15 mmHg. Cardiac Output and Index: 5.01-2.47  CMR: 1. Severe LVE with global hypokinesis LVEF 28%  2. Diffuse mid myocardial gadolinium uptake at base with small area of scar in the most basal anterior septum under the aortic annulus  3.  Mild decrease in RV function RVEF 37%  4. 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 Normal T2 suggests no active inflammation or myocarditis  4.  Preserved cardiac output 4.2 L/min  5.  Mild appearing MR  6.  Moderate LAE   ASSESSMENT & PLAN:  Heart failure with reduced EF Etiology of WU:[id number] cardiomyopathy; LHC/RHC above. 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 Will obtain PET-CT to evaluate for cardiac sarcoid. 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 PET-CT pending.  NYHA class / AHA Stage:NYHA IIB-III Volume status & Diuretics: Euvolemic  Vasodilators: Increase entresto to 49/51mg  BID Beta-Blocker:toprol 12.5mg  qHS; reported to be bradycardic previously, however, likely due to low pulses due to frequent PVC; today pulse rate of 45 with actual HR by EKG in 80s.  FAO:[id number]; will hold for now Cardiometabolic: continue farxiga 10mg  daily.  Devices therapies & Valvulopathies:will discuss with EP Advanced therapies:Not currently a candidate.   2. Frequent PVC - Now on amiodarone 200mg  BID - Recent ziopatch with 28% PVC burden - S/P PVC ablation on 09/22/22 unsuccessful.   3. 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 Will attempt to uptitrate to daily dosing.   4. Borderline DM -Most recent A1c 5.9% -Unable to afford semaglutide -Continue Farxiga   Paxton Kanaan Advanced Heart Failure Mechanical Circulatory Support

## 2022-12-15 ENCOUNTER — Ambulatory Visit (HOSPITAL_COMMUNITY)
Admission: RE | Admit: 2022-12-15 | Discharge: 2022-12-15 | Disposition: A | Discharge status: Home / Self Care | Payer: Medicare Other | Source: Ambulatory Visit | Attending: Cardiology | Admitting: Cardiology

## 2022-12-15 ENCOUNTER — Encounter (HOSPITAL_COMMUNITY): Payer: Self-pay | Admitting: Cardiology

## 2022-12-15 VITALS — BP 128/76 | HR 72 | Wt 198.8 lb

## 2022-12-15 DIAGNOSIS — Z7984 Long term (current) use of oral hypoglycemic drugs: Secondary | ICD-10-CM | POA: Insufficient documentation

## 2022-12-15 DIAGNOSIS — I110 Hypertensive heart disease with heart failure: Secondary | ICD-10-CM | POA: Diagnosis not present

## 2022-12-15 DIAGNOSIS — I428 Other cardiomyopathies: Secondary | ICD-10-CM | POA: Diagnosis not present

## 2022-12-15 DIAGNOSIS — Z79899 Other long term (current) drug therapy: Secondary | ICD-10-CM | POA: Diagnosis not present

## 2022-12-15 DIAGNOSIS — I2510 Atherosclerotic heart disease of native coronary artery without angina pectoris: Secondary | ICD-10-CM | POA: Insufficient documentation

## 2022-12-15 DIAGNOSIS — E118 Type 2 diabetes mellitus with unspecified complications: Secondary | ICD-10-CM

## 2022-12-15 DIAGNOSIS — I493 Ventricular premature depolarization: Secondary | ICD-10-CM | POA: Diagnosis not present

## 2022-12-15 DIAGNOSIS — I5022 Chronic systolic (congestive) heart failure: Secondary | ICD-10-CM | POA: Insufficient documentation

## 2022-12-15 DIAGNOSIS — E119 Type 2 diabetes mellitus without complications: Secondary | ICD-10-CM | POA: Diagnosis not present

## 2022-12-15 LAB — BASIC METABOLIC PANEL WITH GFR
Anion gap: 8 (ref 5–15)
BUN: 13 mg/dL (ref 8–23)
CO2: 27 mmol/L (ref 22–32)
Calcium: 9.3 mg/dL (ref 8.9–10.3)
Chloride: 102 mmol/L (ref 98–111)
Creatinine, Ser: 1.15 mg/dL (ref 0.61–1.24)
GFR, Estimated: >60 mL/min
Glucose, Bld: 117 mg/dL — ABNORMAL HIGH (ref 70–99)
Potassium: 4.1 mmol/L (ref 3.5–5.1)
Sodium: 137 mmol/L (ref 135–145)

## 2022-12-15 LAB — BRAIN NATRIURETIC PEPTIDE: B Natriuretic Peptide: 213.3 pg/mL — ABNORMAL HIGH (ref 0.0–100.0)

## 2022-12-15 MED ORDER — METOPROLOL SUCCINATE ER 25 MG PO TB24: 25.0000 mg | ORAL_TABLET | Freq: Every evening | ORAL | 3 refills | Status: DC

## 2022-12-15 MED ORDER — ENTRESTO 97-103 MG PO TABS: 1.0000 | ORAL_TABLET | Freq: Two times a day (BID) | ORAL | 3 refills | Status: DC

## 2022-12-15 NOTE — Patient Instructions (Addendum)
Medication Changes:  INCREASE ENTRESTO TO 97/103 MG TWICE DAILY   INCREASE METOPROLOL SUCCINATE TO 25MG  AT NIGHT TIME   Lab Work:  Labs done today, your results will be available in MyChart, we will contact you for abnormal readings.  Referrals:  YOU HAVE BEEN REFERRED TO [facility] THEY WILL REACH OUT TO YOU OR CALL TO ARRANGE THIS. PLEASE CALL us WITH ANY CONCERNS   Follow-Up in: 3 MONTHS WITH DR. Gasper Lloyd WITH AN ECHO PLEASE CALL OUR OFFICE AROUND MID NOVEMBER TO GET SCHEDULED FOR YOUR APPOINTMENT. PHONE NUMBER IS [phone] OPTION 2   At the [facility], you and your health needs are our priority. We have a designated team specialized in the treatment of Heart Failure. This Care Team includes your primary Heart Failure Specialized Cardiologist (physician), Advanced Practice Providers (APPs- Physician Assistants and Nurse Practitioners), and Pharmacist who all work together to provide you with the care you need, when you need it.   You may see any of the following providers on your designated Care Team at your next follow up:  Dr. Arvilla Meres Dr. Marca Ancona Dr. Dorthula Nettles Dr. Theresia Bough Tonye Becket, NP Robbie Lis, [place] [facility] [place], [place] Brynda Peon, NP [place] Lee, NP Karle Plumber, PharmD   Please be sure to bring in all your medications bottles to every appointment.   Need to Contact us:  If you have any questions or concerns before your next appointment please send [place] a message through [employer] or call our office at [phone].    TO LEAVE A MESSAGE FOR THE NURSE SELECT OPTION 2, PLEASE LEAVE A MESSAGE INCLUDING: YOUR NAME DATE OF BIRTH CALL BACK NUMBER REASON FOR CALL**this is important as we prioritize the call backs  YOU WILL RECEIVE A CALL BACK THE SAME DAY AS LONG AS YOU CALL BEFORE 4:00 PM

## 2022-12-24 ENCOUNTER — Ambulatory Visit: Payer: Medicare Other | Attending: Internal Medicine | Admitting: Internal Medicine

## 2022-12-24 ENCOUNTER — Encounter: Payer: Self-pay | Admitting: Internal Medicine

## 2022-12-24 VITALS — BP 116/72 | HR 75 | Ht 68.5 in | Wt 196.0 lb

## 2022-12-24 DIAGNOSIS — I493 Ventricular premature depolarization: Secondary | ICD-10-CM

## 2022-12-24 MED ORDER — AMIODARONE HCL 200 MG PO TABS: 200.0000 mg | ORAL_TABLET | Freq: Every day | ORAL | 3 refills | Status: DC

## 2022-12-24 NOTE — Patient Instructions (Addendum)
Medication Instructions:  Your physician has recommended you make the following change in your medication:  Decrease amiodarone 200 mg to once daily.  Lab Work: None ordered.  If you have labs (blood work) drawn today and your tests are completely normal, you will receive your results only by: MyChart Message (if you have MyChart) OR A paper copy in the mail If you have any lab test that is abnormal or we need to change your treatment, we will call you to review the results.  Testing/Procedures: echocardiogram   Your physician has requested that you have an echocardiogram. Echocardiography is a painless test that uses sound waves to create images of your heart. It provides your doctor with information about the size and shape of your heart and how well your heart's chambers and valves are working. This procedure takes approximately one hour. There are no restrictions for this procedure. Please do NOT wear cologne, perfume, aftershave, or lotions (deodorant is allowed). Please arrive 15 minutes prior to your appointment time.   Follow-Up: At [facility], you and your health needs are our priority.  As part of our continuing mission to provide you with exceptional heart care, we have created designated Provider Care Teams.  These Care Teams include your primary Cardiologist (physician) and Advanced Practice Providers (APPs -  Physician Assistants and Nurse Practitioners) who all work together to provide you with the care you need, when you need it.   Your next appointment:   1 year(s)  The format for your next appointment:   In Person  Provider:   Lewayne Bunting, MD{or one of the following Advanced Practice Providers on your designated Care Team:   Francis Dowse, [place] Casimiro Needle "Mardelle Matte" [place], [place] Earnest Rosier, NP   Important Information About Sugar

## 2022-12-24 NOTE — Progress Notes (Signed)
HPI [name] returns today for followup. He is a pleasant 77 yo man with a h/o chronic systolic heart failure and PVC's who underwent EP study. His PVC's were mapped to the LVOT region/coronary cusp but we never had an early site. Subsequent CMRI showed scar in the mid myocardial portion of the LVOT. Unclear if this represent sarcoid (PET scan is pending) or amyloid or something else. The patient was placed on amiodarone 200 bid and he thinks he is better. No syncope or chest pain. His repeat monitor showed only 3% PVC's on amiodarone.  Allergies  Allergen Reactions   Erythrocin Diarrhea   Erythromycin Diarrhea   Lisinopril Cough   Nsaids Other (See Comments)    Gi bleeding Has tolerated limited, small amounts of Meloxicam   Oxycodone Other (See Comments)    Became a "wild person" and had total amnesia   Ozempic (0.25 Or 0.5 Mg-Dose) [Semaglutide(0.25 Or 0.5mg -Dos)] Diarrhea     Current Outpatient Medications  Medication Sig Dispense Refill   acetaminophen (TYLENOL) 500 MG tablet Take 500-1,000 mg by mouth every 6 (six) hours as needed for fever (for pain.).      amiodarone (PACERONE) 200 MG tablet Take 1 tablet (200 mg total) by mouth 2 (two) times daily. 60 tablet 3   B Complex Vitamins (VITAMIN B COMPLEX) TABS Take 1 tablet by mouth daily.     chlorhexidine (PERIDEX) 0.12 % solution SMARTSIG:1 Capful(s) By Mouth Twice Daily     Cholecalciferol (VITAMIN D-3) 1000 UNITS CAPS Take 1,000 Units by mouth daily.      colchicine 0.6 MG tablet Take 0.6 mg by mouth 2 (two) times daily as needed.     dapagliflozin propanediol (FARXIGA) 10 MG TABS tablet Take 1 tablet (10 mg total) by mouth daily. 30 tablet 11   diphenhydrAMINE (BENADRYL) 12.5 MG/5ML elixir Take 3.125 mg by mouth 4 (four) times daily as needed (for allergies.).     ENTRESTO 49-51 MG Take 1 tablet by mouth 2 (two) times daily.     furosemide (LASIX) 20 MG tablet Take 1 tablet (20 mg total) by mouth as needed for edema  (weight gain 3lbs in 1 day or 5lbs in 1 week). 30 tablet 0   metoprolol succinate (TOPROL XL) 25 MG 24 hr tablet Take 1 tablet (25 mg total) by mouth at bedtime. 90 tablet 3   Multiple Vitamins-Minerals (ICAPS AREDS 2 PO) Take 1 capsule by mouth daily.     Nutritional Supplements (JUICE PLUS FIBRE PO) Take 2 tablets by mouth daily. Vegetable Fruit     PRESCRIPTION MEDICATION Take 2,000 mg by mouth once. Penicillin 500 mg each     Probiotic Product (PROBIOTIC DAILY PO) Take 1 tablet by mouth every morning.     No current facility-administered medications for this visit.     Past Medical History:  Diagnosis Date   Arthritis    Complication of anesthesia    doesn't wake up well,combative until wife talks with him   Diabetes mellitus without complication (HCC)    diet controlled    Esophageal reflux    Gout    hx of   H/O hiatal hernia    History of kidney stones    Hypertension    borderline HTNt   Plantar fasciitis    no problems now   PONV (postoperative nausea and vomiting)    Prostate cancer (HCC) 08/27/2012   gleason 6, volume 59.3 cc   PVC (premature ventricular contraction)  hx of    Skin cancer 2011   melanoma on back, resected    Undescended left testes     ROS:   All systems reviewed and negative except as noted in the HPI.   Past Surgical History:  Procedure Laterality Date   CYSTOSCOPY WITH RETROGRADE PYELOGRAM, URETEROSCOPY AND STENT PLACEMENT Left 02/18/2016   Procedure: CYSTOSCOPY WITH LEFT RETROGRADE PYELOGRAM, URETEROSCOPY  AND URETERAL  STENT PLACEMENT;  Surgeon: Heloise Purpura, MD;  Location: [facility];  Service: Urology;  Laterality: Left;   HERNIA REPAIR     INGUINAL HERNIA REPAIR Bilateral 03/16/2015   Procedure: LAPAROSCOPIC REPAIR OF BILATERAL FEMEROL AND  BILATERAL INGUINAL HERNIAS WITH MESH;  Surgeon: Karie Soda, MD;  Location: [facility];  Service: General;  Laterality: Bilateral;   INSERTION OF MESH Right 03/16/2015   Procedure: INSERTION OF MESH;   Surgeon: Karie Soda, MD;  Location: [facility];  Service: General;  Laterality: Right;   KNEE ARTHROPLASTY  05/02/2011   Procedure: COMPUTER ASSISTED TOTAL KNEE ARTHROPLASTY;  Surgeon: Harvie Junior, MD;  Location: [facility];  Service: Orthopedics;  Laterality: Right;   KNEE ARTHROSCOPY     right   LAPAROSCOPIC LYSIS OF ADHESIONS  03/16/2015   Procedure: LAPAROSCOPIC LYSIS OF ADHESIONS;  Surgeon: Karie Soda, MD;  Location: [facility];  Service: General;;   MELANOMA EXCISION     stage 3+ lower left lumbar region with axillary node removal   MOHS SURGERY     mose surgery     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   PROSTATE BIOPSY  08/27/12   PVC ABLATION N/A 09/22/2022   Procedure: PVC ABLATION;  Surgeon: Marinus Maw, MD;  Location: [facility] INVASIVE CV LAB;  Service: Cardiovascular;  Laterality: N/A;   RIGHT/LEFT HEART CATH AND CORONARY ANGIOGRAPHY N/A 06/25/2022   Procedure: RIGHT/LEFT HEART CATH AND CORONARY ANGIOGRAPHY;  Surgeon: Marykay Lex, MD;  Location: [facility] INVASIVE CV LAB;  Service: Cardiovascular;  Laterality: N/A;   ROBOT ASSISTED LAPAROSCOPIC RADICAL PROSTATECTOMY N/A 11/04/2012   Procedure: ROBOTIC ASSISTED LAPAROSCOPIC RADICAL PROSTATECTOMY LEVEL 1;  Surgeon: Crecencio Mc, MD;  Location: [facility];  Service: Urology;  Laterality: N/A;   ROTATOR CUFF REPAIR     right   ROTATOR CUFF REPAIR     left   SHOULDER ARTHROSCOPY     left     Family History  Problem Relation Age of Onset   Congestive Heart Failure Mother    Diabetes Mother    Hypertension Mother    Diabetes Father    Hypertension Father    Stroke Father    Cancer Father        prostate, pancreas   Cancer Brother 75       prostate- xrt, seed implant   Anesthesia problems Neg Hx    Hypotension Neg Hx    Malignant hyperthermia Neg Hx    Pseudochol deficiency Neg Hx      Social History   Socioeconomic History   Marital status: Married    Spouse name: Debbie   Number of children: 3   Years of education: Not on file    Highest education level: High school graduate  Occupational History   Occupation: Retired  Tobacco Use   Smoking status: Never   Smokeless tobacco: Current    Types: Chew   Tobacco comments:    Patient declines materials to quit chewing tobacco.  Vaping Use   Vaping status: Never Used  Substance and Sexual Activity   Alcohol  use: No   Drug use: No   Sexual activity: Yes  Other Topics Concern   Not on file  Social History Narrative   Lives with wife.   Three children.     Social Determinants of Health   Financial Resource Strain: Low Risk  (06/26/2022)   Overall Financial Resource Strain (CARDIA)    Difficulty of Paying Living Expenses: Not very hard  Food Insecurity: No Food Insecurity (06/24/2022)   Hunger Vital Sign    Worried About Running Out of Food in the Last Year: Never true    Ran Out of Food in the Last Year: Never true  Transportation Needs: No Transportation Needs (06/24/2022)   PRAPARE - Administrator, Civil Service (Medical): No    Lack of Transportation (Non-Medical): No  Physical Activity: Not on file  Stress: Not on file  Social Connections: Unknown (07/18/2021)   Received from [facility], [facility]   Social Network    Social Network: Not on file  Intimate Partner Violence: Not At Risk (06/24/2022)   Humiliation, Afraid, Rape, and Kick questionnaire    Fear of Current or Ex-Partner: No    Emotionally Abused: No    Physically Abused: No    Sexually Abused: No     BP 116/72   Pulse 75   Ht 5' 8.5" (1.74 m)   Wt 196 lb (88.9 kg)   SpO2 95%   BMI 29.37 kg/m   Physical Exam:  Well appearing NAD HEENT: Unremarkable Neck:  No JVD, no thyromegally Lymphatics:  No adenopathy Back:  No CVA tenderness Lungs:  Clear HEART:  Regular rate rhythm, no murmurs, no rubs, no clicks Abd:  soft, positive bowel sounds, no organomegally, no rebound, no guarding Ext:  2 plus pulses, no edema, no cyanosis, no clubbing Skin:  No rashes no  nodules Neuro:  CN II through XII intact, motor grossly intact  EKG - nsr  DEVICE  Normal device function.  See PaceArt for details.   Assess/Plan:  PVC's - he appears to have suppressed. I have asked him to take 400 mg of amio a day until 9/30 and then go down to 200 mg daily and I will have him wear a Zio and I will plan a repeat 2D echo in a couple of weeks. If his EF is still down, we will consider primary prevention ICD. Chronic systolic heart failure - he appears improved on medical therapy withlasix and a beta blocker and farxiga.we will recheck the echo.  Dorathy Daft

## 2023-01-01 ENCOUNTER — Ambulatory Visit: Payer: Medicare Other | Admitting: Internal Medicine

## 2023-01-20 ENCOUNTER — Ambulatory Visit (HOSPITAL_COMMUNITY): Payer: Medicare Other | Attending: Internal Medicine

## 2023-01-20 DIAGNOSIS — I493 Ventricular premature depolarization: Secondary | ICD-10-CM | POA: Diagnosis present

## 2023-01-20 DIAGNOSIS — I10 Essential (primary) hypertension: Secondary | ICD-10-CM | POA: Diagnosis not present

## 2023-01-20 LAB — ECHOCARDIOGRAM COMPLETE
AR max vel: 1.25 cm2
AV Area VTI: 1.35 cm2
AV Area mean vel: 1.28 cm2
AV Mean grad: 11 mm[Hg]
AV Peak grad: 20.6 mm[Hg]
Ao pk vel: 2.27 m/s
Area-P 1/2: 3.12 cm2
P 1/2 time: 412 ms
S' Lateral: 4.6 cm

## 2023-01-20 MED ORDER — PERFLUTREN LIPID MICROSPHERE
1.0000 mL | INTRAVENOUS | Status: AC | PRN
Start: 2023-01-20 — End: 2023-01-20
  Administered 2023-01-20: 2 mL via INTRAVENOUS

## 2023-02-02 ENCOUNTER — Telehealth: Payer: Self-pay | Admitting: Internal Medicine

## 2023-02-02 NOTE — Telephone Encounter (Signed)
Spoke with Arthur Klein. Told Arthur Klein Dr Bruna Potter recommends. Arthur Klein agreed.

## 2023-02-02 NOTE — Telephone Encounter (Signed)
Spoke with pt. Gave echo results. Pt wants to know if Dr Ladona Ridgel plans to proceed with ICD. Told him I would review the next steps with Dr Ladona Ridgel when he is back in office.

## 2023-02-02 NOTE — Telephone Encounter (Signed)
No indication for ICD with EF 40-45%. GT

## 2023-02-02 NOTE — Telephone Encounter (Signed)
Patient would like to discuss echo results.

## 2023-02-18 DIAGNOSIS — I2510 Atherosclerotic heart disease of native coronary artery without angina pectoris: Secondary | ICD-10-CM | POA: Insufficient documentation

## 2023-02-18 NOTE — Progress Notes (Unsigned)
Cardiology Office Note:   Date:  02/19/2023  ID:  Brashaun Saltzman, DOB [birth year], MRN [id number] PCP: Alysia Penna, MD   HeartCare Providers Cardiologist:  Rollene Rotunda, MD Electrophysiologist:  Will Jorja Loa, MD {  History of Present Illness:   Arthur Klein is a 77 y.o. male who presents for evaluation of bradycardia.  The patient recently was noted to have palpitations and an abnormal EKG.  He previously had PVCs noted in 2017.  At that time he had an abnormal stress test suggesting reduced ejection fraction.  There was no ischemia or infarction.  A month later follow-up echo demonstrated EF of 50 - 55%.      In Dec 2022 Echo suggested that the EF was slightly lower than previous at 40 - 45% so I changed him to [place].  He has had some episodes of dizziness and had a monitor with runs of NSVT.  This was in Feb 2023.   He had a perfusion study to rule out obstructive CAD.  He had no ischemia.  He did have an appt with Dr. Elberta Fortis as he had a high burden of ventricular ectopy on a monitor and was started on flecainide.   Monitor demonstrated a significantly reduced PVC burden.  At the last visit I increased Entresto.  When he saw Dr. Elberta Fortis in [place] 2024 he held the Flecainide for a week to see if his SOB improved.  He also failed amiodarone close it sounds like because of some hand swelling and eventually had attempted ablation which was not reported to be successful.  I read this note from Dr. Ladona Ridgel.  There is difficulty mapping and difficulty getting across the aortic valve.  The patient currently had quite a bit of discomfort in his groin.  His most recent echo in April 2024 demonstrated that his EF was down to 20 - 25%.  He had a cardiac cath with some mild non obstructive disease.   His right heart pressures were not markedly abnormal.  It was decided to hold the flecainide at discharge.  He has seen Dr. Gasper Lloyd and is to have a PET scan for sarcoid in January 2025.   He is  on amiodarone.  Since I last saw him he was seen in the EP clinic and HF clinic.  He wore a Zio patch and had 28% PVC burden.  He has actually done relatively well since I last saw him.  He has had a mild cough.  He has not been having any PND or orthopnea.  He denies any palpitations he does not really seem to notice much in the way of his PVCs.  He is not having any chest pressure, neck or arm discomfort.  He had no weight gain or edema.  ROS: As stated in the HPI and negative for all other systems.  Studies Reviewed:    EKG:   NA  Risk Assessment/Calculations:         Physical Exam:   VS:  BP 129/77 (BP Location: Right Arm, Patient Position: Sitting, Cuff Size: Normal)   Pulse 77   Ht 5\' 8"  (1.727 m)   Wt 199 lb (90.3 kg)   SpO2 96%   BMI 30.26 kg/m    Wt Readings from Last 3 Encounters:  02/19/23 199 lb (90.3 kg)  12/24/22 196 lb (88.9 kg)  12/15/22 198 lb 12.8 oz (90.2 kg)     GEN: Well nourished, well developed in no acute distress NECK: No JVD; No carotid bruits  CARDIAC: RRR, no murmurs, rubs, gallops RESPIRATORY:  Clear to auscultation without rales, wheezing or rhonchi  ABDOMEN: Soft, non-tender, non-distended EXTREMITIES:  No edema; No deformity   ASSESSMENT AND PLAN:   Heart failure with reduced EF: He seems to be doing relatively well.  I have suggested that we try again to go back to [facility] at the highest dose.  He had a little trouble taking this in the past but I think they are willing to try.  It looks like this was attempted at the last visit with the heart failure team but I do not know that this happened.  Continue other meds as listed.  Of note he has a PET scan scheduled to look for sarcoid in January.  Of note he had only mild abnormalities on CPX and is not an advanced therapy candidate.  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  I will check a TSH and CMET today.   CAD: He had previously nonobstructive coronary disease on last cath.  No change in therapy.    Borderline DM: A1c was 6.3.  Continue Farxiga.  He is unable to afford semaglutide.  Follow-up also per primary provider.  Dyslipidemia: LDL was 93.  He does not want to increase his Lipitor previously.  No change in therapy.     Follow up with me in 4 months.  Signed, Rollene Rotunda, MD

## 2023-02-19 ENCOUNTER — Ambulatory Visit: Payer: Medicare Other | Attending: Cardiology | Admitting: Cardiology

## 2023-02-19 ENCOUNTER — Encounter: Payer: Self-pay | Admitting: Cardiology

## 2023-02-19 VITALS — BP 129/77 | HR 77 | Ht 68.0 in | Wt 199.0 lb

## 2023-02-19 DIAGNOSIS — E118 Type 2 diabetes mellitus with unspecified complications: Secondary | ICD-10-CM

## 2023-02-19 DIAGNOSIS — I5022 Chronic systolic (congestive) heart failure: Secondary | ICD-10-CM

## 2023-02-19 DIAGNOSIS — I2510 Atherosclerotic heart disease of native coronary artery without angina pectoris: Secondary | ICD-10-CM

## 2023-02-19 DIAGNOSIS — I493 Ventricular premature depolarization: Secondary | ICD-10-CM | POA: Diagnosis not present

## 2023-02-19 MED ORDER — ENTRESTO 49-51 MG PO TABS: 2.0000 | ORAL_TABLET | Freq: Two times a day (BID) | ORAL | 11 refills | Status: DC

## 2023-02-19 NOTE — Patient Instructions (Signed)
Medication Instructions:  Start taking entresto 49/51 two tablets twice per day. New script sent. *If you need a refill on your cardiac medications before your next appointment, please call your pharmacy*   Lab Work: CBC, CMET today. If you have labs (blood work) drawn today and your tests are completely normal, you will receive your results only by: MyChart Message (if you have MyChart) OR A paper copy in the mail If you have any lab test that is abnormal or we need to change your treatment, we will call you to review the results.   Follow-Up: At [facility], you and your health needs are our priority.  As part of our continuing mission to provide you with exceptional heart care, we have created designated Provider Care Teams.  These Care Teams include your primary Cardiologist (physician) and Advanced Practice Providers (APPs -  Physician Assistants and Nurse Practitioners) who all work together to provide you with the care you need, when you need it.  Your next appointment:   4 month(s)  Provider:   Rollene Rotunda, MD

## 2023-02-20 LAB — COMPREHENSIVE METABOLIC PANEL
ALT: 47 [IU]/L — ABNORMAL HIGH (ref 0–44)
AST: 42 [IU]/L — ABNORMAL HIGH (ref 0–40)
Albumin: 4.4 g/dL (ref 3.8–4.8)
Alkaline Phosphatase: 129 [IU]/L — ABNORMAL HIGH (ref 44–121)
BUN/Creatinine Ratio: 15 (ref 10–24)
BUN: 17 mg/dL (ref 8–27)
Bilirubin Total: 0.4 mg/dL (ref 0.0–1.2)
CO2: 22 mmol/L (ref 20–29)
Calcium: 9.4 mg/dL (ref 8.6–10.2)
Chloride: 103 mmol/L (ref 96–106)
Creatinine, Ser: 1.11 mg/dL (ref 0.76–1.27)
Globulin, Total: 2.9 g/dL (ref 1.5–4.5)
Glucose: 89 mg/dL (ref 70–99)
Potassium: 4.3 mmol/L (ref 3.5–5.2)
Sodium: 142 mmol/L (ref 134–144)
Total Protein: 7.3 g/dL (ref 6.0–8.5)
eGFR: 69 mL/min/{1.73_m2} (ref >59)

## 2023-02-20 LAB — CBC
Hematocrit: 43.8 % (ref 37.5–51.0)
Hemoglobin: 13.8 g/dL (ref 13.0–17.7)
MCH: 28 pg (ref 26.6–33.0)
MCHC: 31.5 g/dL (ref 31.5–35.7)
MCV: 89 fL (ref 79–97)
Platelets: 165 10*3/uL (ref 150–450)
RBC: 4.92 x10E6/uL (ref 4.14–5.80)
RDW: 13.8 % (ref 11.6–15.4)
WBC: 5.5 10*3/uL (ref 3.4–10.8)

## 2023-02-27 ENCOUNTER — Encounter (HOSPITAL_COMMUNITY): Payer: Self-pay

## 2023-03-06 ENCOUNTER — Telehealth: Payer: Self-pay

## 2023-03-06 ENCOUNTER — Other Ambulatory Visit (HOSPITAL_COMMUNITY): Payer: Self-pay

## 2023-03-06 DIAGNOSIS — I5020 Unspecified systolic (congestive) heart failure: Secondary | ICD-10-CM

## 2023-03-06 NOTE — Telephone Encounter (Signed)
 Pharmacy Patient Advocate Encounter   Received notification from CoverMyMeds that prior authorization for ENTRESTO  is required/requested.   Insurance verification completed.   The patient is insured through [facility] .   Per test claim: PA required; PA submitted to above mentioned insurance via CoverMyMeds Key/confirmation #/EOC [id number] Status is pending

## 2023-03-11 ENCOUNTER — Other Ambulatory Visit (HOSPITAL_COMMUNITY): Payer: Self-pay

## 2023-03-11 MED ORDER — ENTRESTO 97-103 MG PO TABS: 1.0000 | ORAL_TABLET | Freq: Two times a day (BID) | ORAL | 5 refills | Status: DC

## 2023-03-11 NOTE — Telephone Encounter (Signed)
 Patient's plan will not pay for Entrsto 49-51mg  x 2 tabs BID. Switching to 97-103mg  BID

## 2023-03-13 ENCOUNTER — Other Ambulatory Visit (HOSPITAL_COMMUNITY): Payer: Self-pay

## 2023-03-23 ENCOUNTER — Telehealth (HOSPITAL_COMMUNITY): Payer: Self-pay | Admitting: *Deleted

## 2023-03-23 ENCOUNTER — Telehealth: Payer: Self-pay

## 2023-03-23 NOTE — Telephone Encounter (Signed)
Attempted to call patient regarding upcoming cardiac PET appointment. Left message on voicemail with name and callback number  Oza Oberle RN Navigator Cardiac Imaging [facility] [phone] Office [phone] Cell  

## 2023-03-23 NOTE — Telephone Encounter (Signed)
Patient returning call about his upcoming cardiac imaging study; pt verbalizes understanding of appt date/time, parking situation and where to check in, pre-test NPO status and verified current allergies; name and call back number provided for further questions should they arise  Larey Brick RN Navigator Cardiac Imaging [facility] [phone] office [phone] cell  Patient verbalized understanding of diet prep.

## 2023-03-25 ENCOUNTER — Encounter (HOSPITAL_COMMUNITY)
Admission: RE | Admit: 2023-03-25 | Discharge: 2023-03-25 | Disposition: A | Discharge status: Home / Self Care | Payer: Medicare Other | Source: Ambulatory Visit | Attending: Cardiology | Admitting: Cardiology

## 2023-05-18 ENCOUNTER — Telehealth (HOSPITAL_COMMUNITY): Payer: Self-pay | Admitting: *Deleted

## 2023-05-18 NOTE — Telephone Encounter (Signed)
 Reaching out to patient to offer assistance regarding upcoming cardiac imaging study; pt verbalizes understanding of appt date/time, parking situation and where to check in, pre-test NPO status and medications ordered, and verified current allergies; name and call back number provided for further questions should they arise Johney Frame RN Navigator Cardiac Imaging [facility] [phone] office [phone] cell   Patient verbalized understanding of diet prep.

## 2023-05-20 ENCOUNTER — Encounter (HOSPITAL_BASED_OUTPATIENT_CLINIC_OR_DEPARTMENT_OTHER)
Admission: RE | Admit: 2023-05-20 | Discharge: 2023-05-20 | Disposition: A | Discharge status: Home / Self Care | Payer: Medicare Other | Source: Ambulatory Visit | Attending: Cardiology | Admitting: Cardiology

## 2023-05-20 DIAGNOSIS — I2510 Atherosclerotic heart disease of native coronary artery without angina pectoris: Secondary | ICD-10-CM | POA: Diagnosis not present

## 2023-05-20 DIAGNOSIS — I428 Other cardiomyopathies: Secondary | ICD-10-CM | POA: Diagnosis not present

## 2023-05-20 DIAGNOSIS — Z7984 Long term (current) use of oral hypoglycemic drugs: Secondary | ICD-10-CM | POA: Diagnosis not present

## 2023-05-20 DIAGNOSIS — I493 Ventricular premature depolarization: Secondary | ICD-10-CM | POA: Diagnosis not present

## 2023-05-20 DIAGNOSIS — I080 Rheumatic disorders of both mitral and aortic valves: Secondary | ICD-10-CM | POA: Diagnosis not present

## 2023-05-20 DIAGNOSIS — Z79899 Other long term (current) drug therapy: Secondary | ICD-10-CM | POA: Diagnosis not present

## 2023-05-20 DIAGNOSIS — R0602 Shortness of breath: Secondary | ICD-10-CM | POA: Diagnosis not present

## 2023-05-20 DIAGNOSIS — I5022 Chronic systolic (congestive) heart failure: Secondary | ICD-10-CM | POA: Diagnosis not present

## 2023-05-20 DIAGNOSIS — E119 Type 2 diabetes mellitus without complications: Secondary | ICD-10-CM | POA: Diagnosis not present

## 2023-05-20 DIAGNOSIS — E875 Hyperkalemia: Secondary | ICD-10-CM | POA: Diagnosis not present

## 2023-05-20 DIAGNOSIS — I110 Hypertensive heart disease with heart failure: Secondary | ICD-10-CM | POA: Diagnosis not present

## 2023-05-20 LAB — NM PET CT MYOCARDIAL SARCOIDOSIS
Nuc Stress EF: 36 %
Rest Nuclear Isotope Dose: 23.3 mCi

## 2023-05-22 ENCOUNTER — Ambulatory Visit (HOSPITAL_COMMUNITY)
Admission: RE | Admit: 2023-05-22 | Discharge: 2023-05-22 | Disposition: A | Discharge status: Home / Self Care | Payer: Medicare Other | Source: Ambulatory Visit | Attending: Cardiology | Admitting: Cardiology

## 2023-05-22 ENCOUNTER — Encounter (HOSPITAL_COMMUNITY): Payer: Self-pay | Admitting: Cardiology

## 2023-05-22 ENCOUNTER — Ambulatory Visit (HOSPITAL_BASED_OUTPATIENT_CLINIC_OR_DEPARTMENT_OTHER)
Admission: RE | Admit: 2023-05-22 | Discharge: 2023-05-22 | Disposition: A | Discharge status: Home / Self Care | Payer: Medicare Other | Source: Ambulatory Visit | Attending: Cardiology | Admitting: Cardiology

## 2023-05-22 VITALS — BP 148/76 | HR 79 | Ht 68.0 in | Wt 194.2 lb

## 2023-05-22 DIAGNOSIS — I080 Rheumatic disorders of both mitral and aortic valves: Secondary | ICD-10-CM | POA: Insufficient documentation

## 2023-05-22 DIAGNOSIS — I10 Essential (primary) hypertension: Secondary | ICD-10-CM

## 2023-05-22 DIAGNOSIS — I493 Ventricular premature depolarization: Secondary | ICD-10-CM | POA: Diagnosis not present

## 2023-05-22 DIAGNOSIS — I428 Other cardiomyopathies: Secondary | ICD-10-CM | POA: Insufficient documentation

## 2023-05-22 DIAGNOSIS — I340 Nonrheumatic mitral (valve) insufficiency: Secondary | ICD-10-CM | POA: Diagnosis not present

## 2023-05-22 DIAGNOSIS — I110 Hypertensive heart disease with heart failure: Secondary | ICD-10-CM | POA: Insufficient documentation

## 2023-05-22 DIAGNOSIS — E875 Hyperkalemia: Secondary | ICD-10-CM | POA: Insufficient documentation

## 2023-05-22 DIAGNOSIS — I5022 Chronic systolic (congestive) heart failure: Secondary | ICD-10-CM

## 2023-05-22 DIAGNOSIS — I2510 Atherosclerotic heart disease of native coronary artery without angina pectoris: Secondary | ICD-10-CM

## 2023-05-22 DIAGNOSIS — E119 Type 2 diabetes mellitus without complications: Secondary | ICD-10-CM | POA: Insufficient documentation

## 2023-05-22 DIAGNOSIS — E118 Type 2 diabetes mellitus with unspecified complications: Secondary | ICD-10-CM | POA: Diagnosis not present

## 2023-05-22 DIAGNOSIS — Z7984 Long term (current) use of oral hypoglycemic drugs: Secondary | ICD-10-CM | POA: Insufficient documentation

## 2023-05-22 DIAGNOSIS — Z79899 Other long term (current) drug therapy: Secondary | ICD-10-CM | POA: Insufficient documentation

## 2023-05-22 DIAGNOSIS — R0602 Shortness of breath: Secondary | ICD-10-CM | POA: Insufficient documentation

## 2023-05-22 LAB — ECHOCARDIOGRAM COMPLETE
AR max vel: 1.35 cm2
AV Area VTI: 1.44 cm2
AV Area mean vel: 1.36 cm2
AV Mean grad: 10 mmHg
AV Peak grad: 18.3 mmHg
Ao pk vel: 2.14 m/s
Area-P 1/2: 3.42 cm2
Calc EF: 37.7 %
MV M vel: 5.18 m/s
MV Peak grad: 107.3 mmHg
P 1/2 time: 477 ms
S' Lateral: 4.9 cm
Single Plane A2C EF: 39.8 %
Single Plane A4C EF: 37.7 %

## 2023-05-22 LAB — BASIC METABOLIC PANEL
Anion gap: 11 (ref 5–15)
BUN: 10 mg/dL (ref 8–23)
CO2: 27 mmol/L (ref 22–32)
Calcium: 9.2 mg/dL (ref 8.9–10.3)
Chloride: 102 mmol/L (ref 98–111)
Creatinine, Ser: 0.9 mg/dL (ref 0.61–1.24)
GFR, Estimated: >60 mL/min (ref >60)
Glucose, Bld: 125 mg/dL — ABNORMAL HIGH (ref 70–99)
Potassium: 3.3 mmol/L — ABNORMAL LOW (ref 3.5–5.1)
Sodium: 140 mmol/L (ref 135–145)

## 2023-05-22 LAB — BRAIN NATRIURETIC PEPTIDE: B Natriuretic Peptide: 771.2 pg/mL — ABNORMAL HIGH (ref 0.0–100.0)

## 2023-05-22 MED ORDER — AMLODIPINE BESYLATE 5 MG PO TABS: 5.0000 mg | ORAL_TABLET | Freq: Every day | ORAL | 3 refills | Status: DC

## 2023-05-22 MED ORDER — CARVEDILOL 6.25 MG PO TABS: 6.2500 mg | ORAL_TABLET | Freq: Two times a day (BID) | ORAL | 3 refills | Status: DC

## 2023-05-22 NOTE — Progress Notes (Signed)
 ADVANCED HEART FAILURE CLINIC NOTE  Referring Physician: Alysia Penna, MD  Primary Care: Alysia Penna, MD Primary Cardiologist: Dr. Antoine Poche EP: Dr. Elberta Fortis HF: Gasper Lloyd  HPI: Arthur Klein is a 78 y.o. 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  His cardiac history dates back to at least 2022 with echocardiogram demonstrating EF of 40 to 45% at that time.  In early 2023 he wore a Zio patch with 28% PVC burden; started on flecainide with improvement in PVC burden to 4.9% on repeat monitor in October 2023.  He was admitted in April 2024 with acute on chronic heart failure exacerbation; echocardiogram at that time with a EF of 20 to 25%.  Right and left heart catheterization with nonobstructive CAD and cardiac index of 2.47. He was diuresed and started on GDMT. He had a repeat ziopatch from  07/16/22-07/23/22 w/ a 28% PVC burden. Unsuccessful ablation in 7/24 in  Now he is on amiodarone 200mg  BID and present for follow up.   Interval hx:  -Over the past several months he has been stable; however, does report some increase in dyspnea and fatigue.  - TTE today with stable LVEF; however, there is at least moderate MR.  - SBP at home in the 130s; can be higher at times.  - Very motivated to improve his health - He feels that he can no longer walk around the neighborhood due to dyspnea.   Activity level/exercise tolerance:  NYHA IIB Orthopnea:  Sleeps on 2 pillows Paroxysmal noctural dyspnea:  No Chest pain/pressure:  No Orthostatic lightheadedness:  No Palpitations:  minimal Lower extremity edema: No Presyncope/syncope:  No Cough:  No   Current Outpatient Medications  Medication Sig Dispense Refill   acetaminophen (TYLENOL) 500 MG tablet Take 500-1,000 mg by mouth every 6 (six) hours as needed for fever (for pain.).      B Complex Vitamins (VITAMIN B COMPLEX) TABS Take 1 tablet by mouth daily.      chlorhexidine (PERIDEX) 0.12 % solution SMARTSIG:1 Capful(s) By Mouth Twice Daily     Cholecalciferol (VITAMIN D-3) 1000 UNITS CAPS Take 1,000 Units by mouth daily.      colchicine 0.6 MG tablet Take 0.6 mg by mouth 2 (two) times daily as needed.     dapagliflozin propanediol (FARXIGA) 10 MG TABS tablet Take 1 tablet (10 mg total) by mouth daily. 30 tablet 11   diphenhydrAMINE (BENADRYL) 12.5 MG/5ML elixir Take 3.125 mg by mouth 4 (four) times daily as needed (for allergies.).     furosemide (LASIX) 20 MG tablet Take 1 tablet (20 mg total) by mouth as needed for edema (weight gain 3lbs in 1 day or 5lbs in 1 week). 30 tablet 0   metoprolol succinate (TOPROL XL) 25 MG 24 hr tablet Take 1 tablet (25 mg total) by mouth at bedtime. 90 tablet 3   Multiple Vitamins-Minerals (ICAPS AREDS 2 PO) Take 1 capsule by mouth daily.     Nutritional Supplements (JUICE PLUS FIBRE PO) Take 2 tablets by mouth daily. Vegetable Fruit     PRESCRIPTION MEDICATION Take 2,000 mg by mouth once. Penicillin 500 mg each     Probiotic Product (PROBIOTIC DAILY PO) Take 1 tablet by mouth every morning.     sacubitril-valsartan (ENTRESTO) 97-103 MG Take 1 tablet by mouth 2 (two) times daily. 60 tablet 5   amiodarone (PACERONE) 200 MG tablet Take 1 tablet (200 mg total) by mouth daily. (Patient not taking:  Reported on 05/22/2023) 60 tablet 3   No current facility-administered medications for this encounter.   PHYSICAL EXAM: Vitals:   05/22/23 1431  BP: (!) 148/76  Pulse: 79  SpO2: 97%   GENERAL: NAD Lungs- CTA CARDIAC:  JVP: 6 cm          Normal rate with regular rhythm. 3-4/6 systolic murmur.  Pulses 2+. No edema.  ABDOMEN: Soft, non-tender, non-distended.  EXTREMITIES: Warm and well perfused.  NEUROLOGIC: No obvious FND    DATA REVIEW  ECG: 08/06/22: NSR with frequent PVCs  ECHO: 06/24/22: LVEF 20-25%, normal RV function as per my interpretation  CATH: 06/25/22: Diffuse ectasia of the RCA and LCx Moderate to  severe concentric calcification in the proximal to mid LAD with mild to moderate concentric calcification in the proximal to mid LCx and RCA Most notable lesion is a 50% proximal RCA lesion and a bend between ectatic segments, otherwise moderate disease in OM1 and D2.  No flow-limiting lesions. Normal right heart cath pressures with LV EDP and PCWP of 6 to 9 mmHg.  Mean PAP 15 mmHg. Cardiac Output and Index: 5.01-2.47  CMR: 1. Severe LVE with global hypokinesis LVEF 28%  2. Diffuse mid myocardial gadolinium uptake at base with small area of scar in the most basal anterior septum under the aortic annulus  3.  Mild decrease in RV function RVEF 37%  4. 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 Normal T2 suggests no active inflammation or myocarditis  4.  Preserved cardiac output 4.2 L/min  5.  Mild appearing MR  6.  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 Will obtain PET-CT to evaluate for cardiac sarcoid. 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 PET-CT pending.  NYHA class / AHA Stage:NYHA IIB-III Volume status & Diuretics: Euvolemic  Vasodilators: Increase entresto to 97/103mg BID. Start amlodipine 5mg  BID.  Beta-Blocker: D/C toprol; start coreg 6.25mg  BID KGM:[id number]; will hold for now. Repeat BMP/BNP today.  Cardiometabolic: continue farxiga 10mg  daily.  Devices therapies & Valvulopathies: repeat TTE with LVEF 35%-40%; not a candidate for ICD.  Advanced therapies:Not currently a candidate. CPX with mild functional limitation.   2. Shortness of breath  - He has at least moderate MR on TTE; on exam I can auscultate a 4/6 SM - Will work on afterload reduction; transition toprol to coreg.   - start amlodipine 5mg  daily - He also has underlying CAD; will refer to Dr. Herbie Baltimore for possible LHC; can obtain RHC at that time also. Low suspicion but will allow Dr. Herbie Baltimore to see him again.   3. Frequent PVC - continue amiodarone 200mg  Bid; followed by Dr. Ladona Ridgel - 5/28 ziopatch with 28% PVC burden - S/P PVC ablation on 09/22/22 unsuccessful.  - Reviewed ziopatch from 9/24; rare PVCs.   4. 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 Will attempt to uptitrate to daily dosing.  - Doing very well; does not wish to increase lipitor at this time.  - No chest pain, however, becoming very short of breath.   5. Borderline DM -Most recent A1c 5.9% -Unable to afford semaglutide -continue farxiga  6. Hypertension  - BP ranges around 130s/90s  7. Mitral regurgitation  - Moderate by TTE today on 05/22/23 - Will focus on afterload reduction today.   I spent 40 minutes caring for this patient today including face to face time, ordering and reviewing labs, reviewing cath from 06/25/22, reviewing echocardiogram today from him, seeing the patient, documenting in the record, and arranging follow ups.   Danajah Birdsell Advanced Heart Failure Mechanical Circulatory Support

## 2023-05-22 NOTE — Patient Instructions (Signed)
 Medication Changes:  STOP METOPROLOL SUCCINATE   START: CARVEDILOL 6.25MG  TWICE DAILY   WAIT 2 DAYS AND IF BLOOD PRESSURE STILL ELEVATED START AMLODIPINE 5MG  ONCE DAILY   Lab Work:  Labs done today, your results will be available in MyChart, we will contact you for abnormal readings.  Referrals:  PLEASE CALL AND SCHEDULE FOLLOW UP WITH DR. HARDING [phone]  Follow-Up in: 2 MONTHS AS SCHEDULED   THEN AGAIN 4 MONTHS PLEASE CALL OUR OFFICE AROUND MAY 2025 TO GET SCHEDULED FOR YOUR APPOINTMENT. PHONE NUMBER IS [phone] OPTION 2   At the Advanced [facility] Failure Clinic, you and your health needs are our priority. We have a designated team specialized in the treatment of Heart Failure. This Care Team includes your primary Heart Failure Specialized Cardiologist (physician), Advanced Practice Providers (APPs- Physician Assistants and Nurse Practitioners), and Pharmacist who all work together to provide you with the care you need, when you need it.   You may see any of the following providers on your designated Care Team at your next follow up:  Dr. Arvilla Meres Dr. Marca Ancona Dr. Dorthula Nettles Dr. Theresia Bough Tonye Becket, NP Robbie Lis, [place] [facility] [place], [place] Brynda Peon, NP [place] Lee, NP Karle Plumber, PharmD   Please be sure to bring in all your medications bottles to every appointment.   Need to Contact us:  If you have any questions or concerns before your next appointment please send [place] a message through [place] or call our office at [phone].    TO LEAVE A MESSAGE FOR THE NURSE SELECT OPTION 2, PLEASE LEAVE A MESSAGE INCLUDING: YOUR NAME DATE OF BIRTH CALL BACK NUMBER REASON FOR CALL**this is important as we prioritize the call backs  YOU WILL RECEIVE A CALL BACK THE SAME DAY AS LONG AS YOU CALL BEFORE 4:00 PM

## 2023-06-12 ENCOUNTER — Ambulatory Visit: Payer: Medicare Other | Admitting: Cardiology

## 2023-07-01 DIAGNOSIS — I5022 Chronic systolic (congestive) heart failure: Secondary | ICD-10-CM | POA: Insufficient documentation

## 2023-07-01 NOTE — Progress Notes (Signed)
 Cardiology Office Note:   Date:  07/02/2023  ID:  Arthur Klein, DOB [birth year], MRN [id number] PCP: Barnetta Liberty, MD  [facility] Providers Cardiologist:  Eilleen Grates, MD Electrophysiologist:  Will Cortland Ding, MD {  History of Present Illness:   Arthur Klein is a 78 y.o. male who presents for evaluation of bradycardia.  The patient recently was noted to have palpitations and an abnormal EKG.  He previously had PVCs noted in 2017.  At that time he had an abnormal stress test suggesting reduced ejection fraction.  There was no ischemia or infarction.  A month later follow-up echo demonstrated EF of 50 - 55%.      In Dec 2022 Echo suggested that the EF was slightly lower than previous at 40 - 45% so I changed him to Entresto .  He has had some episodes of dizziness and had a monitor with runs of NSVT.  This was in Feb 2023.   He had a perfusion study to rule out obstructive CAD.  He had no ischemia.  He did have an appt with Dr. Lawana Pray as he had a high burden of ventricular ectopy on a monitor and was started on flecainide .   Monitor demonstrated a significantly reduced PVC burden. When he saw Dr. Lawana Pray in Apirl 2024 he held the Flecainide  for a week to see if his SOB improved.  He also failed amiodarone  because of some hand swelling and eventually had attempted ablation which was not reported to be successful.  I read this note from Dr. Carolynne Citron.  There is difficulty mapping and difficulty getting across the aortic valve.   Hisecho in April 2024 demonstrated that his EF was down to 20 - 25%.  He had a cardiac cath with some mild non obstructive disease.   His right heart pressures were not markedly abnormal.   He wore a monitor and had PVCs 28% burden.    He has seen Dr. Bruce Caper and had an MRI with EF 28% and RV dysfunction as well.  PET did not suggest sarcoid.  CPX had mild limitations.  He had Moderate MR.   I reviewed notes extensively from EP and advanced heart failure.  The  patient has a very limited understanding of cardiac issues but remembers conversations and has had about possibility of an ICD implant or mechanical treatment of his mitral regurgitation.  His issue is he continues to have progressive fatigue.  He is actually had some leg weakness and falls when he is walking around in the yard.  He has some chronic dyspnea but he is not having any overt PND or orthopnea.  He is not having any new palpitations, presyncope or syncope that he can feel.  He has had no weight gain and no new edema.  He has had a general decline.  Of note he was not clear the medicines he was taking in the middle his wife is a retired Engineer, civil (consulting) they were able to clarify.  He is not taking amiodarone  which was previously discontinued.  He is apparently not taking carvedilol  which she was supposed to be taking.  He is not sure the dose of Entresto  which I believed to be 97/103.  ROS: As stated in the HPI and negative for all other systems.  Studies Reviewed:    EKG:   EKG Interpretation Date/Time:  Thursday Jul 02 2023 14:56:13 EDT Ventricular Rate:  72 PR Interval:  158 QRS Duration:  88 QT Interval:  422 QTC Calculation: 462 R Axis:  11  Text Interpretation: Sinus rhythm with occasional Premature ventricular complexes Borderline IVCD When compared with ECG of 24-Dec-2022 14:34, No significant change since last tracing Confirmed by Eilleen Grates ([id number]) on 07/02/2023 5:41:47 PM   Risk Assessment/Calculations:              Physical Exam:   VS:  BP 108/63   Pulse 72   Ht 5\' 8"  (1.727 m)   Wt 190 lb 8 oz (86.4 kg)   SpO2 95%   BMI 28.97 kg/m    Wt Readings from Last 3 Encounters:  07/02/23 190 lb 8 oz (86.4 kg)  05/22/23 194 lb 3.2 oz (88.1 kg)  02/19/23 199 lb (90.3 kg)     GEN: Well nourished, well developed in no acute distress NECK: No JVD; No carotid bruits CARDIAC: Regular RR, 2 out of 6 apical and axillary systolic murmur, no diastolic murmurs, rubs,  gallops RESPIRATORY:  Clear to auscultation without rales, wheezing or rhonchi  ABDOMEN: Soft, non-tender, non-distended EXTREMITIES:  No edema; No deformity   ASSESSMENT AND PLAN:   Heart failure with reduced EF: The etiology of this is still not clear.  He is not completely compliant with all of his medications in particular is not taking the beta-blocker.  I think he is taking the Entresto .  He does have mitral regurgitation but at this point is not clear that repair of this would improve him clinically.  I will discuss this further with Dr. Bruce Caper.  I am concerned still that the frequent PVCs are the etiology of his cardiomyopathy and we will discuss this again with Dr.  Lawana Pray as to whether there are any other considerations for attempts at ablation.  He is not in need of any advanced therapies.  Difficult to titrate his medications when he is not compliant with the ones he is given.  At this point I will continue the meds as listed and confer with my colleagues.    Frequent PVC: As above.  CAD: He had nonobstructive coronary disease previously.  No change in therapy.   Borderline DM: A1c was 6.7.  He was unable to afford semaglutide.  He is taking Farxiga .  Follow-up per his primary provider.  Dyslipidemia: LDL was 93.  He did not want to increase his statin previously.       Follow up with me in two months.   Signed, Eilleen Grates, MD

## 2023-07-02 ENCOUNTER — Ambulatory Visit: Payer: Medicare Other | Attending: Cardiology | Admitting: Cardiology

## 2023-07-02 ENCOUNTER — Encounter: Payer: Self-pay | Admitting: Cardiology

## 2023-07-02 VITALS — BP 108/63 | HR 72 | Ht 68.0 in | Wt 190.5 lb

## 2023-07-02 DIAGNOSIS — I2510 Atherosclerotic heart disease of native coronary artery without angina pectoris: Secondary | ICD-10-CM

## 2023-07-02 DIAGNOSIS — E118 Type 2 diabetes mellitus with unspecified complications: Secondary | ICD-10-CM | POA: Diagnosis not present

## 2023-07-02 DIAGNOSIS — I493 Ventricular premature depolarization: Secondary | ICD-10-CM

## 2023-07-02 DIAGNOSIS — I5022 Chronic systolic (congestive) heart failure: Secondary | ICD-10-CM | POA: Diagnosis not present

## 2023-07-02 DIAGNOSIS — E785 Hyperlipidemia, unspecified: Secondary | ICD-10-CM

## 2023-07-02 NOTE — Patient Instructions (Addendum)
 Medication Instructions:   -STOP TAKING  AMLODIPINE   - STOP TAKING AMIODARONE    Your physician recommends that you continue on your current medications as directed. Please refer to the Current Medication list given to you today.  Please call us  when you get home to update your medication list.    *If you need a refill on your cardiac medications before your next appointment, please call your pharmacy*   Lab Work: None    If you have labs (blood work) drawn today and your tests are completely normal, you will receive your results only by: MyChart Message (if you have MyChart) OR A paper copy in the mail If you have any lab test that is abnormal or we need to change your treatment, we will call you to review the results.   Testing/Procedures: None    Follow-Up: At [facility], you and your health needs are our priority.  As part of our continuing mission to provide you with exceptional heart care, we have created designated Provider Care Teams.  These Care Teams include your primary Cardiologist (physician) and Advanced Practice Providers (APPs -  Physician Assistants and Nurse Practitioners) who all work together to provide you with the care you need, when you need it.  We recommend signing up for the patient portal called "MyChart".  Sign up information is provided on this After Visit Summary.  MyChart is used to connect with patients for Virtual Visits (Telemedicine).  Patients are able to view lab/test results, encounter notes, upcoming appointments, etc.  Non-urgent messages can be sent to your provider as well.   To learn more about what you can do with MyChart, go to [email or web address].    Your next appointment:   2 month(s)  The format for your next appointment:   In Person  Provider:    Eilleen Grates, MD  Other Instructions

## 2023-07-21 ENCOUNTER — Telehealth (HOSPITAL_COMMUNITY): Payer: Self-pay

## 2023-07-21 NOTE — Progress Notes (Addendum)
 ADVANCED HEART FAILURE CLINIC NOTE  Primary Care: Barnetta Liberty, MD Primary Cardiologist: Dr. Lavonne Prairie EP: Dr. Lawana Pray HF Cardiologist: Bruce Caper  HPI: Arthur Klein is a 78 y.o. 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  In early 2023 he wore a Zio patch with 28% PVC burden; started on flecainide  with improvement in PVC burden to 4.9% on repeat monitor in October 2023.  Admitted in 4/24 with acute on chronic heart failure exacerbation; echo showed EF of 20 to 25%.  Right and left heart catheterization with nonobstructive CAD and CI of 2.47. He was diuresed and started on GDMT. Repeat zio patch from  07/16/22-07/23/22 w/ a 28% PVC burden. S/p unsuccessful ablation in 7/24, now on amio 200 bid.  Echo 3/25 EF 40-45%, normal RV, moderate MR, mild to mod AS with mean gradient 10 mmHg.  Interval hx:  Today he returns for HF follow up with his wife. He is SOB walking > 150 feet on flat ground. He is frustrated. Having frequent falls, most recent occurred when standing up to get off lawnmower and ended up on the ground, does not have re-collection of how is happened. Wife is worried. Denies palpitations, abnormal bleeding, CP, edema, or PND/Orthopnea. Appetite ok. Weight at home 189 pounds. He does not know what meds he is taking. Has not needed Lasix .   Activity level/exercise tolerance: worsening NYHA III-IIIb Orthopnea:  Sleeps on 2 pillows Paroxysmal noctural dyspnea:  No Chest pain/pressure:  No Orthostatic lightheadedness:  Yes Palpitations: No Lower extremity edema: No Presyncope/syncope:  Yes Cough:  No  Current Outpatient Medications  Medication Sig Dispense Refill   acetaminophen  (TYLENOL ) 500 MG tablet Take 500-1,000 mg by mouth every 6 (six) hours as needed for fever (for pain.).      B Complex Vitamins (VITAMIN B  COMPLEX) TABS Take 1 tablet by mouth daily.     carvedilol  (COREG ) 6.25 MG tablet Take 1 tablet (6.25 mg total) by mouth 2 (two) times daily. 60 tablet 3   chlorhexidine  (PERIDEX ) 0.12 % solution SMARTSIG:1 Capful(s) By Mouth Twice Daily     Cholecalciferol  (VITAMIN D -3) 1000 UNITS CAPS Take 1,000 Units by mouth daily.      colchicine  0.6 MG tablet Take 0.6 mg by mouth 2 (two) times daily as needed.     dapagliflozin  propanediol (FARXIGA ) 10 MG TABS tablet Take 1 tablet (10 mg total) by mouth daily. 30 tablet 11   diphenhydrAMINE  (BENADRYL ) 12.5 MG/5ML elixir Take 3.125 mg by mouth 4 (four) times daily as needed (for allergies.).     furosemide  (LASIX ) 20 MG tablet Take 1 tablet (20 mg total) by mouth as needed for edema (weight gain 3lbs in 1 day or 5lbs in 1 week). 30 tablet 0   Multiple Vitamins-Minerals (ICAPS AREDS 2 PO) Take 1 capsule by mouth daily.     Nutritional Supplements (JUICE PLUS FIBRE PO) Take 2 tablets by mouth daily. Vegetable Fruit     PRESCRIPTION MEDICATION Take 2,000 mg by mouth once. Penicillin 500 mg each     Probiotic Product (PROBIOTIC DAILY PO) Take 1 tablet by mouth every morning.     sacubitril -valsartan  (ENTRESTO ) 97-103 MG Take 1 tablet by mouth 2 (two) times daily. 60 tablet 5   No current facility-administered medications for this encounter.   Wt Readings from Last 3 Encounters:  07/22/23 87.1 kg (192  lb)  07/02/23 86.4 kg (190 lb 8 oz)  05/22/23 88.1 kg (194 lb 3.2 oz)   BP 106/64   Pulse 64   Ht 5\' 8"  (1.727 m)   Wt 87.1 kg (192 lb)   SpO2 96%   BMI 29.19 kg/m   PHYSICAL EXAM: General:  NAD. No resp difficulty, walked into clinic, elderly HEENT: Normal Neck: Supple. JVP 10 Cor: irregular rate (PVCs) & rhythm. No rubs, gallops, 3/6 SEM Lungs: Clear Abdomen: Soft, nontender, nondistended.  Extremities: No cyanosis, clubbing, rash, edema Neuro: Alert & oriented x 3, moves all 4 extremities w/o difficulty. Affect pleasant.  ReDs reading: 41 %,  abnormal  Orthostatics today 07/22/23 Lying 130/56, HR 65 Sitting 128/80, HR 63 Standing 128/84, HR 73  DATA REVIEW  ECG: 08/06/22: NSR with frequent PVCs 07/22/23: NSR with PVC (personally reviewed)  ECHO: 06/24/22: LVEF 20-25%, normal RV function   3/25: EF 40-45%, normal RV, moderate MR, mild to mod AS with mean gradient 10 mmHg  CATH: 06/25/22: Diffuse ectasia of the RCA and LCx Moderate to severe concentric calcification in the proximal to mid LAD with mild to moderate concentric calcification in the proximal to mid LCx and RCA Most notable lesion is a 50% proximal RCA lesion and a bend between ectatic segments, otherwise moderate disease in OM1 and D2.  No flow-limiting lesions. Normal right heart cath pressures with LV EDP and PCWP of 6 to 9 mmHg.  Mean PAP 15 mmHg. Cardiac Output and Index: 5.01-2.47  CMR: 1. Severe LVE with global hypokinesis LVEF 28%  2. Diffuse mid myocardial gadolinium uptake at base with small area of scar in the most basal anterior septum under the aortic annulus  3.  Mild decrease in RV function RVEF 37%  4. 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 Normal T2 suggests no active inflammation or myocarditis  4.  Preserved cardiac output 4.2 L/min  5.  Mild appearing MR  6.  Moderate LAE  Cardiac PET 5/25 - no active myocardial inflammation/sarcoidosis, LVEF 36%  ASSESSMENT & PLAN:  Heart failure with reduced EF Etiology of MV:HQIONGEXBMW cardiomyopathy; LHC/RHC above. 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  In 2013 noted to have pulmonary nodule on chest CT; CT chest 8/24 not suggestive of pulmonary sarcoidosis, did show stable benign pulmonary nodules. Cardiac PET 3/25 showed no active myocardial inflammation/sarcoidosis. NYHA class / AHA Stage:NYHA III-IIIb Volume status & Diuretics: ReDs 40%, appears mildly hypervolemic on exam, change Lasix   to 20 mg daily.  Vasodilators: Continue Entresto  97/103 mg bid.  Beta-Blocker: Continue Coreg  6.25 mg bid MRA: hyperkalemia; will hold for now with lower BP Cardiometabolic: Continue Farxiga  10 mg daily.  Devices therapies & Valvulopathies: Echo with LVEF 35%-40%; not a candidate for ICD.  Advanced therapies: Not currently a candidate. CPX with mild functional limitation.  - Labs today.  2. Mitral regurgitation  - Moderate by echo 3/25 - With continued dyspnea, repeat echo. - If MR worse, plan for RHC/TEE. Discussed with Dr Bruce Caper  3. CAD - LHC with nonobstructive CAD  - No chest pain, ? Is dyspnea is anginal equivalent - He has stopped his statin for unclear reasons, recommend he re-start  4. Frequent PVCs - Previously on amiodarone , followed by Dr. Carolynne Citron - Zio patch 5/24 with 28% PVC burden  - s/p unsuccessful PVC ablation on 7/24 - Zio patch 9/24; rare PVCs.  - Now off amiodarone  -  HR irrregular today, 1 PVC on ECG - Repeat 2 week Zio to re-quantify burden - Labs today  5. Borderline DM - A1c 5.9% - Unable to afford semaglutide - Continue SGLT2i  6. HTN - BP stable - Continue meds as above  7. Syncope - Orthostatics negative in clinic today, also with known valvule dz - Sounds like orthostasis, but cannot rule out arrhythmogenic cause - Place Live Zio 2 week to look for high-grade arrhythmias (also for PVC burden) - We discussed [place] DMV driving restrictions. - Echo as above - Labs today  Follow up in 2-3 weeks with APP for fluid check. Update echo soon.  Vernia Good, FNP-BC Advanced Heart Failure 07/22/23

## 2023-07-21 NOTE — Telephone Encounter (Signed)
 Called to confirm/remind patient of their appointment at the [facility] on 07/22/23.   Appointment:   [x] Confirmed  [] Left mess   [] No answer/No voice mail  [] VM Full/unable to leave message  [] Phone not in service  Patient reminded to bring all medications and/or complete list.  Confirmed patient has transportation. Gave directions, instructed to utilize valet parking.

## 2023-07-22 ENCOUNTER — Inpatient Hospital Stay (HOSPITAL_COMMUNITY)
Admission: RE | Admit: 2023-07-22 | Discharge: 2023-07-22 | Disposition: A | Discharge status: Home / Self Care | Source: Ambulatory Visit | Attending: Cardiology | Admitting: Cardiology

## 2023-07-22 ENCOUNTER — Encounter (HOSPITAL_COMMUNITY): Payer: Self-pay

## 2023-07-22 ENCOUNTER — Other Ambulatory Visit (HOSPITAL_COMMUNITY): Payer: Self-pay | Admitting: Cardiology

## 2023-07-22 ENCOUNTER — Ambulatory Visit (HOSPITAL_COMMUNITY)
Admission: RE | Admit: 2023-07-22 | Discharge: 2023-07-22 | Disposition: A | Discharge status: Home / Self Care | Source: Ambulatory Visit | Attending: Family Medicine | Admitting: Family Medicine

## 2023-07-22 ENCOUNTER — Ambulatory Visit (HOSPITAL_COMMUNITY): Payer: Self-pay | Admitting: Family Medicine

## 2023-07-22 VITALS — BP 106/64 | HR 64 | Ht 68.0 in | Wt 192.0 lb

## 2023-07-22 DIAGNOSIS — Z7984 Long term (current) use of oral hypoglycemic drugs: Secondary | ICD-10-CM | POA: Diagnosis not present

## 2023-07-22 DIAGNOSIS — I5022 Chronic systolic (congestive) heart failure: Secondary | ICD-10-CM | POA: Diagnosis present

## 2023-07-22 DIAGNOSIS — I110 Hypertensive heart disease with heart failure: Secondary | ICD-10-CM | POA: Insufficient documentation

## 2023-07-22 DIAGNOSIS — Z9181 History of falling: Secondary | ICD-10-CM | POA: Insufficient documentation

## 2023-07-22 DIAGNOSIS — R296 Repeated falls: Secondary | ICD-10-CM | POA: Insufficient documentation

## 2023-07-22 DIAGNOSIS — I493 Ventricular premature depolarization: Secondary | ICD-10-CM

## 2023-07-22 DIAGNOSIS — Z79899 Other long term (current) drug therapy: Secondary | ICD-10-CM | POA: Diagnosis not present

## 2023-07-22 DIAGNOSIS — E875 Hyperkalemia: Secondary | ICD-10-CM | POA: Diagnosis not present

## 2023-07-22 DIAGNOSIS — I340 Nonrheumatic mitral (valve) insufficiency: Secondary | ICD-10-CM | POA: Insufficient documentation

## 2023-07-22 DIAGNOSIS — I25118 Atherosclerotic heart disease of native coronary artery with other forms of angina pectoris: Secondary | ICD-10-CM | POA: Diagnosis not present

## 2023-07-22 DIAGNOSIS — I2510 Atherosclerotic heart disease of native coronary artery without angina pectoris: Secondary | ICD-10-CM | POA: Diagnosis not present

## 2023-07-22 DIAGNOSIS — T466X6A Underdosing of antihyperlipidemic and antiarteriosclerotic drugs, initial encounter: Secondary | ICD-10-CM | POA: Diagnosis not present

## 2023-07-22 DIAGNOSIS — I10 Essential (primary) hypertension: Secondary | ICD-10-CM

## 2023-07-22 DIAGNOSIS — E118 Type 2 diabetes mellitus with unspecified complications: Secondary | ICD-10-CM

## 2023-07-22 DIAGNOSIS — I428 Other cardiomyopathies: Secondary | ICD-10-CM | POA: Insufficient documentation

## 2023-07-22 DIAGNOSIS — E119 Type 2 diabetes mellitus without complications: Secondary | ICD-10-CM | POA: Insufficient documentation

## 2023-07-22 DIAGNOSIS — R42 Dizziness and giddiness: Secondary | ICD-10-CM | POA: Insufficient documentation

## 2023-07-22 DIAGNOSIS — R0601 Orthopnea: Secondary | ICD-10-CM | POA: Diagnosis not present

## 2023-07-22 DIAGNOSIS — R55 Syncope and collapse: Secondary | ICD-10-CM | POA: Diagnosis not present

## 2023-07-22 LAB — BASIC METABOLIC PANEL WITH GFR
Anion gap: 9 (ref 5–15)
BUN: 8 mg/dL (ref 8–23)
CO2: 25 mmol/L (ref 22–32)
Calcium: 9.2 mg/dL (ref 8.9–10.3)
Chloride: 105 mmol/L (ref 98–111)
Creatinine, Ser: 0.9 mg/dL (ref 0.61–1.24)
GFR, Estimated: >60 mL/min (ref >60)
Glucose, Bld: 120 mg/dL — ABNORMAL HIGH (ref 70–99)
Potassium: 4 mmol/L (ref 3.5–5.1)
Sodium: 139 mmol/L (ref 135–145)

## 2023-07-22 LAB — FERRITIN: Ferritin: 61 ng/mL (ref 24–336)

## 2023-07-22 LAB — CBC
HCT: 44.2 % (ref 39.0–52.0)
Hemoglobin: 14 g/dL (ref 13.0–17.0)
MCH: 27.2 pg (ref 26.0–34.0)
MCHC: 31.7 g/dL (ref 30.0–36.0)
MCV: 86 fL (ref 80.0–100.0)
Platelets: 105 10*3/uL — ABNORMAL LOW (ref 150–400)
RBC: 5.14 MIL/uL (ref 4.22–5.81)
RDW: 16.2 % — ABNORMAL HIGH (ref 11.5–15.5)
WBC: 3.4 10*3/uL — ABNORMAL LOW (ref 4.0–10.5)
nRBC: 0 % (ref 0.0–0.2)

## 2023-07-22 LAB — IRON AND TIBC
Iron: 50 ug/dL (ref 45–182)
Saturation Ratios: 14 % — ABNORMAL LOW (ref 17.9–39.5)
TIBC: 371 ug/dL (ref 250–450)
UIBC: 321 ug/dL

## 2023-07-22 LAB — BRAIN NATRIURETIC PEPTIDE: B Natriuretic Peptide: 893.5 pg/mL — ABNORMAL HIGH (ref 0.0–100.0)

## 2023-07-22 MED ORDER — POTASSIUM CHLORIDE CRYS ER 20 MEQ PO TBCR
20.0000 meq | EXTENDED_RELEASE_TABLET | Freq: Every day | ORAL | 5 refills | Status: DC
Start: 2023-07-22 — End: 2023-12-02

## 2023-07-22 MED ORDER — FUROSEMIDE 20 MG PO TABS
20.0000 mg | ORAL_TABLET | Freq: Every day | ORAL | 3 refills | Status: DC
Start: 2023-07-22 — End: 2023-07-22

## 2023-07-22 MED ORDER — FUROSEMIDE 40 MG PO TABS
40.0000 mg | ORAL_TABLET | Freq: Every day | ORAL | 5 refills | Status: DC
Start: 2023-07-22 — End: 2023-12-11

## 2023-07-22 NOTE — Patient Instructions (Addendum)
 Good to see you today!  START Lasix  20 mg daily  Please  bring all medications to your appointment  Your physician has requested that you have an echocardiogram. Echocardiography is a painless test that uses sound waves to create images of your heart. It provides your doctor with information about the size and shape of your heart and how well your heart's chambers and valves are working. This procedure takes approximately one hour. There are no restrictions for this procedure. Please do NOT wear cologne, perfume, aftershave, or lotions (deodorant is allowed). Please arrive 15 minutes prior to your appointment time.  Please note: We ask at that you not bring children with you during ultrasound (echo/ vascular) testing. Due to room size and safety concerns, children are not allowed in the ultrasound rooms during exams. Our front office staff cannot provide observation of children in our lobby area while testing is being conducted. An adult accompanying a patient to their appointment will only be allowed in the ultrasound room at the discretion of the ultrasound technician under special circumstances. We apologize for any inconvenience.  Labs done today, your results will be available in MyChart, we will contact you for abnormal readings  Your provider has recommended that  you wear a Zio Patch for 14 days.  This monitor will record your heart rhythm for our review.  IF you have any symptoms while wearing the monitor please press the button.  If you have any issues with the patch or you notice a red or orange light on it please call the company at [phone].  Once you remove the patch please mail it back to the company as soon as possible so we can get the results.  Please answer the phone for any calls from the company  Your physician recommends that you schedule a follow-up appointment :2-3 weeks with app clinic as scheduled  Follow up with DrSabharwal in 3 months(August) Call office in June  to schedule an appointment  If you have any questions or concerns before your next appointment please send us  a message through [place] or call our office at [phone].    TO LEAVE A MESSAGE FOR THE NURSE SELECT OPTION 2, PLEASE LEAVE A MESSAGE INCLUDING: YOUR NAME DATE OF BIRTH CALL BACK NUMBER REASON FOR CALL**this is important as we prioritize the call backs  YOU WILL RECEIVE A CALL BACK THE SAME DAY AS LONG AS YOU CALL BEFORE 4:00 PM At the [facility], you and your health needs are our priority. As part of our continuing mission to provide you with exceptional heart care, we have created designated Provider Care Teams. These Care Teams include your primary Cardiologist (physician) and Advanced Practice Providers (APPs- Physician Assistants and Nurse Practitioners) who all work together to provide you with the care you need, when you need it.   You may see any of the following providers on your designated Care Team at your next follow up: Dr Jules Oar Dr Peder Bourdon Dr. Alwin Baars Dr. Arta Lark Amy Marijane Shoulders, NP Ruddy Corral, [place] [facility], [place] Dennise Fitz, NP [place] Lee, NP Shawnee Dellen, NP Luster Salters, PharmD Bevely Brush, PharmD   Please be sure to bring in all your medications bottles to every appointment.    Thank you for choosing  [facility]

## 2023-07-22 NOTE — Telephone Encounter (Signed)
 Patient's kcl  medication has been sent to pt's pharmacy along with his lasix  and his med list changed and updated. Pt aware, agreeable, and verbalized understanding.   Staff message was sent to [facility] for Iron infusion precert.

## 2023-07-22 NOTE — Telephone Encounter (Signed)
-----   Message from [place] sent at 07/22/2023  2:57 PM EDT ----- BNP elevated, Increase Lasix  to 40 mg daily (not 20 as discussed at visit) Start 20 KCL daily.  Tsat and ferritin low, may be contributing to symptoms Please arrange iron infusion if he is agreeable.

## 2023-07-22 NOTE — Addendum Note (Signed)
 Encounter addended by: Elmarie Hacking, FNP on: 07/22/2023 4:23 PM  Actions taken: Clinical Note Signed

## 2023-07-22 NOTE — Progress Notes (Signed)
 ReDS Vest / Clip - 07/22/23 1200       ReDS Vest / Clip   Station Marker C    Ruler Value 30    ReDS Value Range High volume overload    ReDS Actual Value 41

## 2023-08-03 NOTE — Progress Notes (Signed)
 ADVANCED HEART FAILURE CLINIC NOTE  Primary Care: Barnetta Liberty, MD Primary Cardiologist: Dr. Lavonne Prairie EP: Dr. Lawana Pray HF Cardiologist: Bruce Caper  HPI: Arthur Klein is a 78 y.o. 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  In early 2023 he wore a Zio patch with 28% PVC burden; started on flecainide  with improvement in PVC burden to 4.9% on repeat monitor in October 2023.  Admitted in 4/24 with acute on chronic heart failure exacerbation; echo showed EF of 20 to 25%.  Right and left heart catheterization with nonobstructive CAD and CI of 2.47. He was diuresed and started on GDMT. Repeat zio patch from  07/16/22-07/23/22 w/ a 28% PVC burden. S/p unsuccessful ablation in 7/24, now on amio 200 bid.  Echo 3/25 EF 40-45%, normal RV, moderate MR, mild to mod AS with mean gradient 10 mmHg.  Interval hx:  Today he returns for HF follow up with his wife. He is SOB walking > 150 feet on flat ground. He is frustrated. Having frequent falls, most recent occurred when standing up to get off lawnmower and ended up on the ground, does not have re-collection of how is happened. Wife is worried. Denies palpitations, abnormal bleeding, CP, edema, or PND/Orthopnea. Appetite ok. Weight at home 189 pounds. He does not know what meds he is taking. Has not needed Lasix .   Activity level/exercise tolerance: worsening NYHA III-IIIb Orthopnea:  Sleeps on 2 pillows Paroxysmal noctural dyspnea:  No Chest pain/pressure:  No Orthostatic lightheadedness:  Yes Palpitations: No Lower extremity edema: No Presyncope/syncope:  Yes Cough:  No  Current Outpatient Medications  Medication Sig Dispense Refill   acetaminophen  (TYLENOL ) 500 MG tablet Take 500-1,000 mg by mouth every 6 (six) hours as needed for fever (for pain.).      B Complex Vitamins (VITAMIN B  COMPLEX) TABS Take 1 tablet by mouth daily.     carvedilol  (COREG ) 6.25 MG tablet Take 1 tablet (6.25 mg total) by mouth 2 (two) times daily. 60 tablet 3   chlorhexidine  (PERIDEX ) 0.12 % solution SMARTSIG:1 Capful(s) By Mouth Twice Daily     Cholecalciferol  (VITAMIN D -3) 1000 UNITS CAPS Take 1,000 Units by mouth daily.      colchicine  0.6 MG tablet Take 0.6 mg by mouth 2 (two) times daily as needed.     dapagliflozin  propanediol (FARXIGA ) 10 MG TABS tablet Take 1 tablet (10 mg total) by mouth daily. 30 tablet 11   diphenhydrAMINE  (BENADRYL ) 12.5 MG/5ML elixir Take 3.125 mg by mouth 4 (four) times daily as needed (for allergies.).     furosemide  (LASIX ) 40 MG tablet Take 1 tablet (40 mg total) by mouth daily. 30 tablet 5   Multiple Vitamins-Minerals (ICAPS AREDS 2 PO) Take 1 capsule by mouth daily.     Nutritional Supplements (JUICE PLUS FIBRE PO) Take 2 tablets by mouth daily. Vegetable Fruit     potassium chloride  SA (KLOR-CON  M20) 20 MEQ tablet Take 1 tablet (20 mEq total) by mouth daily. 30 tablet 5   PRESCRIPTION MEDICATION Take 2,000 mg by mouth once. Penicillin 500 mg each     Probiotic Product (PROBIOTIC DAILY PO) Take 1 tablet by mouth every morning.     sacubitril -valsartan  (ENTRESTO ) 97-103 MG Take 1 tablet by mouth 2 (two) times daily. 60 tablet 5   No current facility-administered medications for this visit.   Wt Readings from  Last 3 Encounters:  07/22/23 87.1 kg (192 lb)  07/02/23 86.4 kg (190 lb 8 oz)  05/22/23 88.1 kg (194 lb 3.2 oz)   There were no vitals taken for this visit.  PHYSICAL EXAM: General:  *** appearing.  No respiratory difficulty HEENT: normal Neck: supple. JVD *** cm.  Cor: PMI nondisplaced. Regular rate & rhythm. No rubs, gallops or murmurs. Lungs: clear Abdomen: soft, nontender, nondistended. Good bowel sounds. Extremities: no cyanosis, clubbing, rash, edema  Neuro: alert & oriented x 3. Moves all 4 extremities w/o difficulty. Affect pleasant.    ReDs reading: 41 %, abnormal ***  Orthostatics 07/22/23 Lying 130/56, HR 65 Sitting 128/80, HR 63 Standing 128/84, HR 73  DATA REVIEW  ECG: 08/06/22: NSR with frequent PVCs 07/22/23: NSR with PVC (personally reviewed)  ECHO: 06/24/22: LVEF 20-25%, normal RV function   3/25: EF 40-45%, normal RV, moderate MR, mild to mod AS with mean gradient 10 mmHg  CATH: 06/25/22: Diffuse ectasia of the RCA and LCx Moderate to severe concentric calcification in the proximal to mid LAD with mild to moderate concentric calcification in the proximal to mid LCx and RCA Most notable lesion is a 50% proximal RCA lesion and a bend between ectatic segments, otherwise moderate disease in OM1 and D2.  No flow-limiting lesions. Normal right heart cath pressures with LV EDP and PCWP of 6 to 9 mmHg.  Mean PAP 15 mmHg. Cardiac Output and Index: 5.01-2.47  CMR: 1. Severe LVE with global hypokinesis LVEF 28%  2. Diffuse mid myocardial gadolinium uptake at base with small area of scar in the most basal anterior septum under the aortic annulus  3.  Mild decrease in RV function RVEF 37%  4. 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 Normal T2 suggests no active inflammation or myocarditis  4.  Preserved cardiac output 4.2 L/min  5.  Mild appearing MR  6.  Moderate LAE  Cardiac PET 5/25 - no active myocardial inflammation/sarcoidosis, LVEF 36%  ASSESSMENT & PLAN:  Heart failure with reduced EF Etiology of YQ:MVHQIONGEXB cardiomyopathy; LHC/RHC above. 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  In 2013 noted to have pulmonary nodule on chest CT; CT chest 8/24 not suggestive of pulmonary sarcoidosis, did show stable benign pulmonary nodules. Cardiac PET 3/25 showed no active myocardial inflammation/sarcoidosis. NYHA class / AHA Stage:NYHA III-IIIb Volume status & Diuretics: ReDs 40%, appears mildly hypervolemic  on exam, change Lasix  to 20 mg daily. *** Vasodilators: Continue Entresto  97/103 mg bid.  Beta-Blocker: Continue Coreg  6.25 mg bid MRA: hyperkalemia; will hold for now with lower BP Cardiometabolic: Continue Farxiga  10 mg daily.  Devices therapies & Valvulopathies: Echo with LVEF 35%-40%; not a candidate for ICD.  Advanced therapies: Not currently a candidate. CPX with mild functional limitation.  - Labs today.  2. Mitral regurgitation  - Moderate by echo 3/25 - With continued dyspnea, repeat echo. - If MR worse, plan for RHC/TEE. Discussed with Dr Bruce Caper  3. CAD - LHC with nonobstructive CAD  - No chest pain, ? Is dyspnea is anginal equivalent - He has stopped his statin for unclear reasons, recommend he re-start  4. Frequent PVCs - Previously on amiodarone , followed by Dr. Carolynne Citron - Zio patch 5/24 with 28% PVC burden  - s/p unsuccessful PVC ablation on 7/24 - Zio patch 9/24; rare PVCs.  - Now off amiodarone  - HR irrregular today, 1 PVC on ECG -  Repeat 2 week Zio to re-quantify burden - Labs today  5. Borderline DM - A1c 5.9% - Unable to afford semaglutide - Continue SGLT2i  6. HTN - BP stable - Continue meds as above  7. Syncope - Orthostatics negative in clinic today, also with known valvule dz - Sounds like orthostasis, but cannot rule out arrhythmogenic cause - Place Live Zio 2 week to look for high-grade arrhythmias (also for PVC burden) - We discussed [place] DMV driving restrictions. - Echo as above - Labs today  Follow up in 2-3 weeks with APP for fluid check. Update echo soon. ***  Siddhi Dornbush Gracy Law AGACNP-BC  Advanced Heart Failure 08/03/23

## 2023-08-04 ENCOUNTER — Telehealth (HOSPITAL_COMMUNITY): Payer: Self-pay

## 2023-08-04 NOTE — Telephone Encounter (Signed)
 Called to confirm/remind patient of their appointment at the [facility] on 08/05/2023 2:00.   Appointment:   [x] Confirmed  [] Left mess   [] No answer/No voice mail  [] VM Full/unable to leave message  [] Phone not in service  Patient reminded to bring all medications and/or complete list.  Confirmed patient has transportation. Gave directions, instructed to utilize valet parking.

## 2023-08-05 ENCOUNTER — Encounter (HOSPITAL_COMMUNITY): Payer: Self-pay

## 2023-08-05 ENCOUNTER — Ambulatory Visit (HOSPITAL_COMMUNITY)
Admission: RE | Admit: 2023-08-05 | Discharge: 2023-08-05 | Disposition: A | Discharge status: Home / Self Care | Source: Ambulatory Visit | Attending: Internal Medicine | Admitting: Internal Medicine

## 2023-08-05 ENCOUNTER — Ambulatory Visit (HOSPITAL_COMMUNITY): Payer: Self-pay | Admitting: Internal Medicine

## 2023-08-05 VITALS — BP 106/64 | HR 67 | Ht 68.0 in | Wt 186.4 lb

## 2023-08-05 DIAGNOSIS — Z79899 Other long term (current) drug therapy: Secondary | ICD-10-CM | POA: Insufficient documentation

## 2023-08-05 DIAGNOSIS — I10 Essential (primary) hypertension: Secondary | ICD-10-CM

## 2023-08-05 DIAGNOSIS — I2510 Atherosclerotic heart disease of native coronary artery without angina pectoris: Secondary | ICD-10-CM

## 2023-08-05 DIAGNOSIS — I428 Other cardiomyopathies: Secondary | ICD-10-CM | POA: Insufficient documentation

## 2023-08-05 DIAGNOSIS — R0601 Orthopnea: Secondary | ICD-10-CM | POA: Insufficient documentation

## 2023-08-05 DIAGNOSIS — I110 Hypertensive heart disease with heart failure: Secondary | ICD-10-CM | POA: Diagnosis not present

## 2023-08-05 DIAGNOSIS — E875 Hyperkalemia: Secondary | ICD-10-CM | POA: Diagnosis not present

## 2023-08-05 DIAGNOSIS — E118 Type 2 diabetes mellitus with unspecified complications: Secondary | ICD-10-CM

## 2023-08-05 DIAGNOSIS — R55 Syncope and collapse: Secondary | ICD-10-CM

## 2023-08-05 DIAGNOSIS — Z91148 Patient's other noncompliance with medication regimen for other reason: Secondary | ICD-10-CM | POA: Insufficient documentation

## 2023-08-05 DIAGNOSIS — I5022 Chronic systolic (congestive) heart failure: Secondary | ICD-10-CM | POA: Diagnosis not present

## 2023-08-05 DIAGNOSIS — I340 Nonrheumatic mitral (valve) insufficiency: Secondary | ICD-10-CM | POA: Diagnosis not present

## 2023-08-05 DIAGNOSIS — E119 Type 2 diabetes mellitus without complications: Secondary | ICD-10-CM | POA: Insufficient documentation

## 2023-08-05 DIAGNOSIS — I493 Ventricular premature depolarization: Secondary | ICD-10-CM

## 2023-08-05 DIAGNOSIS — Z7984 Long term (current) use of oral hypoglycemic drugs: Secondary | ICD-10-CM | POA: Diagnosis not present

## 2023-08-05 LAB — BASIC METABOLIC PANEL WITH GFR
Anion gap: 10 (ref 5–15)
BUN: 15 mg/dL (ref 8–23)
CO2: 25 mmol/L (ref 22–32)
Calcium: 9.5 mg/dL (ref 8.9–10.3)
Chloride: 103 mmol/L (ref 98–111)
Creatinine, Ser: 1.12 mg/dL (ref 0.61–1.24)
GFR, Estimated: >60 mL/min (ref >60)
Glucose, Bld: 120 mg/dL — ABNORMAL HIGH (ref 70–99)
Potassium: 4.8 mmol/L (ref 3.5–5.1)
Sodium: 138 mmol/L (ref 135–145)

## 2023-08-05 LAB — BRAIN NATRIURETIC PEPTIDE: B Natriuretic Peptide: 309.3 pg/mL — ABNORMAL HIGH (ref 0.0–100.0)

## 2023-08-05 NOTE — Patient Instructions (Addendum)
 Good to see you today! No medication changes were made today   Labs done today, your results will be available in MyChart, we will contact you for abnormal readings.  Your physician recommends that you schedule a follow-up appointment :3 months (September) call office in July to schedule an appointment   If you have any questions or concerns before your next appointment please send us  a message through [place] or call our office at [phone].    TO LEAVE A MESSAGE FOR THE NURSE SELECT OPTION 2, PLEASE LEAVE A MESSAGE INCLUDING: YOUR NAME DATE OF BIRTH CALL BACK NUMBER REASON FOR CALL**this is important as we prioritize the call backs  YOU WILL RECEIVE A CALL BACK THE SAME DAY AS LONG AS YOU CALL BEFORE 4:00 PM At the [facility], you and your health needs are our priority. As part of our continuing mission to provide you with exceptional heart care, we have created designated Provider Care Teams. These Care Teams include your primary Cardiologist (physician) and Advanced Practice Providers (APPs- Physician Assistants and Nurse Practitioners) who all work together to provide you with the care you need, when you need it.   You may see any of the following providers on your designated Care Team at your next follow up: Dr Jules Oar Dr Peder Bourdon Dr. Alwin Baars Dr. Arta Lark Amy Marijane Shoulders, NP Ruddy Corral, [place] [facility] [place], [place] Dennise Fitz, NP [place] Lee, NP Shaw[place] Dellen, NP Luster Salters, PharmD Bevely Brush, PharmD   Please be sure to bring in all your medications bottles to every appointment.    Thank you for choosing [facility]

## 2023-08-05 NOTE — Progress Notes (Signed)
 ReDS Vest / Clip - 08/05/23 1300       ReDS Vest / Clip   Station Marker D    Ruler Value 33    ReDS Value Range Moderate volume overload    ReDS Actual Value 37

## 2023-08-06 ENCOUNTER — Other Ambulatory Visit (HOSPITAL_COMMUNITY): Payer: Self-pay | Admitting: Family Medicine

## 2023-08-06 ENCOUNTER — Telehealth: Payer: Self-pay

## 2023-08-06 DIAGNOSIS — D509 Iron deficiency anemia, unspecified: Secondary | ICD-10-CM

## 2023-08-06 NOTE — Telephone Encounter (Signed)
 Auth Submission: NO AUTH NEEDED Site of care: Site of care: [facility] Payer: [employer] medicare Medication & CPT/J Code(s) submitted: Feraheme (ferumoxytol) [id number] Route of submission (phone, fax, portal): portal Phone # Fax # Auth type: Buy/Bill [facility] Units/visits requested: 510mg  x 2 doses Reference number: [id number] Approval from: 08/06/23 to 11/06/23

## 2023-08-07 ENCOUNTER — Encounter (HOSPITAL_COMMUNITY)
Admission: RE | Admit: 2023-08-07 | Discharge: 2023-08-07 | Disposition: A | Discharge status: Home / Self Care | Source: Ambulatory Visit | Attending: Cardiology | Admitting: Cardiology

## 2023-08-07 DIAGNOSIS — D509 Iron deficiency anemia, unspecified: Secondary | ICD-10-CM | POA: Diagnosis present

## 2023-08-07 MED ORDER — SODIUM CHLORIDE 0.9 % IV SOLN
510.0000 mg | INTRAVENOUS | Status: DC
  Administered 2023-08-07: 510 mg via INTRAVENOUS
  Filled 2023-08-07: qty 510

## 2023-08-10 ENCOUNTER — Encounter (HOSPITAL_COMMUNITY)

## 2023-08-14 ENCOUNTER — Encounter (HOSPITAL_COMMUNITY)

## 2023-08-17 ENCOUNTER — Other Ambulatory Visit (HOSPITAL_COMMUNITY): Payer: Self-pay | Admitting: Cardiology

## 2023-08-19 ENCOUNTER — Ambulatory Visit (HOSPITAL_COMMUNITY)
Admission: RE | Admit: 2023-08-19 | Discharge: 2023-08-19 | Disposition: A | Discharge status: Home / Self Care | Source: Ambulatory Visit | Attending: Cardiology | Admitting: Cardiology

## 2023-08-19 DIAGNOSIS — D509 Iron deficiency anemia, unspecified: Secondary | ICD-10-CM | POA: Diagnosis present

## 2023-08-19 MED ORDER — SODIUM CHLORIDE 0.9 % IV SOLN
510.0000 mg | Freq: Once | INTRAVENOUS | Status: AC
  Administered 2023-08-19: 510 mg via INTRAVENOUS
  Filled 2023-08-19: qty 510

## 2023-08-21 ENCOUNTER — Other Ambulatory Visit (HOSPITAL_COMMUNITY): Payer: Self-pay | Admitting: Cardiology

## 2023-08-25 NOTE — Addendum Note (Signed)
 Encounter addended by: Debarah Garrison MATSU, RN on: 08/25/2023 12:20 PM  Actions taken: Imaging Exam ended

## 2023-08-31 DIAGNOSIS — E785 Hyperlipidemia, unspecified: Secondary | ICD-10-CM | POA: Insufficient documentation

## 2023-08-31 NOTE — Progress Notes (Unsigned)
 Cardiology Office Note:   Date:  09/01/2023  ID:  Bryen Hinderman, DOB 1945-06-18, MRN [id number] PCP: Larnell Hamilton, MD  [facility] Providers Cardiologist:  Lynwood Schilling, MD Electrophysiologist:  Will Gladis Norton, MD {  History of Present Illness:   Arthur Klein is a 78 y.o. male who presents for evaluation of bradycardia.  The patient recently was noted to have palpitations and an abnormal EKG.  He previously had PVCs noted in 2017.  At that time he had an abnormal stress test suggesting reduced ejection fraction.  There was no ischemia or infarction.  A month later follow-up echo demonstrated EF of 50 - 55%.      In Dec 2022 Echo suggested that the EF was slightly lower than previous at 40 - 45% so I changed him to Entresto .  He has had some episodes of dizziness and had a monitor with runs of NSVT.  This was in Feb 2023.   He had a perfusion study to rule out obstructive CAD.  He had no ischemia.  He did have an appt with Dr. Norton as he had a high burden of ventricular ectopy on a monitor and was started on flecainide .   Monitor demonstrated a significantly reduced PVC burden. When he saw Dr. Norton in Janise 2024 he held the Flecainide  for a week to see if his SOB improved.  He also failed amiodarone  because of some hand swelling and eventually had attempted ablation which was not reported to be successful.  I read this note from Dr. Waddell.  There is difficulty mapping and difficulty getting across the aortic valve.   Hisecho in April 2024 demonstrated that his EF was down to 20 - 25%.  He had a cardiac cath with some mild non obstructive disease.   Follow up echo in March demonstrated the EF to be 40 to 45%.  He had moderate mitral regurgitation.  He did have evidence of some mild volume overload at the last visit.  ReDs WAS 37%. His right heart pressures were not markedly abnormal.   He wore a monitor and had PVCs 28% burden.     He has seen Dr. Gardenia and had an MRI with EF  28% and RV dysfunction as well.  PET did not suggest sarcoid.  CPX had mild limitations.  I spoke with Dr. Waddell and Gardenia  prior to this appt. he did wear another monitor after being seen recently in the heart failure clinic.  I reviewed these results for him.  The predominant rhythm was sinus.  He did have recurrent frequent ventricular ectopy.  Longest run of nonsustained ventricular tachycardia was 7 beats.  Ventricular ectopy was back up to 24%.  There is also an echocardiogram.  He is scheduled to have a follow up echo next month.    Interestingly he feels much better after being given iron transfusions.  I do note that he had a mildly reduced saturation although he is not anemic.  Iron studies were otherwise unremarkable.  Since getting his iron infusion he is much less short of breath.  He is able to do more physical activity.  He is not describing any chest pressure, neck or arm discomfort.  He is not having any palpitations, presyncope or syncope.  He denies any PND or orthopnea.  ROS: As stated in the HPI and negative for all other systems.  Studies Reviewed:    EKG:    NA  Risk Assessment/Calculations:  Physical Exam:   VS:  BP 122/70   Pulse 76   Ht 5' 8.5 (1.74 m)   Wt 189 lb (85.7 kg)   SpO2 98%   BMI 28.32 kg/m    Wt Readings from Last 3 Encounters:  09/01/23 189 lb (85.7 kg)  08/07/23 182 lb 3.2 oz (82.6 kg)  08/05/23 186 lb 6.4 oz (84.6 kg)     GEN: Well nourished, well developed in no acute distress NECK: No JVD; No carotid bruits CARDIAC: RRR, 2 out of 6 apical systolic murmur radiating slightly at the aortic outflow tract, no diastolic murmurs, rubs, gallops RESPIRATORY:  Clear to auscultation without rales, wheezing or rhonchi  ABDOMEN: Soft, non-tender, non-distended EXTREMITIES:  No edema; No deformity   ASSESSMENT AND PLAN:   Heart failure with reduced EF: His ejection fraction was improved as above.  His blood pressure precludes further  med titration as he has been hypotensive before.  He seems to be more euvolemic and less symptomatic having done very well after iron infusion.  For now he will continue the meds as listed.  I will inquire with the [facility] the frequency or duration of iron infusions.  There was also mention of genetic testing and I will circle back to assess question as well.    Frequent PVC: I set him back up to see Dr. Waddell given the recurrent high burden of ventricular ectopy.    CAD: He had nonobstructive disease previously.  He is not having any anginal symptoms.  No change in therapy.  Borderline DM: A1c was 6.8.  He will continue the meds as listed.   Dyslipidemia: LDL was 79.  HDL 20.  No change in therapy.   MR: He does have follow-up echo ordered.  Consideration will be given to interventional management of this if this seems to be worsening.    Follow up with me in six months.   Signed, Lynwood Schilling, MD

## 2023-09-01 ENCOUNTER — Encounter: Payer: Self-pay | Admitting: Cardiology

## 2023-09-01 ENCOUNTER — Ambulatory Visit: Attending: Cardiology | Admitting: Cardiology

## 2023-09-01 VITALS — BP 122/70 | HR 76 | Ht 68.5 in | Wt 189.0 lb

## 2023-09-01 DIAGNOSIS — E785 Hyperlipidemia, unspecified: Secondary | ICD-10-CM | POA: Diagnosis not present

## 2023-09-01 DIAGNOSIS — I5023 Acute on chronic systolic (congestive) heart failure: Secondary | ICD-10-CM

## 2023-09-01 DIAGNOSIS — I493 Ventricular premature depolarization: Secondary | ICD-10-CM

## 2023-09-01 NOTE — Patient Instructions (Signed)
 Medication Instructions:  Your physician recommends that you continue on your current medications as directed. Please refer to the Current Medication list given to you today.  *If you need a refill on your cardiac medications before your next appointment, please call your pharmacy*  Lab Work: NONE If you have labs (blood work) drawn today and your tests are completely normal, you will receive your results only by: MyChart Message (if you have MyChart) OR A paper copy in the mail If you have any lab test that is abnormal or we need to change your treatment, we will call you to review the results.  Testing/Procedures: NONE  Follow-Up: At [facility], you and your health needs are our priority.  As part of our continuing mission to provide you with exceptional heart care, our providers are all part of one team.  This team includes your primary Cardiologist (physician) and Advanced Practice Providers or APPs (Physician Assistants and Nurse Practitioners) who all work together to provide you with the care you need, when you need it.  Your next appointment:   We will reach out to you to make an appointment with Dr. Waddell  Provider:   Waddell, MD  We recommend signing up for the patient portal called MyChart.  Sign up information is provided on this After Visit Summary.  MyChart is used to connect with patients for Virtual Visits (Telemedicine).  Patients are able to view lab/test results, encounter notes, upcoming appointments, etc.  Non-urgent messages can be sent to your provider as well.   To learn more about what you can do with MyChart, go to [email or web address].

## 2023-09-11 ENCOUNTER — Encounter: Payer: Self-pay | Admitting: Internal Medicine

## 2023-09-11 ENCOUNTER — Ambulatory Visit: Attending: Internal Medicine | Admitting: Internal Medicine

## 2023-09-11 VITALS — BP 126/66 | HR 70 | Ht 68.0 in | Wt 186.0 lb

## 2023-09-11 DIAGNOSIS — I493 Ventricular premature depolarization: Secondary | ICD-10-CM

## 2023-09-11 NOTE — Progress Notes (Signed)
 HPI [name] returns today for followup. He is a pleasant 78 yo man with a h/o chronic systolic heart failure and PVC's who underwent EP study. His PVC's were mapped to the LVOT region/coronary cusp but we never had an early site. Subsequent CMRI showed scar in the mid myocardial portion of the LVOT. A PET scan did not suggest sarcoid. The patient was placed on amiodarone  200 bid and he thinks he is better. No syncope or chest pain. His repeat monitor showed only 3% PVC's on amiodarone . However his amio was stopped due to hand swelling. He has more PVC's on repeat zio which are asymptomatic. His PVC's are asymptomatic. He feels well.  Allergies  Allergen Reactions   Erythrocin Diarrhea   Erythromycin Diarrhea   Lisinopril Cough   Nsaids Other (See Comments)    Gi bleeding Has tolerated limited, small amounts of Meloxicam   Oxycodone  Other (See Comments)    Became a wild person and had total amnesia   Ozempic (0.25 Or 0.5 Mg-Dose) [Semaglutide(0.25 Or 0.5mg -Dos)] Diarrhea     Current Outpatient Medications  Medication Sig Dispense Refill   acetaminophen  (TYLENOL ) 500 MG tablet Take 500-1,000 mg by mouth every 6 (six) hours as needed for fever (for pain.).      amLODipine  (NORVASC ) 5 MG tablet Take 5 mg by mouth daily.     B Complex Vitamins (VITAMIN B COMPLEX) TABS Take 1 tablet by mouth daily.     carvedilol  (COREG ) 6.25 MG tablet TAKE 1 TABLET(6.25 MG) BY MOUTH TWICE DAILY 60 tablet 3   chlorhexidine  (PERIDEX ) 0.12 % solution SMARTSIG:1 Capful(s) By Mouth Twice Daily     Cholecalciferol  (VITAMIN D -3) 1000 UNITS CAPS Take 1,000 Units by mouth daily.      colchicine  0.6 MG tablet Take 0.6 mg by mouth 2 (two) times daily as needed.     diphenhydrAMINE  (BENADRYL ) 12.5 MG/5ML elixir Take 3.125 mg by mouth 4 (four) times daily as needed (for allergies.).     FARXIGA  10 MG TABS tablet TAKE 1 TABLET(10 MG) BY MOUTH DAILY 30 tablet 11   furosemide  (LASIX ) 40 MG tablet Take 1 tablet  (40 mg total) by mouth daily. 30 tablet 5   metoprolol  succinate (TOPROL -XL) 25 MG 24 hr tablet Take 25 mg by mouth daily.     Multiple Vitamins-Minerals (ICAPS AREDS 2 PO) Take 1 capsule by mouth daily.     Nutritional Supplements (JUICE PLUS FIBRE PO) Take 2 tablets by mouth daily. Vegetable Fruit     potassium chloride  SA (KLOR-CON  M20) 20 MEQ tablet Take 1 tablet (20 mEq total) by mouth daily. 30 tablet 5   PRESCRIPTION MEDICATION Take 2,000 mg by mouth once. Penicillin 500 mg each     Probiotic Product (PROBIOTIC DAILY PO) Take 1 tablet by mouth every morning.     sacubitril -valsartan  (ENTRESTO ) 97-103 MG Take 1 tablet by mouth 2 (two) times daily. 60 tablet 5   No current facility-administered medications for this visit.     Past Medical History:  Diagnosis Date   Arthritis    Complication of anesthesia    doesn't wake up well,combative until wife talks with him   Diabetes mellitus without complication (HCC)    diet controlled    Esophageal reflux    Gout    hx of   H/O hiatal hernia    History of kidney stones    Hypertension    borderline HTNt   Plantar fasciitis    no problems  now   PONV (postoperative nausea and vomiting)    Prostate cancer (HCC) 08/27/2012   gleason 6, volume 59.3 cc   PVC (premature ventricular contraction)    hx of    Skin cancer 2011   melanoma on back, resected    Undescended left testes     ROS:   All systems reviewed and negative except as noted in the HPI.   Past Surgical History:  Procedure Laterality Date   CYSTOSCOPY WITH RETROGRADE PYELOGRAM, URETEROSCOPY AND STENT PLACEMENT Left 02/18/2016   Procedure: CYSTOSCOPY WITH LEFT RETROGRADE PYELOGRAM, URETEROSCOPY  AND URETERAL  STENT PLACEMENT;  Surgeon: Gretel Ferrara, MD;  Location: [facility];  Service: Urology;  Laterality: Left;   HERNIA REPAIR     INGUINAL HERNIA REPAIR Bilateral 03/16/2015   Procedure: LAPAROSCOPIC REPAIR OF BILATERAL FEMEROL AND  BILATERAL INGUINAL HERNIAS WITH  MESH;  Surgeon: Elspeth Schultze, MD;  Location: [facility];  Service: General;  Laterality: Bilateral;   INSERTION OF MESH Right 03/16/2015   Procedure: INSERTION OF MESH;  Surgeon: Elspeth Schultze, MD;  Location: [facility];  Service: General;  Laterality: Right;   KNEE ARTHROPLASTY  05/02/2011   Procedure: COMPUTER ASSISTED TOTAL KNEE ARTHROPLASTY;  Surgeon: Norleen LITTIE Gavel, MD;  Location: [facility];  Service: Orthopedics;  Laterality: Right;   KNEE ARTHROSCOPY     right   LAPAROSCOPIC LYSIS OF ADHESIONS  03/16/2015   Procedure: LAPAROSCOPIC LYSIS OF ADHESIONS;  Surgeon: Elspeth Schultze, MD;  Location: [facility];  Service: General;;   MELANOMA EXCISION     stage 3+ lower left lumbar region with axillary node removal   MOHS SURGERY     mose surgery     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   PROSTATE BIOPSY  08/27/12   PVC ABLATION N/A 09/22/2022   Procedure: PVC ABLATION;  Surgeon: Waddell Danelle ORN, MD;  Location: [facility] INVASIVE CV LAB;  Service: Cardiovascular;  Laterality: N/A;   RIGHT/LEFT HEART CATH AND CORONARY ANGIOGRAPHY N/A 06/25/2022   Procedure: RIGHT/LEFT HEART CATH AND CORONARY ANGIOGRAPHY;  Surgeon: Anner Alm ORN, MD;  Location: [facility] INVASIVE CV LAB;  Service: Cardiovascular;  Laterality: N/A;   ROBOT ASSISTED LAPAROSCOPIC RADICAL PROSTATECTOMY N/A 11/04/2012   Procedure: ROBOTIC ASSISTED LAPAROSCOPIC RADICAL PROSTATECTOMY LEVEL 1;  Surgeon: Noretta Ferrara, MD;  Location: [facility];  Service: Urology;  Laterality: N/A;   ROTATOR CUFF REPAIR     right   ROTATOR CUFF REPAIR     left   SHOULDER ARTHROSCOPY     left     Family History  Problem Relation Age of Onset   Congestive Heart Failure Mother    Diabetes Mother    Hypertension Mother    Diabetes Father    Hypertension Father    Stroke Father    Cancer Father        prostate, pancreas   Cancer Brother 35       prostate- xrt, seed implant   Anesthesia problems Neg Hx    Hypotension Neg Hx    Malignant hyperthermia Neg Hx    Pseudochol deficiency  Neg Hx      Social History   Socioeconomic History   Marital status: Married    Spouse name: Debbie   Number of children: 3   Years of education: Not on file   Highest education level: High school graduate  Occupational History   Occupation: Retired  Tobacco Use   Smoking status: Never   Smokeless tobacco: Current    Types: Sports administrator  Tobacco comments:    Patient declines materials to quit chewing tobacco.  Vaping Use   Vaping status: Never Used  Substance and Sexual Activity   Alcohol use: No   Drug use: No   Sexual activity: Yes    Partners: Female  Other Topics Concern   Not on file  Social History Narrative   Lives with wife.   Three children.     Social Drivers of Corporate investment banker Strain: Low Risk  (06/26/2022)   Overall Financial Resource Strain (CARDIA)    Difficulty of Paying Living Expenses: Not very hard  Food Insecurity: No Food Insecurity (06/24/2022)   Hunger Vital Sign    Worried About Running Out of Food in the Last Year: Never true    Ran Out of Food in the Last Year: Never true  Transportation Needs: No Transportation Needs (06/24/2022)   PRAPARE - Administrator, Civil Service (Medical): No    Lack of Transportation (Non-Medical): No  Physical Activity: Not on file  Stress: Not on file  Social Connections: Unknown (07/18/2021)   [facility]   Social Network    Social Network: Not on file  Intimate Partner Violence: Not At Risk (06/24/2022)   Humiliation, Afraid, Rape, and Kick questionnaire    Fear of Current or Ex-Partner: No    Emotionally Abused: No    Physically Abused: No    Sexually Abused: No     BP 126/66   Pulse 70   Ht 5' 8 (1.727 m)   Wt 186 lb (84.4 kg)   SpO2 96%   BMI 28.28 kg/m   Physical Exam:  Well appearing NAD HEENT: Unremarkable Neck:  No JVD, no thyromegally Lymphatics:  No adenopathy Back:  No CVA tenderness Lungs:  Clear with no wheezes HEART:  IRegular rate rhythm, no  murmurs, no rubs, no clicks Abd:  soft, positive bowel sounds, no organomegally, no rebound, no guarding Ext:  2 plus pulses, no edema, no cyanosis, no clubbing Skin:  No rashes no nodules Neuro:  CN II through XII intact, motor grossly intact  Assess/Plan:  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 While I am concerned about worsening of his LV function, I recommend watchful waiting.  There is no good way to make the PVC's go away.  Chronic systolic heart failure - he appears improved on medical therapy withlasix and a beta blocker and farxiga .We will follow. 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 Watchful waiting.    Danelle Waddell come

## 2023-09-11 NOTE — Patient Instructions (Signed)

## 2023-09-16 ENCOUNTER — Ambulatory Visit (HOSPITAL_COMMUNITY)
Admission: RE | Admit: 2023-09-16 | Discharge: 2023-09-16 | Disposition: A | Discharge status: Home / Self Care | Source: Ambulatory Visit | Attending: Family Medicine | Admitting: Family Medicine

## 2023-09-16 DIAGNOSIS — I517 Cardiomegaly: Secondary | ICD-10-CM | POA: Diagnosis not present

## 2023-09-16 DIAGNOSIS — I77810 Thoracic aortic ectasia: Secondary | ICD-10-CM | POA: Insufficient documentation

## 2023-09-16 DIAGNOSIS — I3481 Nonrheumatic mitral (valve) annulus calcification: Secondary | ICD-10-CM | POA: Diagnosis not present

## 2023-09-16 DIAGNOSIS — I340 Nonrheumatic mitral (valve) insufficiency: Secondary | ICD-10-CM | POA: Diagnosis not present

## 2023-09-16 DIAGNOSIS — I5022 Chronic systolic (congestive) heart failure: Secondary | ICD-10-CM | POA: Insufficient documentation

## 2023-09-16 LAB — ECHOCARDIOGRAM COMPLETE
AR max vel: 0.75 cm2
AV Area VTI: 0.79 cm2
AV Area mean vel: 0.71 cm2
AV Mean grad: 12 mmHg
AV Peak grad: 18.2 mmHg
Ao pk vel: 2.13 m/s
Area-P 1/2: 2.24 cm2
Calc EF: 54 %
MV VTI: 1.35 cm2
S' Lateral: 2.6 cm
Single Plane A2C EF: 48.4 %
Single Plane A4C EF: 60.7 %

## 2023-10-12 ENCOUNTER — Other Ambulatory Visit (HOSPITAL_COMMUNITY): Payer: Self-pay | Admitting: Cardiology

## 2023-10-12 ENCOUNTER — Telehealth: Payer: Self-pay | Admitting: Cardiology

## 2023-10-12 ENCOUNTER — Telehealth (HOSPITAL_COMMUNITY): Payer: Self-pay | Admitting: Cardiology

## 2023-10-12 NOTE — Telephone Encounter (Addendum)
 Verified medications with the patient and spouse. Would like to discuss plan of care and medication management. Appt made with Dr Lavona for 11/06/23

## 2023-10-12 NOTE — Telephone Encounter (Signed)
 Patient called with multiple mediation questions -refills -educated on refill process with pharmacy -co pay, patient to call if copay assistance is needed -current medication list    Patient reports he has not been taking amlodipine  or metoprolol , unsure of last dose -advised will confirm if he should restart despite meds listed on med list   -currently taking Entresto  Farxiga  Lasix  Potassium coreg 

## 2023-10-12 NOTE — Telephone Encounter (Signed)
 Pt called in asking what medications should he be on from Dr. Lavona. Please advise.

## 2023-10-13 ENCOUNTER — Other Ambulatory Visit (HOSPITAL_COMMUNITY): Payer: Self-pay

## 2023-10-13 ENCOUNTER — Telehealth: Payer: Self-pay | Admitting: Pharmacy Technician

## 2023-10-13 NOTE — Telephone Encounter (Signed)
 Pharmacy Patient Advocate Encounter   Received notification from Fax that prior authorization for Entresto  97-103 MG is required/requested.   Insurance verification completed.   The patient is insured through [employer] .   Per test claim: Refill too soon. PA is not needed at this time. Medication was filled 0/11/25. Next eligible fill date is 11/04/23.

## 2023-11-05 NOTE — Progress Notes (Unsigned)
 Cardiology Office Note:   Date:  11/06/2023  ID:  Arthur Klein, DOB 11-Jan-1946, MRN [id number] PCP: Larnell Hamilton, MD  [facility] Providers Cardiologist:  Lynwood Schilling, MD Electrophysiologist:  Will Gladis Norton, MD {  History of Present Illness:   Arthur Klein is a 78 y.o. male who presents for evaluation of bradycardia.  The patient recently was noted to have palpitations and an abnormal EKG.  He previously had PVCs noted in 2017.  At that time he had an abnormal stress test suggesting reduced ejection fraction.  There was no ischemia or infarction.  A month later follow-up echo demonstrated EF of 50 - 55%.      In Dec 2022 Echo suggested that the EF was slightly lower than previous at 40 - 45% so I changed him to Entresto .  He has had some episodes of dizziness and had a monitor with runs of NSVT.  This was in Feb 2023.   He had a perfusion study to rule out obstructive CAD.  He had no ischemia.  He did have an appt with Dr. Norton as he had a high burden of ventricular ectopy on a monitor and was started on flecainide .   Monitor demonstrated a significantly reduced PVC burden. When he saw Dr. Norton in Janise 2024 he held the Flecainide  for a week to see if his SOB improved.  He also failed amiodarone  because of some hand swelling and eventually had attempted ablation which was not reported to be successful.  I read this note from Dr. Waddell.  There is difficulty mapping and difficulty getting across the aortic valve.   Hisecho in April 2024 demonstrated that his EF was down to 20 - 25%.  He had a cardiac cath with some mild non obstructive disease.   Follow up echo in March demonstrated the EF to be 40 to 45%.  He had moderate mitral regurgitation.  He did have evidence of some mild volume overload at the last visit.  ReDs WAS 37%. His right heart pressures were not markedly abnormal.   He wore a monitor and had PVCs 28% burden.     He has seen Dr. Gardenia and had an MRI with EF  28% and RV dysfunction as well.  PET did not suggest sarcoid.  CPX had mild limitations.  I spoke with Dr. Waddell and Gardenia  prior to this appt. he did wear another monitor after being seen recently in the heart failure clinic.  I reviewed these results for him.  The predominant rhythm was sinus.  He did have recurrent frequent ventricular ectopy.  Longest run of nonsustained ventricular tachycardia was 7 beats.  Ventricular ectopy was back up to 24%.  There is also an echocardiogram.  This was done last month.  The EF was 45%.  Strain was abnormal.  The mitral valve regurgitation appeared to be mild.  After the last visit I sent him to see Dr. Waddell who did not suggest a change in meds or ablation for his increased PVC burden because he did not think this would be helpful.   He actually thinks he is doing better.  He felt better after iron infusions.  He felt really good after the first 1 and did not have as much of a boost after the second 1.  She does have follow-up in the [facility].  He is able to do a little more activity in the yard.  His wife thinks he is doing better.  He  had less lower extremity swelling.  He lost a little weight.  He denies any chest pressure, neck or arm discomfort.  He is really not feeling the palpitations.  He is not having any PND or orthopnea.   ROS: As stated in the HPI and negative for all other systems.  Studies Reviewed:    EKG:     NA  Risk Assessment/Calculations:         Physical Exam:   VS:  BP 100/60   Pulse 77   Ht 5' 8 (1.727 m)   Wt 187 lb 12.8 oz (85.2 kg)   SpO2 96%   BMI 28.55 kg/m    Wt Readings from Last 3 Encounters:  11/06/23 187 lb 12.8 oz (85.2 kg)  09/11/23 186 lb (84.4 kg)  09/01/23 189 lb (85.7 kg)     GEN: Well nourished, well developed in no acute distress NECK: No JVD; No carotid bruits CARDIAC: RRR, 2 out of 6 apical systolic murmur radiating slightly at the aortic outflow tract, no diastolic  murmurs, rubs, gallops RESPIRATORY:  Clear to auscultation without rales, wheezing or rhonchi  ABDOMEN: Soft, non-tender, non-distended EXTREMITIES:  No edema; No deformity   ASSESSMENT AND PLAN:   Heart failure with reduced EF:   EF was 45%.  At this point I think he is euvolemic and actually feeling well.  I sent a message to see if he is being scheduled for any further iron transfusions.  The blood pressure will not allow med titration as it has been hypotensive in the past.  No change in therapy.    Frequent PVC: He saw Dr. Waddell who did not suggest a change in meds or ablation for his increased PVC burden because he did not think this would be helpful.    CAD: He had nonobstructive disease previously.  He had negative perfusion study more recently.  Continue with risk reduction.    Borderline DM: A1c was 6.8 previously.  He will follow-up with his primary provider.    Dyslipidemia: LDL was 79.  Continue meds as listed.   MR: He had mild MR on echo in July 2025.    Follow up with me in 6 months  Signed, Lynwood Schilling, MD

## 2023-11-06 ENCOUNTER — Encounter: Payer: Self-pay | Admitting: Cardiology

## 2023-11-06 ENCOUNTER — Ambulatory Visit: Attending: Cardiology | Admitting: Cardiology

## 2023-11-06 VITALS — BP 100/60 | HR 77 | Ht 68.0 in | Wt 187.8 lb

## 2023-11-06 DIAGNOSIS — I5022 Chronic systolic (congestive) heart failure: Secondary | ICD-10-CM | POA: Diagnosis not present

## 2023-11-06 DIAGNOSIS — E785 Hyperlipidemia, unspecified: Secondary | ICD-10-CM

## 2023-11-06 DIAGNOSIS — I493 Ventricular premature depolarization: Secondary | ICD-10-CM

## 2023-11-06 DIAGNOSIS — I2510 Atherosclerotic heart disease of native coronary artery without angina pectoris: Secondary | ICD-10-CM | POA: Diagnosis not present

## 2023-11-06 DIAGNOSIS — I340 Nonrheumatic mitral (valve) insufficiency: Secondary | ICD-10-CM

## 2023-11-06 NOTE — Patient Instructions (Signed)

## 2023-12-01 NOTE — Progress Notes (Signed)
 ADVANCED HEART FAILURE CLINIC NOTE  Referring Physician: Larnell Hamilton, MD  Primary Care: Larnell Hamilton, MD Primary Cardiologist: Dr. Lavona EP: Dr. Inocencio HF: [place]  CC: Heart Failure  HPI: Arthur Klein is a 78 y.o. 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  His cardiac history dates back to at least 2022 with echocardiogram demonstrating EF of 40 to 45% at that time.  In early 2023 he wore a Zio patch with 28% PVC burden; started on flecainide  with improvement in PVC burden to 4.9% on repeat monitor in October 2023.  He was admitted in April 2024 with acute on chronic heart failure exacerbation; echocardiogram at that time with a EF of 20 to 25%.  Right and left heart catheterization with nonobstructive CAD and cardiac index of 2.47. He was diuresed and started on GDMT. He had a repeat ziopatch from  07/16/22-07/23/22 w/ a 28% PVC burden. Unsuccessful ablation in 7/24. Previously on amiodarone .   Interval hx:  Overall feeling fine. He has been active doing yard work. Every now and then he gets short of breath. Denies PND/Orthopnea. No chest pain. Appetite ok. No fever or chills.  Taking all medications.   Current Outpatient Medications  Medication Sig Dispense Refill   acetaminophen  (TYLENOL ) 500 MG tablet Take 500-1,000 mg by mouth every 6 (six) hours as needed for fever (for pain.).      amLODipine  (NORVASC ) 5 MG tablet TAKE 1 TABLET(5 MG) BY MOUTH DAILY 30 tablet 3   B Complex Vitamins (VITAMIN B COMPLEX) TABS Take 1 tablet by mouth daily.     carvedilol  (COREG ) 6.25 MG tablet TAKE 1 TABLET(6.25 MG) BY MOUTH TWICE DAILY 180 tablet 3   chlorhexidine  (PERIDEX ) 0.12 % solution SMARTSIG:1 Capful(s) By Mouth Twice Daily     Cholecalciferol  (VITAMIN D -3) 1000 UNITS CAPS Take 1,000 Units by mouth daily.      colchicine  0.6 MG tablet Take 0.6 mg by mouth 2 (two) times daily as needed.      diphenhydrAMINE  (BENADRYL ) 12.5 MG/5ML elixir Take 3.125 mg by mouth 4 (four) times daily as needed (for allergies.).     FARXIGA  10 MG TABS tablet TAKE 1 TABLET(10 MG) BY MOUTH DAILY 30 tablet 11   furosemide  (LASIX ) 40 MG tablet Take 1 tablet (40 mg total) by mouth daily. 30 tablet 5   metoprolol  succinate (TOPROL -XL) 25 MG 24 hr tablet Take 25 mg by mouth daily.     Multiple Vitamins-Minerals (ICAPS AREDS 2 PO) Take 1 capsule by mouth daily.     Nutritional Supplements (JUICE PLUS FIBRE PO) Take 2 tablets by mouth daily. Vegetable Fruit     potassium chloride  SA (KLOR-CON  M20) 20 MEQ tablet Take 1 tablet (20 mEq total) by mouth daily. 30 tablet 5   PRESCRIPTION MEDICATION Take 2,000 mg by mouth once. Penicillin 500 mg each     Probiotic Product (PROBIOTIC DAILY PO) Take 1 tablet by mouth every morning.     sacubitril -valsartan  (ENTRESTO ) 97-103 MG Take 1 tablet by mouth 2 (two) times daily. 60 tablet 5   No current facility-administered medications for this encounter.   PHYSICAL EXAM: Vitals:   12/02/23 1437  BP: 102/60  Pulse: 82  SpO2: 96%   Wt Readings from Last 3 Encounters:  12/02/23 84.3 kg (185 lb 12.8 oz)  11/06/23 85.2 kg (187 lb 12.8 oz)  09/11/23 84.4 kg (186 lb)    General:   No resp difficulty Neck:  no JVD.  Cor: Regular rate & rhythm. Lungs: clear Abdomen: soft, nontender, nondistended.  Extremities: no  edema Neuro: alert & oriented x3   DATA REVIEW  ECG: 08/06/22: NSR with frequent PVCs  ECHO: July 2025- LVEF 45% normal RV. Mild MR.  06/24/22: LVEF 20-25%, normal RV function as per my interpretation  CATH: 06/25/22: Diffuse ectasia of the RCA and LCx Moderate to severe concentric calcification in the proximal to mid LAD with mild to moderate concentric calcification in the proximal to mid LCx and RCA Most notable lesion is a 50% proximal RCA lesion and a bend between ectatic segments, otherwise moderate disease in OM1 and D2.  No flow-limiting  lesions. Normal right heart cath pressures with LV EDP and PCWP of 6 to 9 mmHg.  Mean PAP 15 mmHg. Cardiac Output and Index: 5.01-2.47  CMR: 1. Severe LVE with global hypokinesis LVEF 28%  2. Diffuse mid myocardial gadolinium uptake at base with small area of scar in the most basal anterior septum under the aortic annulus  3.  Mild decrease in RV function RVEF 37%  4. 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 Normal T2 suggests no active inflammation or myocarditis  4.  Preserved cardiac output 4.2 L/min  5.  Mild appearing MR  6.  Moderate LAE   ASSESSMENT & PLAN:  Heart failure with improved EF Etiology of YQ:Wnwpdryzfpr cardiomyopathy; LHC/RHC above. 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 Will obtain PET-CT to evaluate for cardiac sarcoid. In 2013 noted to have pulmonary nodule on chest CT. Cardiac PET - negative for sarcoid.  NYHA class / AHA Stage: NYHA II.  Volume status & Diuretics:   Appears euvolemic. Stop potassium  Vasodilators: Continue entresto  to 97/103mg  BID. Continue  amlodipine  5mg  BID.  Beta-Blocker: Continue  coreg  6.25mg  BID FMJ:ybezmxjozfpj; will hold for now.  Cardiometabolic: continue farxiga  10 mg daily.  Devices therapies & Valvulopathies: repeat TTE with improved LVEF 45%  not a candidate for ICD.  Advanced therapies:Not currently a candidate. EF improved. CPX with mild functional limitation.  We dicussed most recent Echo. Repeat BMET today.   2.Frequent PVC - continue amiodarone  200mg  Bid; followed by Dr. Waddell - 5/28 ziopatch with 28% PVC burden - Seen by Dr. Waddell on 09/11/23, recommended watching PVCs at this time.  - S/P PVC ablation on 09/22/22 unsuccessful.  - Reviewed ziopatch from 9/24; rare PVCs.   4. CAD - Nonobstructive CAD as noted on LHC above - crestor  10mg  every other day; reports myalgias with lipitor.  -No chest pain.   5.  Borderline DM -Most recent A1c 5.9% -Unable to afford semaglutide -continue farxiga   6. Hypertension  - Stable. Continue curret regimen.   7. Mitral regurgitation  - Mild on Echo 09/2023   Follow up as needed. Continue to follow up with Dr Lavona. I spent 30 minutes reviewing records, interviewing/examining patient, and managing orders.     Laritza Vokes NP-C  2:42 PM

## 2023-12-02 ENCOUNTER — Encounter (HOSPITAL_COMMUNITY): Payer: Self-pay | Admitting: Cardiology

## 2023-12-02 ENCOUNTER — Ambulatory Visit (HOSPITAL_COMMUNITY): Payer: Self-pay | Admitting: Adult Health

## 2023-12-02 ENCOUNTER — Ambulatory Visit (HOSPITAL_COMMUNITY)
Admission: RE | Admit: 2023-12-02 | Discharge: 2023-12-02 | Disposition: A | Discharge status: Home / Self Care | Source: Ambulatory Visit | Attending: Adult Health | Admitting: Adult Health

## 2023-12-02 VITALS — BP 102/60 | HR 82 | Ht 68.0 in | Wt 185.8 lb

## 2023-12-02 DIAGNOSIS — I428 Other cardiomyopathies: Secondary | ICD-10-CM | POA: Insufficient documentation

## 2023-12-02 DIAGNOSIS — E875 Hyperkalemia: Secondary | ICD-10-CM | POA: Insufficient documentation

## 2023-12-02 DIAGNOSIS — Z7984 Long term (current) use of oral hypoglycemic drugs: Secondary | ICD-10-CM | POA: Diagnosis not present

## 2023-12-02 DIAGNOSIS — I110 Hypertensive heart disease with heart failure: Secondary | ICD-10-CM | POA: Insufficient documentation

## 2023-12-02 DIAGNOSIS — I493 Ventricular premature depolarization: Secondary | ICD-10-CM | POA: Insufficient documentation

## 2023-12-02 DIAGNOSIS — E119 Type 2 diabetes mellitus without complications: Secondary | ICD-10-CM | POA: Diagnosis not present

## 2023-12-02 DIAGNOSIS — I5022 Chronic systolic (congestive) heart failure: Secondary | ICD-10-CM | POA: Insufficient documentation

## 2023-12-02 DIAGNOSIS — I2510 Atherosclerotic heart disease of native coronary artery without angina pectoris: Secondary | ICD-10-CM | POA: Diagnosis not present

## 2023-12-02 DIAGNOSIS — I340 Nonrheumatic mitral (valve) insufficiency: Secondary | ICD-10-CM | POA: Insufficient documentation

## 2023-12-02 DIAGNOSIS — Z79899 Other long term (current) drug therapy: Secondary | ICD-10-CM | POA: Diagnosis not present

## 2023-12-02 LAB — BASIC METABOLIC PANEL WITH GFR
Anion gap: 9 (ref 5–15)
BUN: 23 mg/dL (ref 8–23)
CO2: 20 mmol/L — ABNORMAL LOW (ref 22–32)
Calcium: 9.2 mg/dL (ref 8.9–10.3)
Chloride: 105 mmol/L (ref 98–111)
Creatinine, Ser: 1.65 mg/dL — ABNORMAL HIGH (ref 0.61–1.24)
GFR, Estimated: 43 mL/min — ABNORMAL LOW (ref >60)
Glucose, Bld: 143 mg/dL — ABNORMAL HIGH (ref 70–99)
Potassium: 5.3 mmol/L — ABNORMAL HIGH (ref 3.5–5.1)
Sodium: 134 mmol/L — ABNORMAL LOW (ref 135–145)

## 2023-12-02 NOTE — Patient Instructions (Signed)
 Medication Changes:  STOP POTASSIUM   Lab Work:  Labs done today, your results will be available in MyChart, we will contact you for abnormal readings.  Follow-Up in: AS NEEDED--- PLEASE CONTINUE FOLLOWING WITH DR. HOCHREIN   At the [facility], you and your health needs are our priority. We have a designated team specialized in the treatment of Heart Failure. This Care Team includes your primary Heart Failure Specialized Cardiologist (physician), Advanced Practice Providers (APPs- Physician Assistants and Nurse Practitioners), and Pharmacist who all work together to provide you with the care you need, when you need it.   You may see any of the following providers on your designated Care Team at your next follow up:  Dr. Toribio Fuel Dr. Ezra Shuck Dr. Ria Commander Dr. Odis Brownie Greig Mosses, NP Caffie Shed, [place] [facility] [place], [place] Beckey Coe, NP [place] Lee, NP Tinnie Redman, PharmD   Please be sure to bring in all your medications bottles to every appointment.   Need to Contact Us :  If you have any questions or concerns before your next appointment please send us  a message through [place] or call our office at [phone].    TO LEAVE A MESSAGE FOR THE NURSE SELECT OPTION 2, PLEASE LEAVE A MESSAGE INCLUDING: YOUR NAME DATE OF BIRTH CALL BACK NUMBER REASON FOR CALL**this is important as we prioritize the call backs  YOU WILL RECEIVE A CALL BACK THE SAME DAY AS LONG AS YOU CALL BEFORE 4:00 PM

## 2023-12-05 ENCOUNTER — Telehealth: Payer: Self-pay | Admitting: Physician Assistant

## 2023-12-05 NOTE — Telephone Encounter (Signed)
 Arthur Klein was seen in the AHF clinic on 10/01, and had labs drawn.  Someone left a message on his wife's phone that he needed to call ASAP because of critical lab values.  His wife does not check her phone every day and he is not sure when the message was left.  Of note, at that office visit, he was told to stop his potassium.  The labs are below.  His creatinine was well above normal at 1.65 and his potassium was very elevated at 5.3 with a sodium of 134.  He feels fine, has not had palpitations.  I reviewed his medications.  I requested that temporarily, he hold the furosemide , Farxiga , and Entresto .  I explained that he would likely restart those medicines as early as next week, but some of the doses may change.  I also said that it was important that we get his potassium down and improve his kidney function.  He understands and will wait for call from the office.      Latest Ref Rng & Units 12/02/2023    2:56 PM 08/05/2023    2:43 PM 07/22/2023   11:33 AM  BMP  Glucose 70 - 99 mg/dL 856  879  879   BUN 8 - 23 mg/dL 23  15  8    Creatinine 0.61 - 1.24 mg/dL 8.34  8.87  9.09   Sodium 135 - 145 mmol/L 134  138  139   Potassium 3.5 - 5.1 mmol/L 5.3  4.8  4.0   Chloride 98 - 111 mmol/L 105  103  105   CO2 22 - 32 mmol/L 20  25  25    Calcium  8.9 - 10.3 mg/dL 9.2  9.5  9.2    Maysen Sudol, PA-C 12/05/2023 5:40 PM

## 2023-12-07 NOTE — Telephone Encounter (Signed)
 Patient aware of lab results  Reports he was told by on call provider to stop all meds except coreg  and amlodipine  10/4.   Advised will forward to provider for further instructions. In the meantime continue to hold meds as instructed return for labs 10/7

## 2023-12-08 ENCOUNTER — Ambulatory Visit (HOSPITAL_COMMUNITY)
Admission: RE | Admit: 2023-12-08 | Discharge: 2023-12-08 | Disposition: A | Discharge status: Home / Self Care | Source: Ambulatory Visit | Attending: Cardiology | Admitting: Cardiology

## 2023-12-08 DIAGNOSIS — I5022 Chronic systolic (congestive) heart failure: Secondary | ICD-10-CM | POA: Diagnosis present

## 2023-12-08 LAB — BASIC METABOLIC PANEL WITH GFR
Anion gap: 11 (ref 5–15)
BUN: 17 mg/dL (ref 8–23)
CO2: 23 mmol/L (ref 22–32)
Calcium: 9.4 mg/dL (ref 8.9–10.3)
Chloride: 105 mmol/L (ref 98–111)
Creatinine, Ser: 1.15 mg/dL (ref 0.61–1.24)
GFR, Estimated: >60 mL/min (ref >60)
Glucose, Bld: 192 mg/dL — ABNORMAL HIGH (ref 70–99)
Potassium: 4.6 mmol/L (ref 3.5–5.1)
Sodium: 139 mmol/L (ref 135–145)

## 2023-12-09 ENCOUNTER — Ambulatory Visit (HOSPITAL_COMMUNITY): Payer: Self-pay | Admitting: Adult Health

## 2023-12-09 DIAGNOSIS — I5020 Unspecified systolic (congestive) heart failure: Secondary | ICD-10-CM

## 2023-12-11 MED ORDER — SACUBITRIL-VALSARTAN 97-103 MG PO TABS: ORAL_TABLET | ORAL | Status: DC

## 2023-12-11 NOTE — Telephone Encounter (Signed)
-----   Message from Nurse Powell S sent at 12/11/2023 11:40 AM EDT ----- Regarding: FW: HF follow up Hey can you look at his chart please. Labs 10/4 abn Lasix  40//KCL//Ent 97/103//Farxiga  all stopped, repeat labs on 10/7 improved but Amy just said stable, so not sure what to start him back on. He said wt has been increasing about 3-4 lbs since meds stopped not sure if SOB is any worse than normal, no edema, thanks ----- Message ----- From: Lenetta Greig BIRCH, NP Sent: 12/09/2023   4:45 PM EDT To: Shona KANDICE Shad, PA-C; Powell CHRISTELLA Latino, RN Subject: HF follow up                                   We will get him in for an appointment .   Thanks ----- Message ----- From: Shad Shona KANDICE DEVONNA Sent: 12/09/2023   4:31 PM EDT To: Greig BIRCH Lenetta, NP; Shona KANDICE Shad, PA-C  I see that his BMET improved, not sure if you knew that I had told him to hold the Lasix , Entresto  and Farxiga . See phone note.  Just wanted to make sure you knew, glad he's doing well.  Shona

## 2023-12-11 NOTE — Telephone Encounter (Signed)
 Pt aware, agreeable, and verbalized understanding. He will get repeat labs on 10/21 at [facility] (order placed), f/u appt sch 11/4, med list updated, if he starts retaining fluid (wt gain/sob/edema) he will let us  know

## 2023-12-11 NOTE — Telephone Encounter (Signed)
 Kidney function much improved. Let's have him stay off lasix . Restart entresto  at 49/51 mg BID and restart Farxiga .   BMET 1 week.   F/u 3 weeks w/ APP

## 2024-01-04 NOTE — Progress Notes (Signed)
 ADVANCED HEART FAILURE CLINIC NOTE  Referring Physician: Larnell Hamilton, MD  Primary Care: Larnell Hamilton, MD Primary Cardiologist: Dr. Lavona EP: Dr. Inocencio HF: [place]  CC: Heart Failure  HPI: Arthur Klein is a 78 y.o. 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  His cardiac history dates back to at least 2022 with echocardiogram demonstrating EF of 40 to 45% at that time.  In early 2023 he wore a Zio patch with 28% PVC burden; started on flecainide  with improvement in PVC burden to 4.9% on repeat monitor in October 2023.  He was admitted in April 2024 with acute on chronic heart failure exacerbation; echocardiogram at that time with a EF of 20 to 25%.  Right and left heart catheterization with nonobstructive CAD and cardiac index of 2.47. He was diuresed and started on GDMT. He had a repeat ziopatch from  07/16/22-07/23/22 w/ a 28% PVC burden. Unsuccessful ablation in 7/24. Previously on amiodarone .   Interval hx:  Overall feeling fine. He has been active doing yard work. Every now and then he gets short of breath. Denies PND/Orthopnea. No chest pain. Appetite ok. No fever or chills.  Taking all medications.   Current Outpatient Medications  Medication Sig Dispense Refill   acetaminophen  (TYLENOL ) 500 MG tablet Take 500-1,000 mg by mouth every 6 (six) hours as needed for fever (for pain.).      amLODipine  (NORVASC ) 5 MG tablet TAKE 1 TABLET(5 MG) BY MOUTH DAILY 30 tablet 3   B Complex Vitamins (VITAMIN B COMPLEX) TABS Take 1 tablet by mouth daily.     carvedilol  (COREG ) 6.25 MG tablet TAKE 1 TABLET(6.25 MG) BY MOUTH TWICE DAILY 180 tablet 3   chlorhexidine  (PERIDEX ) 0.12 % solution SMARTSIG:1 Capful(s) By Mouth Twice Daily     Cholecalciferol  (VITAMIN D -3) 1000 UNITS CAPS Take 1,000 Units by mouth daily.      colchicine  0.6 MG tablet Take 0.6 mg by mouth 2 (two) times daily as needed.      diphenhydrAMINE  (BENADRYL ) 12.5 MG/5ML elixir Take 3.125 mg by mouth 4 (four) times daily as needed (for allergies.).     FARXIGA  10 MG TABS tablet TAKE 1 TABLET(10 MG) BY MOUTH DAILY 30 tablet 11   metoprolol  succinate (TOPROL -XL) 25 MG 24 hr tablet Take 25 mg by mouth daily.     Multiple Vitamins-Minerals (ICAPS AREDS 2 PO) Take 1 capsule by mouth daily.     Nutritional Supplements (JUICE PLUS FIBRE PO) Take 2 tablets by mouth daily. Vegetable Fruit     PRESCRIPTION MEDICATION Take 2,000 mg by mouth once. Penicillin 500 mg each     Probiotic Product (PROBIOTIC DAILY PO) Take 1 tablet by mouth every morning.     sacubitril -valsartan  (ENTRESTO ) 97-103 MG Take 1/2 tab Twice daily     No current facility-administered medications for this visit.   PHYSICAL EXAM: There were no vitals filed for this visit.  Wt Readings from Last 3 Encounters:  12/02/23 84.3 kg (185 lb 12.8 oz)  11/06/23 85.2 kg (187 lb 12.8 oz)  09/11/23 84.4 kg (186 lb)    General:   No resp difficulty Neck: no JVD.  Cor: Regular rate & rhythm. Lungs: clear Abdomen: soft, nontender, nondistended.  Extremities: no  edema Neuro: alert & oriented x3   DATA REVIEW  ECG: 08/06/22: NSR with frequent PVCs  ECHO: July 2025- LVEF 45% normal RV. Mild MR.  06/24/22: LVEF 20-25%, normal RV function as  per my interpretation  CATH: 06/25/22: Diffuse ectasia of the RCA and LCx Moderate to severe concentric calcification in the proximal to mid LAD with mild to moderate concentric calcification in the proximal to mid LCx and RCA Most notable lesion is a 50% proximal RCA lesion and a bend between ectatic segments, otherwise moderate disease in OM1 and D2.  No flow-limiting lesions. Normal right heart cath pressures with LV EDP and PCWP of 6 to 9 mmHg.  Mean PAP 15 mmHg. Cardiac Output and Index: 5.01-2.47  CMR: 1. Severe LVE with global hypokinesis LVEF 28%  2. Diffuse mid myocardial gadolinium uptake at base with small  area of scar in the most basal anterior septum under the aortic annulus  3.  Mild decrease in RV function RVEF 37%  4. 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 Normal T2 suggests no active inflammation or myocarditis  4.  Preserved cardiac output 4.2 L/min  5.  Mild appearing MR  6.  Moderate LAE   ASSESSMENT & PLAN:  Heart failure with improved EF Etiology of YQ:Wnwpdryzfpr cardiomyopathy; LHC/RHC above. 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 Will obtain PET-CT to evaluate for cardiac sarcoid. In 2013 noted to have pulmonary nodule on chest CT. Cardiac PET - negative for sarcoid.  NYHA class / AHA Stage: NYHA II.  Volume status & Diuretics:   Appears euvolemic. Stop potassium  Vasodilators: Continue entresto  to 97/103mg  BID. Continue  amlodipine  5mg  BID.  Beta-Blocker: Continue  coreg  6.25mg  BID FMJ:ybezmxjozfpj; will hold for now.  Cardiometabolic: continue farxiga  10 mg daily.  Devices therapies & Valvulopathies: repeat TTE with improved LVEF 45%  not a candidate for ICD.  Advanced therapies:Not currently a candidate. EF improved. CPX with mild functional limitation.  We dicussed most recent Echo. Repeat BMET today.   2.Frequent PVC - continue amiodarone  200mg  Bid; followed by Dr. Waddell - 5/28 ziopatch with 28% PVC burden - Seen by Dr. Waddell on 09/11/23, recommended watching PVCs at this time.  - S/P PVC ablation on 09/22/22 unsuccessful.  - Reviewed ziopatch from 9/24; rare PVCs.   4. CAD - Nonobstructive CAD as noted on LHC above - crestor  10mg  every other day; reports myalgias with lipitor.  -No chest pain.   5. Borderline DM -Most recent A1c 5.9% -Unable to afford semaglutide -continue farxiga   6. Hypertension  - Stable. Continue curret regimen.   7. Mitral regurgitation  - Mild on Echo 09/2023   Follow up as needed. Continue to follow up with Dr Lavona. I  spent 30 minutes reviewing records, interviewing/examining patient, and managing orders.     Arthur Klein [name] NP-C  11:40 AM

## 2024-01-05 ENCOUNTER — Telehealth (HOSPITAL_COMMUNITY): Payer: Self-pay

## 2024-01-05 ENCOUNTER — Ambulatory Visit (HOSPITAL_COMMUNITY)
Admission: RE | Admit: 2024-01-05 | Discharge: 2024-01-05 | Disposition: A | Discharge status: Home / Self Care | Source: Ambulatory Visit | Attending: Family Medicine | Admitting: Family Medicine

## 2024-01-05 ENCOUNTER — Ambulatory Visit (HOSPITAL_COMMUNITY): Payer: Self-pay | Admitting: Family Medicine

## 2024-01-05 ENCOUNTER — Encounter (HOSPITAL_COMMUNITY): Payer: Self-pay

## 2024-01-05 ENCOUNTER — Telehealth: Payer: Self-pay

## 2024-01-05 ENCOUNTER — Other Ambulatory Visit (HOSPITAL_COMMUNITY): Payer: Self-pay

## 2024-01-05 VITALS — BP 140/90 | HR 89 | Ht 68.0 in | Wt 192.0 lb

## 2024-01-05 DIAGNOSIS — I5020 Unspecified systolic (congestive) heart failure: Secondary | ICD-10-CM

## 2024-01-05 DIAGNOSIS — I340 Nonrheumatic mitral (valve) insufficiency: Secondary | ICD-10-CM | POA: Insufficient documentation

## 2024-01-05 DIAGNOSIS — I2510 Atherosclerotic heart disease of native coronary artery without angina pectoris: Secondary | ICD-10-CM | POA: Insufficient documentation

## 2024-01-05 DIAGNOSIS — Z7984 Long term (current) use of oral hypoglycemic drugs: Secondary | ICD-10-CM | POA: Diagnosis not present

## 2024-01-05 DIAGNOSIS — I110 Hypertensive heart disease with heart failure: Secondary | ICD-10-CM | POA: Insufficient documentation

## 2024-01-05 DIAGNOSIS — I493 Ventricular premature depolarization: Secondary | ICD-10-CM | POA: Insufficient documentation

## 2024-01-05 DIAGNOSIS — I10 Essential (primary) hypertension: Secondary | ICD-10-CM

## 2024-01-05 DIAGNOSIS — I5022 Chronic systolic (congestive) heart failure: Secondary | ICD-10-CM | POA: Diagnosis not present

## 2024-01-05 DIAGNOSIS — Z79899 Other long term (current) drug therapy: Secondary | ICD-10-CM | POA: Diagnosis not present

## 2024-01-05 DIAGNOSIS — R7303 Prediabetes: Secondary | ICD-10-CM | POA: Diagnosis not present

## 2024-01-05 LAB — BASIC METABOLIC PANEL WITH GFR
Anion gap: 11 (ref 5–15)
BUN: 7 mg/dL — ABNORMAL LOW (ref 8–23)
CO2: 25 mmol/L (ref 22–32)
Calcium: 9.1 mg/dL (ref 8.9–10.3)
Chloride: 105 mmol/L (ref 98–111)
Creatinine, Ser: 0.89 mg/dL (ref 0.61–1.24)
GFR, Estimated: >60 mL/min (ref >60)
Glucose, Bld: 141 mg/dL — ABNORMAL HIGH (ref 70–99)
Potassium: 3.4 mmol/L — ABNORMAL LOW (ref 3.5–5.1)
Sodium: 141 mmol/L (ref 135–145)

## 2024-01-05 LAB — BRAIN NATRIURETIC PEPTIDE: B Natriuretic Peptide: 132.7 pg/mL — ABNORMAL HIGH (ref 0.0–100.0)

## 2024-01-05 MED ORDER — SPIRONOLACTONE 25 MG PO TABS: 12.5000 mg | ORAL_TABLET | Freq: Every day | ORAL | 3 refills | Status: DC

## 2024-01-05 MED ORDER — SACUBITRIL-VALSARTAN 49-51 MG PO TABS: 1.0000 | ORAL_TABLET | Freq: Two times a day (BID) | ORAL | 1 refills | Status: AC

## 2024-01-05 MED ORDER — METOPROLOL SUCCINATE ER 50 MG PO TB24: 50.0000 mg | ORAL_TABLET | Freq: Every day | ORAL | 1 refills | Status: AC

## 2024-01-05 MED ORDER — DAPAGLIFLOZIN PROPANEDIOL 10 MG PO TABS: 10.0000 mg | ORAL_TABLET | Freq: Every day | ORAL | 1 refills | Status: AC

## 2024-01-05 MED ORDER — FUROSEMIDE 20 MG PO TABS: 40.0000 mg | ORAL_TABLET | Freq: Every day | ORAL | 0 refills | Status: AC | PRN

## 2024-01-05 NOTE — Telephone Encounter (Signed)
 Advanced Heart Failure Patient Advocate Encounter  Test billing for this patient's current coverage (Rx Advance) returns a $0 copay for 90 day supply of Farxiga  and Sacubitril -Valsartan .  This test claim was processed through [facility]- copay amounts may vary at other pharmacies due to pharmacy/plan contracts, or as the patient moves through the different stages of their insurance plan.  Rachel DEL, CPhT Rx Patient Advocate Phone: [phone]

## 2024-01-05 NOTE — Telephone Encounter (Signed)
 Called to confirm/remind patient of their appointment at the [facility] on 01/05/24.   Appointment:   [] Confirmed  [x] Left mess   [] No answer/No voice mail  [] VM Full/unable to leave message  [] Phone not in service  And to bring in all medications and/or complete list.

## 2024-01-05 NOTE — Patient Instructions (Signed)
 Medication Changes:  STOP CARVEDILOL    START METOPROLOL  SUCCINATE 50MG  ONCE DAILY   CHANGE ENTRESTO  TO 49/51MG  TWICE DAILY   TAKE FUROSEMIDE  40MG  ONCE DAILY ONLY AS NEEDED FOR SWELLING OR WEIGHT GAIN OF 3 POUNDS OVERNIGHT OR 5 POUNDS IN 1 WEEK   Lab Work:  Labs done today, your results will be available in MyChart, we will contact you for abnormal readings  Follow-Up in: AS NEEDED WITH US , and THEN follow up with Dr. Lavona   At the [facility], you and your health needs are our priority. We have a designated team specialized in the treatment of Heart Failure. This Care Team includes your primary Heart Failure Specialized Cardiologist (physician), Advanced Practice Providers (APPs- Physician Assistants and Nurse Practitioners), and Pharmacist who all work together to provide you with the care you need, when you need it.   You may see any of the following providers on your designated Care Team at your next follow up:  Dr. Toribio Fuel Dr. Ezra Shuck Dr. Odis Brownie Greig Mosses, NP Caffie Shed, [place] [facility] [place], [place] Beckey Coe, NP [place] Lee, NP Tinnie Redman, PharmD   Please be sure to bring in all your medications bottles to every appointment.   Need to Contact Us :  If you have any questions or concerns before your next appointment please send us  a message through [place] or call our office at [phone].    TO LEAVE A MESSAGE FOR THE NURSE SELECT OPTION 2, PLEASE LEAVE A MESSAGE INCLUDING: YOUR NAME DATE OF BIRTH CALL BACK NUMBER REASON FOR CALL**this is important as we prioritize the call backs  YOU WILL RECEIVE A CALL BACK THE SAME DAY AS LONG AS YOU CALL BEFORE 4:00 PM

## 2024-01-11 ENCOUNTER — Other Ambulatory Visit (HOSPITAL_COMMUNITY): Payer: Self-pay

## 2024-01-21 ENCOUNTER — Other Ambulatory Visit (HOSPITAL_COMMUNITY): Payer: Self-pay | Admitting: Cardiology

## 2024-01-21 DIAGNOSIS — I5020 Unspecified systolic (congestive) heart failure: Secondary | ICD-10-CM

## 2024-01-22 ENCOUNTER — Ambulatory Visit (HOSPITAL_COMMUNITY): Payer: Self-pay | Admitting: Family Medicine

## 2024-01-22 LAB — BASIC METABOLIC PANEL WITH GFR
BUN/Creatinine Ratio: 10 (ref 10–24)
BUN: 9 mg/dL (ref 8–27)
CO2: 24 mmol/L (ref 20–29)
Calcium: 10 mg/dL (ref 8.6–10.2)
Chloride: 102 mmol/L (ref 96–106)
Creatinine, Ser: 0.89 mg/dL (ref 0.76–1.27)
Glucose: 179 mg/dL — ABNORMAL HIGH (ref 70–99)
Potassium: 3.9 mmol/L (ref 3.5–5.2)
Sodium: 143 mmol/L (ref 134–144)
eGFR: 89 mL/min/1.73 (ref >59)

## 2024-04-07 ENCOUNTER — Other Ambulatory Visit (HOSPITAL_COMMUNITY): Payer: Self-pay | Admitting: Family Medicine

## 2024-05-30 ENCOUNTER — Ambulatory Visit: Admitting: Cardiology
